# Patient Record
Sex: Female | Born: 1955 | Race: Black or African American | Hispanic: No | Marital: Married | Smoking: Never smoker
Health system: Southern US, Community
[De-identification: ages and names within clinical notes are randomized; demographics above are authoritative.]

## PROBLEM LIST (undated history)

## (undated) DIAGNOSIS — R011 Cardiac murmur, unspecified: Secondary | ICD-10-CM

## (undated) DIAGNOSIS — N189 Chronic kidney disease, unspecified: Secondary | ICD-10-CM

## (undated) DIAGNOSIS — R7303 Prediabetes: Secondary | ICD-10-CM

## (undated) DIAGNOSIS — M810 Age-related osteoporosis without current pathological fracture: Secondary | ICD-10-CM

## (undated) DIAGNOSIS — D649 Anemia, unspecified: Secondary | ICD-10-CM

## (undated) DIAGNOSIS — T7840XA Allergy, unspecified, initial encounter: Secondary | ICD-10-CM

## (undated) DIAGNOSIS — M069 Rheumatoid arthritis, unspecified: Secondary | ICD-10-CM

## (undated) HISTORY — PX: TUBAL LIGATION: SHX77

## (undated) HISTORY — DX: Age-related osteoporosis without current pathological fracture: M81.0

## (undated) HISTORY — DX: Anemia, unspecified: D64.9

## (undated) HISTORY — DX: Cardiac murmur, unspecified: R01.1

## (undated) HISTORY — DX: Allergy, unspecified, initial encounter: T78.40XA

## (undated) HISTORY — DX: Chronic kidney disease, unspecified: N18.9

---

## 1999-11-14 ENCOUNTER — Other Ambulatory Visit: Admission: RE | Admit: 1999-11-14 | Discharge: 1999-11-14 | Payer: Self-pay | Admitting: Obstetrics and Gynecology

## 2000-07-09 ENCOUNTER — Ambulatory Visit (HOSPITAL_COMMUNITY): Admission: RE | Admit: 2000-07-09 | Discharge: 2000-07-09 | Payer: Self-pay | Admitting: *Deleted

## 2000-07-09 ENCOUNTER — Encounter: Payer: Self-pay | Admitting: Rheumatology

## 2001-07-29 ENCOUNTER — Other Ambulatory Visit: Admission: RE | Admit: 2001-07-29 | Discharge: 2001-07-29 | Payer: Self-pay | Admitting: Obstetrics and Gynecology

## 2002-08-08 ENCOUNTER — Other Ambulatory Visit: Admission: RE | Admit: 2002-08-08 | Discharge: 2002-08-08 | Payer: Self-pay | Admitting: Obstetrics and Gynecology

## 2003-12-31 ENCOUNTER — Other Ambulatory Visit: Admission: RE | Admit: 2003-12-31 | Discharge: 2003-12-31 | Payer: Self-pay | Admitting: Obstetrics and Gynecology

## 2004-11-18 ENCOUNTER — Encounter: Admission: RE | Admit: 2004-11-18 | Discharge: 2004-11-18 | Payer: Self-pay | Admitting: Allergy and Immunology

## 2005-05-22 ENCOUNTER — Emergency Department (HOSPITAL_COMMUNITY): Admission: EM | Admit: 2005-05-22 | Discharge: 2005-05-22 | Payer: Self-pay | Admitting: Emergency Medicine

## 2005-09-07 ENCOUNTER — Other Ambulatory Visit: Admission: RE | Admit: 2005-09-07 | Discharge: 2005-09-07 | Payer: Self-pay | Admitting: Obstetrics and Gynecology

## 2006-11-19 ENCOUNTER — Other Ambulatory Visit: Admission: RE | Admit: 2006-11-19 | Discharge: 2006-11-19 | Payer: Self-pay | Admitting: Obstetrics and Gynecology

## 2007-11-30 ENCOUNTER — Other Ambulatory Visit: Admission: RE | Admit: 2007-11-30 | Discharge: 2007-11-30 | Payer: Self-pay | Admitting: Obstetrics and Gynecology

## 2008-02-18 ENCOUNTER — Emergency Department (HOSPITAL_COMMUNITY): Admission: EM | Admit: 2008-02-18 | Discharge: 2008-02-18 | Payer: Self-pay | Admitting: Family Medicine

## 2008-02-24 ENCOUNTER — Emergency Department (HOSPITAL_COMMUNITY): Admission: EM | Admit: 2008-02-24 | Discharge: 2008-02-25 | Payer: Self-pay | Admitting: Emergency Medicine

## 2009-06-18 ENCOUNTER — Other Ambulatory Visit: Admission: RE | Admit: 2009-06-18 | Discharge: 2009-06-18 | Payer: Self-pay | Admitting: Obstetrics and Gynecology

## 2010-04-30 ENCOUNTER — Encounter: Admission: RE | Admit: 2010-04-30 | Discharge: 2010-04-30 | Payer: Self-pay | Admitting: Allergy and Immunology

## 2011-02-06 ENCOUNTER — Other Ambulatory Visit: Payer: Self-pay | Admitting: Family Medicine

## 2011-02-06 ENCOUNTER — Ambulatory Visit
Admission: RE | Admit: 2011-02-06 | Discharge: 2011-02-06 | Disposition: A | Discharge status: Home / Self Care | Payer: BC Managed Care – PPO | Source: Ambulatory Visit | Attending: Family Medicine | Admitting: Family Medicine

## 2011-02-06 DIAGNOSIS — R109 Unspecified abdominal pain: Secondary | ICD-10-CM

## 2011-08-24 LAB — CBC
MCHC: 33
Platelets: 287
RDW: 15.2

## 2011-08-24 LAB — DIFFERENTIAL
Basophils Absolute: 0
Basophils Relative: 0
Eosinophils Relative: 5
Lymphocytes Relative: 15
Monocytes Absolute: 0.5
Neutro Abs: 6.1

## 2011-08-24 LAB — BASIC METABOLIC PANEL
BUN: 9
Chloride: 106
Creatinine, Ser: 0.72
GFR calc Af Amer: >60
GFR calc non Af Amer: >60
Potassium: 3.9

## 2011-08-24 LAB — POCT RAPID STREP A: Streptococcus, Group A Screen (Direct): POSITIVE — AB

## 2011-09-11 ENCOUNTER — Other Ambulatory Visit: Payer: Self-pay | Admitting: Obstetrics and Gynecology

## 2011-09-11 ENCOUNTER — Other Ambulatory Visit (HOSPITAL_COMMUNITY)
Admission: RE | Admit: 2011-09-11 | Discharge: 2011-09-11 | Disposition: A | Discharge status: Home / Self Care | Payer: BC Managed Care – PPO | Source: Ambulatory Visit | Attending: Obstetrics and Gynecology | Admitting: Obstetrics and Gynecology

## 2011-09-11 DIAGNOSIS — Z01419 Encounter for gynecological examination (general) (routine) without abnormal findings: Secondary | ICD-10-CM | POA: Insufficient documentation

## 2012-09-12 ENCOUNTER — Other Ambulatory Visit (HOSPITAL_COMMUNITY)
Admission: RE | Admit: 2012-09-12 | Discharge: 2012-09-12 | Disposition: A | Discharge status: Home / Self Care | Payer: BC Managed Care – PPO | Source: Ambulatory Visit | Attending: Obstetrics and Gynecology | Admitting: Obstetrics and Gynecology

## 2012-09-12 ENCOUNTER — Other Ambulatory Visit: Payer: Self-pay | Admitting: Obstetrics and Gynecology

## 2012-09-12 DIAGNOSIS — Z01419 Encounter for gynecological examination (general) (routine) without abnormal findings: Secondary | ICD-10-CM | POA: Insufficient documentation

## 2013-09-20 ENCOUNTER — Other Ambulatory Visit (HOSPITAL_COMMUNITY)
Admission: RE | Admit: 2013-09-20 | Discharge: 2013-09-20 | Disposition: A | Discharge status: Home / Self Care | Payer: BC Managed Care – PPO | Source: Ambulatory Visit | Attending: Obstetrics and Gynecology | Admitting: Obstetrics and Gynecology

## 2013-09-20 ENCOUNTER — Other Ambulatory Visit: Payer: Self-pay | Admitting: Obstetrics and Gynecology

## 2013-09-20 DIAGNOSIS — Z1151 Encounter for screening for human papillomavirus (HPV): Secondary | ICD-10-CM | POA: Insufficient documentation

## 2013-09-20 DIAGNOSIS — Z01419 Encounter for gynecological examination (general) (routine) without abnormal findings: Secondary | ICD-10-CM | POA: Insufficient documentation

## 2014-04-01 ENCOUNTER — Encounter (HOSPITAL_COMMUNITY): Payer: Self-pay | Admitting: Emergency Medicine

## 2014-04-01 ENCOUNTER — Emergency Department (HOSPITAL_COMMUNITY)
Admission: EM | Admit: 2014-04-01 | Discharge: 2014-04-01 | Disposition: A | Discharge status: Home / Self Care | Payer: BC Managed Care – PPO | Source: Home / Self Care | Attending: Family Medicine | Admitting: Family Medicine

## 2014-04-01 DIAGNOSIS — J40 Bronchitis, not specified as acute or chronic: Secondary | ICD-10-CM

## 2014-04-01 DIAGNOSIS — I889 Nonspecific lymphadenitis, unspecified: Secondary | ICD-10-CM

## 2014-04-01 HISTORY — DX: Rheumatoid arthritis, unspecified: M06.9

## 2014-04-01 MED ORDER — SODIUM CHLORIDE 0.9 % IN NEBU
INHALATION_SOLUTION | RESPIRATORY_TRACT | Status: AC
  Filled 2014-04-01: qty 3

## 2014-04-01 MED ORDER — PREDNISONE 10 MG PO TABS: 30.0000 mg | ORAL_TABLET | Freq: Every day | ORAL | Status: DC

## 2014-04-01 MED ORDER — IPRATROPIUM-ALBUTEROL 0.5-2.5 (3) MG/3ML IN SOLN
RESPIRATORY_TRACT | Status: AC
  Filled 2014-04-01: qty 3

## 2014-04-01 MED ORDER — CLINDAMYCIN HCL 300 MG PO CAPS: 300.0000 mg | ORAL_CAPSULE | Freq: Three times a day (TID) | ORAL | Status: DC

## 2014-04-01 MED ORDER — ALBUTEROL SULFATE (2.5 MG/3ML) 0.083% IN NEBU: 2.5000 mg | INHALATION_SOLUTION | Freq: Four times a day (QID) | RESPIRATORY_TRACT | Status: AC | PRN

## 2014-04-01 MED ORDER — IPRATROPIUM-ALBUTEROL 0.5-2.5 (3) MG/3ML IN SOLN
3.0000 mL | Freq: Once | RESPIRATORY_TRACT | Status: AC
  Administered 2014-04-01: 3 mL via RESPIRATORY_TRACT

## 2014-04-01 NOTE — ED Provider Notes (Signed)
Taylor Kirby is a 58 y.o. female who presents to Urgent Care today for sore throat left ear pain wheezing and shortness of breath. This is associated with a mild productive cough. Symptoms present for about one week. Patient has tried albuterol nebulizer which helps some. Additionally she's tried ibuprofen which helps a little. She denies any fevers or chills nausea vomiting or diarrhea.   Past Medical History  Diagnosis Date  . Rheumatoid arthritis    History  Substance Use Topics  . Smoking status: Never Smoker   . Smokeless tobacco: Not on file  . Alcohol Use: No   ROS as above Medications: No current facility-administered medications for this encounter.   Current Outpatient Prescriptions  Medication Sig Dispense Refill  . etanercept (ENBREL) 50 MG/ML injection Inject 50 mg into the skin once a week.      Marland Kitchen FOLIC ACID PO Take by mouth.      . Methotrexate Sodium (METHOTREXATE PO) Take by mouth.      Marland Kitchen albuterol (PROVENTIL) (2.5 MG/3ML) 0.083% nebulizer solution Take 3 mLs (2.5 mg total) by nebulization every 6 (six) hours as needed for wheezing or shortness of breath.  75 mL  0  . clindamycin (CLEOCIN) 300 MG capsule Take 1 capsule (300 mg total) by mouth 3 (three) times daily.  30 capsule  0  . predniSONE (DELTASONE) 10 MG tablet Take 3 tablets (30 mg total) by mouth daily.  15 tablet  0    Exam:  BP 130/90  Pulse 88  Temp(Src) 98.6 F (37 C) (Oral)  Resp 17  SpO2 100% Gen: Well NAD HEENT: EOMI,  MMM left tender anterior cervical lymph nodes. Posterior pharynx with cobblestoning. Normal tympanic membranes bilaterally Lungs: Normal work of breathing. Wheezing bilaterally Heart: RRR no MRG Abd: NABS, Soft. NT, ND Exts: Brisk capillary refill, warm and well perfused.   Patient was given a DuoNeb nebulizer treatment and felt much better.  No results found for this or any previous visit (from the past 24 hour(s)). No results found.  Assessment and Plan: 58 y.o.  female with  1) asthmatic bronchitis. Plan to treat with albuterol and prednisone 2) lymphadenitis. Plan to treat with clindamycin  Discussed warning signs or symptoms. Please see discharge instructions. Patient expresses understanding.    Gregor Hams, MD 04/01/14 [phone]

## 2014-04-01 NOTE — ED Notes (Signed)
Breathing treatment complete.  States feeling much better.

## 2014-04-01 NOTE — ED Notes (Signed)
C/O voice hoarseness and left earache.  Assessment per Dr. Georgina Snell.

## 2014-10-08 ENCOUNTER — Other Ambulatory Visit: Payer: Self-pay | Admitting: Obstetrics and Gynecology

## 2014-10-08 ENCOUNTER — Other Ambulatory Visit (HOSPITAL_COMMUNITY)
Admission: RE | Admit: 2014-10-08 | Discharge: 2014-10-08 | Disposition: A | Discharge status: Home / Self Care | Payer: BC Managed Care – PPO | Source: Ambulatory Visit | Attending: Obstetrics and Gynecology | Admitting: Obstetrics and Gynecology

## 2014-10-08 DIAGNOSIS — Z01419 Encounter for gynecological examination (general) (routine) without abnormal findings: Secondary | ICD-10-CM | POA: Insufficient documentation

## 2014-10-10 LAB — CYTOLOGY - PAP

## 2015-01-27 ENCOUNTER — Emergency Department (HOSPITAL_COMMUNITY)
Admission: EM | Admit: 2015-01-27 | Discharge: 2015-01-27 | Disposition: A | Discharge status: Home / Self Care | Payer: BC Managed Care – PPO | Source: Home / Self Care | Attending: Family Medicine | Admitting: Family Medicine

## 2015-01-27 ENCOUNTER — Encounter (HOSPITAL_COMMUNITY): Payer: Self-pay | Admitting: Emergency Medicine

## 2015-01-27 DIAGNOSIS — J00 Acute nasopharyngitis [common cold]: Secondary | ICD-10-CM

## 2015-01-27 MED ORDER — BENZONATATE 100 MG PO CAPS: 100.0000 mg | ORAL_CAPSULE | Freq: Three times a day (TID) | ORAL | Status: DC | PRN

## 2015-01-27 MED ORDER — IPRATROPIUM BROMIDE 0.06 % NA SOLN: 2.0000 | Freq: Four times a day (QID) | NASAL | Status: DC

## 2015-01-27 NOTE — Discharge Instructions (Signed)
Upper Respiratory Infection, Adult An upper respiratory infection (URI) is also sometimes known as the common cold. The upper respiratory tract includes the nose, sinuses, throat, trachea, and bronchi. Bronchi are the airways leading to the lungs. Most people improve within 1 week, but symptoms can last up to 2 weeks. A residual cough may last even longer.  CAUSES Many different viruses can infect the tissues lining the upper respiratory tract. The tissues become irritated and inflamed and often become very moist. Mucus production is also common. A cold is contagious. You can easily spread the virus to others by oral contact. This includes kissing, sharing a glass, coughing, or sneezing. Touching your mouth or nose and then touching a surface, which is then touched by another person, can also spread the virus. SYMPTOMS  Symptoms typically develop 1 to 3 days after you come in contact with a cold virus. Symptoms vary from person to person. They may include:  Runny nose.  Sneezing.  Nasal congestion.  Sinus irritation.  Sore throat.  Loss of voice (laryngitis).  Cough.  Fatigue.  Muscle aches.  Loss of appetite.  Headache.  Low-grade fever. DIAGNOSIS  You might diagnose your own cold based on familiar symptoms, since most people get a cold 2 to 3 times a year. Your caregiver can confirm this based on your exam. Most importantly, your caregiver can check that your symptoms are not due to another disease such as strep throat, sinusitis, pneumonia, asthma, or epiglottitis. Blood tests, throat tests, and X-rays are not necessary to diagnose a common cold, but they may sometimes be helpful in excluding other more serious diseases. Your caregiver will decide if any further tests are required. RISKS AND COMPLICATIONS  You may be at risk for a more severe case of the common cold if you smoke cigarettes, have chronic heart disease (such as heart failure) or lung disease (such as asthma), or if  you have a weakened immune system. The very young and very old are also at risk for more serious infections. Bacterial sinusitis, middle ear infections, and bacterial pneumonia can complicate the common cold. The common cold can worsen asthma and chronic obstructive pulmonary disease (COPD). Sometimes, these complications can require emergency medical care and may be life-threatening. PREVENTION  The best way to protect against getting a cold is to practice good hygiene. Avoid oral or hand contact with people with cold symptoms. Wash your hands often if contact occurs. There is no clear evidence that vitamin C, vitamin E, echinacea, or exercise reduces the chance of developing a cold. However, it is always recommended to get plenty of rest and practice good nutrition. TREATMENT  Treatment is directed at relieving symptoms. There is no cure. Antibiotics are not effective, because the infection is caused by a virus, not by bacteria. Treatment may include:  Increased fluid intake. Sports drinks offer valuable electrolytes, sugars, and fluids.  Breathing heated mist or steam (vaporizer or shower).  Eating chicken soup or other clear broths, and maintaining good nutrition.  Getting plenty of rest.  Using gargles or lozenges for comfort.  Controlling fevers with ibuprofen or acetaminophen as directed by your caregiver.  Increasing usage of your inhaler if you have asthma. Zinc gel and zinc lozenges, taken in the first 24 hours of the common cold, can shorten the duration and lessen the severity of symptoms. Pain medicines may help with fever, muscle aches, and throat pain. A variety of non-prescription medicines are available to treat congestion and runny nose. Your caregiver   can make recommendations and may suggest nasal or lung inhalers for other symptoms.  HOME CARE INSTRUCTIONS   Only take over-the-counter or prescription medicines for pain, discomfort, or fever as directed by your  caregiver.  Use a warm mist humidifier or inhale steam from a shower to increase air moisture. This may keep secretions moist and make it easier to breathe.  Drink enough water and fluids to keep your urine clear or pale yellow.  Rest as needed.  Return to work when your temperature has returned to normal or as your caregiver advises. You may need to stay home longer to avoid infecting others. You can also use a face mask and careful hand washing to prevent spread of the virus. SEEK MEDICAL CARE IF:   After the first few days, you feel you are getting worse rather than better.  You need your caregiver's advice about medicines to control symptoms.  You develop chills, worsening shortness of breath, or brown or red sputum. These may be signs of pneumonia.  You develop yellow or brown nasal discharge or pain in the face, especially when you bend forward. These may be signs of sinusitis.  You develop a fever, swollen neck glands, pain with swallowing, or white areas in the back of your throat. These may be signs of strep throat. SEEK IMMEDIATE MEDICAL CARE IF:   You have a fever.  You develop severe or persistent headache, ear pain, sinus pain, or chest pain.  You develop wheezing, a prolonged cough, cough up blood, or have a change in your usual mucus (if you have chronic lung disease).  You develop sore muscles or a stiff neck. Document Released: 05/12/2001 Document Revised: 02/08/2012 Document Reviewed: 02/21/2014 ExitCare Patient Information 2015 [employer]. This information is not intended to replace advice given to you by your health care provider. Make sure you discuss any questions you have with your health care provider.  

## 2015-01-27 NOTE — ED Notes (Signed)
Chronic sinus issues, but is concerned cold is moving into chest.  C/o coughing and wheezing.  History of bronchitis.  Onset Friday 2/26.

## 2015-01-27 NOTE — ED Provider Notes (Signed)
CSN: [id number]     Arrival date & time 01/27/15  0904 History   First MD Initiated Contact with Patient 01/27/15 [phone]     Chief Complaint  Patient presents with  . 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 female presenting with URI. The history is provided by the patient.  URI Presenting symptoms: congestion, cough and rhinorrhea   Presenting symptoms: no ear pain, no facial pain, no fatigue, no fever and no sore throat   Presenting symptoms comment:  +post nasal drainage Severity:  Mild Onset quality:  Gradual Duration:  2 days Timing:  Constant Progression:  Unchanged Chronicity:  New   Past Medical History  Diagnosis Date  . Rheumatoid arthritis    Past Surgical History  Procedure Laterality Date  . Tubal ligation     No family history on file. History  Substance Use Topics  . Smoking status: Never Smoker   . Smokeless tobacco: Not on file  . Alcohol Use: No   OB History    No data available     Review of Systems  Constitutional: Negative for fever and fatigue.  HENT: Positive for congestion and rhinorrhea. Negative for ear pain and sore throat.   Eyes: Negative.   Respiratory: Positive for cough.   Cardiovascular: Negative.   Skin: Negative.     Allergies  Penicillins and Sulfa antibiotics  Home Medications   Prior to Admission medications   Medication Sig Start Date End Date Taking? Authorizing Provider  albuterol (PROVENTIL) (2.5 MG/3ML) 0.083% nebulizer solution Take 3 mLs (2.5 mg total) by nebulization every 6 (six) hours as needed for wheezing or shortness of breath. 04/01/14   Gregor Hams, MD  benzonatate (TESSALON) 100 MG capsule Take 1 capsule (100 mg total) by mouth 3 (three) times daily as needed for cough. 01/27/15   Audelia Hives Ranada Vigorito, PA  clindamycin (CLEOCIN) 300 MG capsule Take  1 capsule (300 mg total) by mouth 3 (three) times daily. 04/01/14   Gregor Hams, MD  etanercept (ENBREL) 50 MG/ML injection Inject 50 mg into the skin once a week.    Historical Provider, MD  FOLIC ACID PO Take by mouth.    Historical Provider, MD  ipratropium (ATROVENT) 0.06 % nasal spray Place 2 sprays into both nostrils 4 (four) times daily. 01/27/15   Audelia Hives Elizibeth Breau, PA  Methotrexate Sodium (METHOTREXATE PO) Take by mouth.    Historical Provider, MD  predniSONE (DELTASONE) 10 MG tablet Take 3 tablets (30 mg total) by mouth daily. Patient not taking: Reported on 01/27/2015 04/01/14   Gregor Hams, MD   BP 123/81 mmHg  Pulse 81  Temp(Src) 98 F (36.7 C) (Oral)  Resp 16  SpO2 99% Physical Exam  Constitutional: She is oriented to person, place, and time. She appears well-developed and well-nourished. No distress.  HENT:  Head: Normocephalic and atraumatic.  Right Ear: Hearing, tympanic membrane, external ear and ear canal normal.  Left Ear: Hearing, tympanic membrane, external ear and ear canal normal.  Nose: Nose normal.  Mouth/Throat: Uvula is midline, oropharynx is clear and moist and mucous membranes are normal.  Eyes: Conjunctivae are normal. No scleral icterus.  Neck: Normal range of motion. Neck supple.  Cardiovascular: Normal rate, regular rhythm and normal heart sounds.   Pulmonary/Chest: Effort normal and breath sounds normal. No respiratory distress. She has no wheezes.  Musculoskeletal: Normal range  of motion.  Lymphadenopathy:    She has no cervical adenopathy.  Neurological: She is alert and oriented to person, place, and time.  Skin: Skin is warm and dry. No rash noted. No erythema.  Psychiatric: She has a normal mood and affect. Her behavior is normal.  Nursing note and vitals reviewed.   ED Course  Procedures (including critical care time) Labs Review Labs Reviewed - No data to display  Imaging Review No results found.   MDM   1. Common cold    symptomatic care at home Tessalon atrovent nasal spray  Follow up prn with PCP    Lutricia Feil, PA 01/27/15 1005

## 2015-10-14 ENCOUNTER — Other Ambulatory Visit: Payer: Self-pay | Admitting: Obstetrics and Gynecology

## 2015-10-14 ENCOUNTER — Other Ambulatory Visit (HOSPITAL_COMMUNITY)
Admission: RE | Admit: 2015-10-14 | Discharge: 2015-10-14 | Disposition: A | Discharge status: Home / Self Care | Payer: BC Managed Care – PPO | Source: Ambulatory Visit | Attending: Obstetrics and Gynecology | Admitting: Obstetrics and Gynecology

## 2015-10-14 DIAGNOSIS — Z01419 Encounter for gynecological examination (general) (routine) without abnormal findings: Secondary | ICD-10-CM | POA: Diagnosis present

## 2015-10-15 LAB — CYTOLOGY - PAP

## 2015-10-25 ENCOUNTER — Ambulatory Visit (INDEPENDENT_AMBULATORY_CARE_PROVIDER_SITE_OTHER): Payer: BC Managed Care – PPO | Admitting: Family Medicine

## 2015-10-25 VITALS — BP 104/62 | HR 78 | Temp 98.6°F | Resp 18 | Ht 66.0 in | Wt 129.0 lb

## 2015-10-25 DIAGNOSIS — L4050 Arthropathic psoriasis, unspecified: Secondary | ICD-10-CM | POA: Insufficient documentation

## 2015-10-25 DIAGNOSIS — K1120 Sialoadenitis, unspecified: Secondary | ICD-10-CM

## 2015-10-25 DIAGNOSIS — S29019A Strain of muscle and tendon of unspecified wall of thorax, initial encounter: Secondary | ICD-10-CM | POA: Diagnosis not present

## 2015-10-25 NOTE — Patient Instructions (Signed)
Can also suck on sour candies Come back in to see [place] if you develop a fever or swelling or more pain.   Parotitis Parotitis is soreness and inflammation of one or both parotid glands. The parotid glands produce saliva. They are located on each side of the face, below and in front of the earlobes. The saliva produced comes out of tiny openings (ducts) inside the cheeks. In most cases, parotitis goes away over time or with treatment. If your parotitis is caused by certain long-term (chronic) diseases, it may come back again.  CAUSES  Parotitis can be caused by:  Viral infections. Mumps is one viral infection that can cause parotitis.  Bacterial infections.  Blockage of the salivary ducts due to a salivary stone.  Narrowing of the salivary ducts.  Swelling of the salivary ducts.  Dehydration.  Autoimmune conditions, such as sarcoidosis or Sjogren syndrome.  Air from activities such as scuba diving, glass blowing, or playing an instrument (rare).  Human immunodeficiency virus (HIV) or acquired immunodeficiency syndrome (AIDS).  Tuberculosis. SIGNS AND SYMPTOMS   The ears may appear to be pushed up and out from their normal position.  Redness (erythema) of the skin over the parotid glands.  Pain and tenderness over the parotid glands.  Swelling in the parotid gland area.  Yellowish-white fluid (pus) coming from the ducts inside the cheeks.  Dry mouth.  Bad taste in the mouth. DIAGNOSIS  Your health care provider may determine that you have parotitis based on your symptoms and a physical exam. A sample of fluid may also be taken from the parotid gland and tested to find the cause of your infection. X-rays or computed tomography (CT) scans may be taken if your health care provider thinks you might have a salivary stone blocking your salivary duct. TREATMENT  Treatment varies depending upon the cause of your parotitis. If your parotitis is caused by mumps, no treatment is needed.  The condition will go away on its own after 7 to 10 days. In other cases, treatment may include:  Antibiotic medicine if your infection was caused by bacteria.  Pain medicines.  Gland massage.  Eating sour candy to increase your saliva production.  Removal of salivary stones. Your health care provider may flush stones out with fluids or remove them with tweezers.  Surgery to remove the parotid glands. HOME CARE INSTRUCTIONS   If you were prescribed an antibiotic medicine, finish it all even if you start to feel better.  Put warm compresses on the sore area.  Take medicines only as directed by your health care provider.  Drink enough fluids to keep your urine clear or pale yellow. SEEK IMMEDIATE MEDICAL CARE IF:   You have increasing pain or swelling that is not controlled with medicine.  You have a fever. MAKE SURE YOU:  Understand these instructions.  Will watch your condition.  Will get help right away if you are not doing well or get worse.   This information is not intended to replace advice given to you by your health care provider. Make sure you discuss any questions you have with your health care provider.   Document Released: 05/08/2002 Document Revised: 12/07/2014 Document Reviewed: 04/11/2015 Elsevier Interactive Patient Education [employer].

## 2015-10-25 NOTE — Progress Notes (Signed)
Subjective:    Patient ID: Taylor Kirby, female    DOB: 1956/07/02, 59 y.o.   MRN: [id number]  HPI  This is a pleasant 59 yo female who presents today with right sided flank pain for 1 week. It is not always in the same place. The pain is intermittent and throbbing, improvement with tylenol, heat and stretching. Yesterday was particularly painful and she had some tylenol with good relief. 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  Not as painful today and has not needed pain medication. No known trauma, no falls. Has had kidney stones in the past, this does not feel like that. No hematuria. No fevers.   Has had a scratchy throat and loss of voice for a couple of days. She had some pain in front of her left ear. She has noticed a bad taste and fluid in her mouth. She felt some swelling under her neck which has resolved.   Sees Dr. Humberto Seals every 6 weeks for her psoriatic arthritis. Had recent labs which were normal.   Past Medical History  Diagnosis Date  . Rheumatoid arthritis ([place])   . Allergy   . Anemia   . Heart murmur    Past Surgical History  Procedure Laterality Date  . Tubal ligation     Family History  Problem Relation Age of Onset  . Hyperlipidemia Mother   . Hypertension Mother   . Stroke Mother   . Hyperlipidemia Father   . Cancer Father   . Hypertension Sister   . Heart disease Maternal Grandmother   . Cancer Maternal Grandfather   . Hypertension Paternal Grandmother    Social History  Substance Use Topics  . Smoking status: Never Smoker   . Smokeless tobacco: None  . Alcohol Use: No   Review of Systems No fever or chills, no hematuria, no dysuria, no frequency. No ear pain, no cough.     Objective:   Physical Exam  Constitutional: She is oriented to person, place, and time. She appears well-developed and well-nourished.  thin  HENT:  Head: Normocephalic and atraumatic.  Right Ear: External ear  normal.  Left Ear: External ear normal.  Nose: Nose normal.  Mouth/Throat: Uvula is midline and mucous membranes are normal. No oropharyngeal exudate, posterior oropharyngeal edema or posterior oropharyngeal erythema.  Left parotid gland opening with small amount white discharge. No swelling.   Eyes: Conjunctivae are normal.  Neck: Normal range of motion. Neck supple.  Cardiovascular: Normal rate and regular rhythm.   Pulmonary/Chest: Effort normal.  Musculoskeletal: Normal range of motion.  Right lateral rib edge tender to palpation, no pain with twisting.   Lymphadenopathy:    She has no cervical adenopathy.  Neurological: She is alert and oriented to person, place, and time.  Skin: Skin is warm and dry.  Psychiatric: She has a normal mood and affect. Her behavior is normal. Judgment and thought content normal.  Vitals reviewed.  BP 104/62 mmHg  Pulse 78  Temp(Src) 98.6 F (37 C) (Oral)  Resp 18  Ht 5\' 6"  (1.676 m)  Wt 129 lb (58.514 kg)  BMI 20.83 kg/m2  SpO2 98%     Assessment & Plan:  1. Parotitis - this is draining and she is afebrile and swelling has gone down.  - Provided written and verbal information regarding diagnosis and treatment. - RTC precautions- fever, increased swelling or pain  2. Strain of muscle of torso, initial encounter -  Improved today, continue symptomatic treatment- Tylenol, heat, gentle ROM - RTC if she develops worsening pain or urinary symptoms   Clarene Reamer, FNP-BC  [facility]  10/25/2015 5:56 PM

## 2015-10-26 ENCOUNTER — Ambulatory Visit: Payer: BC Managed Care – PPO

## 2016-02-29 ENCOUNTER — Encounter (HOSPITAL_COMMUNITY): Payer: Self-pay | Admitting: Emergency Medicine

## 2016-02-29 ENCOUNTER — Emergency Department (HOSPITAL_COMMUNITY)
Admission: EM | Admit: 2016-02-29 | Discharge: 2016-02-29 | Disposition: A | Discharge status: Home / Self Care | Payer: BC Managed Care – PPO | Attending: Emergency Medicine | Admitting: Emergency Medicine

## 2016-02-29 ENCOUNTER — Emergency Department (HOSPITAL_COMMUNITY): Payer: BC Managed Care – PPO

## 2016-02-29 DIAGNOSIS — D649 Anemia, unspecified: Secondary | ICD-10-CM | POA: Diagnosis not present

## 2016-02-29 DIAGNOSIS — R059 Cough, unspecified: Secondary | ICD-10-CM

## 2016-02-29 DIAGNOSIS — R011 Cardiac murmur, unspecified: Secondary | ICD-10-CM | POA: Insufficient documentation

## 2016-02-29 DIAGNOSIS — J069 Acute upper respiratory infection, unspecified: Secondary | ICD-10-CM | POA: Diagnosis not present

## 2016-02-29 DIAGNOSIS — Z880 Allergy status to penicillin: Secondary | ICD-10-CM | POA: Diagnosis not present

## 2016-02-29 DIAGNOSIS — R05 Cough: Secondary | ICD-10-CM | POA: Diagnosis present

## 2016-02-29 DIAGNOSIS — Z79899 Other long term (current) drug therapy: Secondary | ICD-10-CM | POA: Insufficient documentation

## 2016-02-29 DIAGNOSIS — Z8739 Personal history of other diseases of the musculoskeletal system and connective tissue: Secondary | ICD-10-CM | POA: Diagnosis not present

## 2016-02-29 MED ORDER — BENZONATATE 100 MG PO CAPS: 100.0000 mg | ORAL_CAPSULE | Freq: Three times a day (TID) | ORAL | Status: DC

## 2016-02-29 NOTE — ED Notes (Signed)
Pt complaint of nonproductive cough unrelieved by Musinex onset Wednesday.

## 2016-02-29 NOTE — ED Provider Notes (Signed)
CSN: [id number]     Arrival date & time 02/29/16  1511 History  By signing my name below, I, Taylor Kirby, attest that this documentation has been prepared under the direction and in the presence of non-physician practitioner, Delrae Rend, PA-C. Electronically Signed: Dora Kirby, Scribe. 02/29/2016. 3:33 PM.   Chief Complaint  Patient presents with  . Cough    The history is provided by the patient. No language interpreter was used.     HPI Comments: Taylor Kirby is a 60 y.o. female with h/o RA who presents to the Emergency Department complaining of sudden onset, constant, URI symptoms for the past five days. Pt endorses dry cough, chest tightness, chest congestion, nasal congestion, throat drainage, myalgias, fever, and sore throat. She notes that her fever and nasal congestion have ceased; her fever ended a few days ago and she used OTC nasal spray today with relief of her nasal congestion. Pt notes that she went to a walk-in clinic five days ago and had a fever of 100.7 measured; she was given amoxicillin and had a chest x-ray taken. Pt has finished her amoxicillin prescription. Pt was also given Tamiflu at the same clinic two weeks ago. Pt has been taking ibuprofen and Tylenol for her symptoms; her last use of either was two days ago. She denies chest pain, SOB, abd pain, vomiting, or any other associated symptoms.  Past Medical History  Diagnosis Date  . Rheumatoid arthritis ([place])   . Allergy   . Anemia   . Heart murmur    Past Surgical History  Procedure Laterality Date  . Tubal ligation     Family History  Problem Relation Age of Onset  . Hyperlipidemia Mother   . Hypertension Mother   . Stroke Mother   . Hyperlipidemia Father   . Cancer Father   . Hypertension Sister   . Heart disease Maternal Grandmother   . Cancer Maternal Grandfather   . Hypertension Paternal Grandmother    Social History  Substance Use Topics  . Smoking status: Never Smoker   . Smokeless  tobacco: None  . Alcohol Use: No   OB History    No data available     Review of Systems  Constitutional: Positive for fever.  HENT: Positive for congestion, postnasal drip and sore throat.   Respiratory: Positive for cough and chest tightness. Negative for shortness of breath.   Gastrointestinal: Negative for vomiting.  Musculoskeletal: Positive for myalgias.  All other systems reviewed and are negative.     Allergies  Penicillins and Sulfa antibiotics  Home Medications   Prior to Admission medications   Medication Sig Start Date End Date Taking? Authorizing Provider  albuterol (PROVENTIL) (2.5 MG/3ML) 0.083% nebulizer solution Take 3 mLs (2.5 mg total) by nebulization every 6 (six) hours as needed for wheezing or shortness of breath. Patient not taking: Reported on 10/25/2015 04/01/14   Gregor Hams, MD  etanercept (ENBREL) 50 MG/ML injection Inject 50 mg into the skin once a week.    Historical Provider, MD  folic acid (FOLVITE) 1 MG tablet Take 1 mg by mouth daily.    Historical Provider, MD  methotrexate (RHEUMATREX) 2.5 MG tablet Take 2.5 mg by mouth once a week. Caution:Chemotherapy. Protect from light.    Historical Provider, MD   BP 118/81 mmHg  Pulse 97  Temp(Src) 98.1 F (36.7 C) (Oral)  Resp 18  SpO2 100% Physical Exam  Constitutional: She is oriented to person, place, and time. She appears well-developed  and well-nourished. No distress.  HENT:  Head: Normocephalic and atraumatic.  Right Ear: External ear normal.  Left Ear: External ear normal.  Nose: Nose normal.  Mouth/Throat: Oropharynx is clear and moist. No oropharyngeal exudate.  Eyes: Conjunctivae and EOM are normal. Pupils are equal, round, and reactive to light.  Neck: Normal range of motion. Neck supple. No tracheal deviation present.  Cardiovascular: Normal rate, regular rhythm and normal heart sounds.   Pulmonary/Chest: Effort normal and breath sounds normal. No respiratory distress. She has no  wheezes. She has no rales.  Abdominal: Soft. Bowel sounds are normal. She exhibits no distension. There is no tenderness.  Musculoskeletal: Normal range of motion.  Lymphadenopathy:    She has no cervical adenopathy.  Neurological: She is alert and oriented to person, place, and time.  Skin: Skin is warm and dry.  Psychiatric: She has a normal mood and affect. Her behavior is normal.  Nursing note and vitals reviewed.   ED Course  Procedures (including critical care time)  DIAGNOSTIC STUDIES: Oxygen Saturation is 100% on RA, normal by my interpretation.    COORDINATION OF CARE: 3:38 PM Will order DG Chest 2 View. Discussed treatment plan with pt at bedside and pt agreed to plan.  Labs Review Labs Reviewed - No data to display  Imaging Review Dg Chest 2 View  02/29/2016  CLINICAL DATA:  Cough. EXAM: CHEST  2 VIEW COMPARISON:  February 24, 2016. FINDINGS: The heart size and mediastinal contours are within normal limits. Both lungs are clear. The visualized skeletal structures are unremarkable. IMPRESSION: No active cardiopulmonary disease. Electronically Signed   By: Marijo Conception, M.D.   On: 02/29/2016 16:22   I have personally reviewed and evaluated these images as part of my medical decision-making.   EKG Interpretation None      MDM   Final diagnoses:  URI (upper respiratory infection)  Cough    CXR negative. Suspect viral etiology. Given recent abx, no pneumonia on x-ray, no fever or adventitious lung sounds, will hold off on another abx at this time. Rx given for tessalon. Instructed to continue Zyrtec and Mucinex at home. Instructed to f/u with PCP and rheumatologist next week. ER return precautions given.   I personally performed the services described in this documentation, which was scribed in my presence. The recorded information has been reviewed and is accurate.   Anne Ng, PA-C 02/29/16 [place], MD 03/01/16 [phone]

## 2016-02-29 NOTE — Discharge Instructions (Signed)
Your chest x-ray was normal today. You likely have a virus. I will hold off on prescribing anymore antibiotics at this time. I will give you a prescription for a non-drowsy cough medicine to try. As we discussed, continue taking Zyrtec and Mucinex that you have at home to help with your congestion and drainage. Please follow up with your primary care and rheumatologist within one week. Return to the ER for new or worsening symptoms.

## 2016-02-29 NOTE — ED Notes (Signed)
Pt reports understanding of discharge information. No questions at time of discharge 

## 2016-09-30 ENCOUNTER — Other Ambulatory Visit: Payer: Self-pay | Admitting: Rheumatology

## 2016-09-30 NOTE — Telephone Encounter (Signed)
Last visit 05/05/16 Next visit 10/06/16 Labs 07/03/16 WNL TB neg 03/2016  Ok to refill per Dr Estanislado Pandy

## 2016-10-03 DIAGNOSIS — Z79899 Other long term (current) drug therapy: Secondary | ICD-10-CM | POA: Insufficient documentation

## 2016-10-05 DIAGNOSIS — N29 Other disorders of kidney and ureter in diseases classified elsewhere: Secondary | ICD-10-CM

## 2016-10-05 DIAGNOSIS — D649 Anemia, unspecified: Secondary | ICD-10-CM | POA: Insufficient documentation

## 2016-10-05 DIAGNOSIS — M24521 Contracture, right elbow: Secondary | ICD-10-CM | POA: Insufficient documentation

## 2016-10-05 DIAGNOSIS — M8580 Other specified disorders of bone density and structure, unspecified site: Secondary | ICD-10-CM | POA: Insufficient documentation

## 2016-10-05 NOTE — Progress Notes (Signed)
*IMAGE* Office Visit Note  Patient: Taylor Kirby             Date of Birth: 1956/05/27           MRN: [id number]             PCP: Harlan Stains M.D. Referring: No ref. provider found Visit Date: 10/06/2016 Occupation: Retired Pharmacist, hospital    Subjective:  Follow-up on psoriatic arthritis  History of Present Illness: Taylor Kirby is a 60 y.o. female with psoriatic arthritis. She states she has no joint pain and no joint swelling. She's been tolerating her medications well she is on Enbrel once a week.  Activities of Daily Living:  Patient reports morning stiffness for 0 minutes.   Patient Denies nocturnal pain.  Difficulty dressing/grooming: Denies Difficulty climbing stairs: Denies Difficulty getting out of chair: Denies Difficulty using hands for taps, buttons, cutlery, and/or writing: Denies   Review of Systems  Constitutional: Negative for fatigue, night sweats, weight gain, weight loss and weakness.  HENT: Negative for mouth sores, trouble swallowing, trouble swallowing, mouth dryness and nose dryness.   Eyes: Negative for pain, redness, visual disturbance and dryness.  Respiratory: Negative for cough, shortness of breath and difficulty breathing.   Cardiovascular: Negative for chest pain, palpitations, hypertension, irregular heartbeat and swelling in legs/feet.  Gastrointestinal: Negative for blood in stool, constipation and diarrhea.  Endocrine: Negative for increased urination.  Genitourinary: Negative for vaginal dryness.  Musculoskeletal: Negative for arthralgias, joint pain, joint swelling, myalgias, muscle weakness, morning stiffness, muscle tenderness and myalgias.  Skin: Negative for color change, rash, hair loss, skin tightness, ulcers and sensitivity to sunlight.  Allergic/Immunologic: Negative for susceptible to infections.  Neurological: Negative for dizziness, memory loss and night sweats.  Hematological: Negative for swollen glands.  Psychiatric/Behavioral:  Negative for depressed mood and sleep disturbance. The patient is not nervous/anxious.     PMFS History:  Patient Active Problem List   Diagnosis Date Noted  . 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 Anemia 10/05/2016  . Osteopenia 10/05/2016  . 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 High risk medication use 10/03/2016  . Psoriatic arthritis ([place]) 10/25/2015    Past Medical History:  Diagnosis Date  . Allergy   . Anemia   . Heart murmur   . Rheumatoid arthritis ([place])     Family History  Problem Relation Age of Onset  . Hyperlipidemia Mother   . Hypertension Mother   . Stroke Mother   . Hyperlipidemia Father   . Cancer Father   . Hypertension Sister   . Heart disease Maternal Grandmother   . Cancer Maternal Grandfather   . Hypertension Paternal Grandmother    Past Surgical History:  Procedure Laterality Date  . TUBAL LIGATION     Social History   Social History Narrative  . No narrative on file     Objective: Vital Signs: BP 128/81 (BP Location: Left Arm, Patient Position: Sitting, Cuff Size: Large)   Pulse 75   Resp 13   Ht 5\' 6"  (1.676 m)   Wt 128 lb (58.1 kg)   BMI 20.66 kg/m    Physical Exam  Constitutional: She is oriented to person, place, and time. She appears well-developed and well-nourished.  HENT:  Head: Normocephalic and atraumatic.  Eyes: Conjunctivae and EOM are normal.  Neck: Normal range of motion.  Cardiovascular: Normal rate, regular rhythm, normal heart sounds and intact distal pulses.   Pulmonary/Chest: Effort normal and breath sounds normal.  Abdominal: Soft. Bowel  sounds are normal.  Lymphadenopathy:    She has no cervical adenopathy.  Neurological: She is alert and oriented to person, place, and time.  Skin: Skin is warm and dry. Capillary refill takes less than 2 seconds.  Psychiatric: She has a normal mood and affect. Her behavior is normal.  Nursing note and vitals reviewed.    Musculoskeletal Exam: C-spine and thoracic  lumbar spine good range of motion. She is somewhat limitation of forward flexion of her lumbar spine due to tight hamstrings. 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 Her hip joints knee joints ankles MTPs PIPs with good range of motion with no synovitis.  CDAI Exam: No CDAI exam completed.    Investigation: Findings:  May 2017 TB gold negative, 07/02/2016 CMP normal CBC normal    Imaging: No results found.  Speciality Comments: No specialty comments available.    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 She will continue current treatment for right now.  High risk medication use - Methotrexate 4 per week, Enbrel q week. She's been tolerating the medications very well. I'll check her labs today and then every 3 months to monitor for drug toxicity.  Contracture of elbow joint, right: Unchanged  Renal calcinosis no recent episodes.  Anemia, unspecified type: Resolved panel  Osteopenia, unspecified location : I will schedule her bone density. We will draw her vitamin D levels today as well. Use of calcium and vitamin D and resistive exercises was discussed. Orders: Orders Placed This Encounter  Procedures  . DG DXA FRACTURE ASSESSMENT  . CBC with Differential/Platelet  . COMPLETE METABOLIC PANEL WITH GFR  . CBC with Differential/Platelet  . COMPLETE METABOLIC PANEL WITH GFR  . VITAMIN D 25 Hydroxy (Vit-D Deficiency, Fractures)     Face-to-face time spent with patient was 30 minutes. 50% of time was spent in counseling and coordination of care.  Follow-Up Instructions: Return in about 5 years (around 10/06/2021) for Psoriatic  arthritis.      Bo Merino, MD, Julious Payer

## 2016-10-06 ENCOUNTER — Encounter: Payer: Self-pay | Admitting: Rheumatology

## 2016-10-06 ENCOUNTER — Ambulatory Visit (INDEPENDENT_AMBULATORY_CARE_PROVIDER_SITE_OTHER): Payer: BC Managed Care – PPO | Admitting: Rheumatology

## 2016-10-06 VITALS — BP 128/81 | HR 75 | Resp 13 | Ht 66.0 in | Wt 128.0 lb

## 2016-10-06 DIAGNOSIS — N29 Other disorders of kidney and ureter in diseases classified elsewhere: Secondary | ICD-10-CM

## 2016-10-06 DIAGNOSIS — M8580 Other specified disorders of bone density and structure, unspecified site: Secondary | ICD-10-CM | POA: Diagnosis not present

## 2016-10-06 DIAGNOSIS — Z79899 Other long term (current) drug therapy: Secondary | ICD-10-CM

## 2016-10-06 DIAGNOSIS — D649 Anemia, unspecified: Secondary | ICD-10-CM

## 2016-10-06 DIAGNOSIS — L4050 Arthropathic psoriasis, unspecified: Secondary | ICD-10-CM | POA: Diagnosis not present

## 2016-10-06 DIAGNOSIS — M24521 Contracture, right elbow: Secondary | ICD-10-CM

## 2016-10-06 LAB — COMPLETE METABOLIC PANEL WITH GFR
ALBUMIN: 3.9 g/dL (ref 3.6–5.1)
ALK PHOS: 91 U/L (ref 33–130)
ALT: 14 U/L (ref 6–29)
AST: 21 U/L (ref 10–35)
BILIRUBIN TOTAL: 0.3 mg/dL (ref 0.2–1.2)
BUN: 16 mg/dL (ref 7–25)
CO2: 29 mmol/L (ref 20–31)
CREATININE: 0.86 mg/dL (ref 0.50–1.05)
Calcium: 9.5 mg/dL (ref 8.6–10.4)
Chloride: 103 mmol/L (ref 98–110)
GFR, EST NON AFRICAN AMERICAN: 75 mL/min (ref >=60)
GFR, Est African American: 86 mL/min (ref >=60)
GLUCOSE: 74 mg/dL (ref 65–99)
Potassium: 4.4 mmol/L (ref 3.5–5.3)
SODIUM: 139 mmol/L (ref 135–146)
TOTAL PROTEIN: 7.3 g/dL (ref 6.1–8.1)

## 2016-10-06 LAB — CBC WITH DIFFERENTIAL/PLATELET
BASOS ABS: 0 {cells}/uL (ref 0–200)
BASOS PCT: 0 %
EOS PCT: 2 %
Eosinophils Absolute: 88 cells/uL (ref 15–500)
HCT: 40.3 % (ref 35.0–45.0)
HEMOGLOBIN: 12.7 g/dL (ref 11.7–15.5)
LYMPHS ABS: 1540 {cells}/uL (ref 850–3900)
Lymphocytes Relative: 35 %
MCH: 25.7 pg — AB (ref 27.0–33.0)
MCHC: 31.5 g/dL — ABNORMAL LOW (ref 32.0–36.0)
MCV: 81.6 fL (ref 80.0–100.0)
MPV: 10.5 fL (ref 7.5–12.5)
Monocytes Absolute: 308 cells/uL (ref 200–950)
Monocytes Relative: 7 %
NEUTROS ABS: 2464 {cells}/uL (ref 1500–7800)
Neutrophils Relative %: 56 %
Platelets: 360 10*3/uL (ref 140–400)
RBC: 4.94 MIL/uL (ref 3.80–5.10)
RDW: 16.4 % — ABNORMAL HIGH (ref 11.0–15.0)
WBC: 4.4 10*3/uL (ref 3.8–10.8)

## 2016-10-06 MED ORDER — METHOTREXATE 2.5 MG PO TABS: 10.0000 mg | ORAL_TABLET | ORAL | 0 refills | Status: DC

## 2016-10-06 NOTE — Patient Instructions (Signed)
Standing Labs We placed an order today for your standing lab work.    Please come back and get your standing labs in February and every 3 months  We have open lab Monday through Friday from 8:30-11:30 AM and 1-4 PM at the office of Dr. Dorathy Stallone/Naitik Panwala, PA.   The office is located at [address], [address], [place], [place] [address] No appointment is necessary.   Labs are drawn by [facility].  You may receive a bill from [facility] for your lab work.    

## 2016-10-07 LAB — VITAMIN D 25 HYDROXY (VIT D DEFICIENCY, FRACTURES): Vit D, 25-Hydroxy: 29 ng/mL — ABNORMAL LOW (ref 30–100)

## 2016-10-07 NOTE — Progress Notes (Signed)
CBC normal, CMP normal, vitamin D low at 29. Please call in vitamin D 50,000 units once a week for 3 months. Check vitamin D in 3 months

## 2016-10-08 ENCOUNTER — Telehealth: Payer: Self-pay | Admitting: Radiology

## 2016-10-08 DIAGNOSIS — E559 Vitamin D deficiency, unspecified: Secondary | ICD-10-CM

## 2016-10-08 MED ORDER — VITAMIN D3 1.25 MG (50000 UT) PO CAPS: 50000.0000 [IU] | ORAL_CAPSULE | ORAL | 0 refills | Status: AC

## 2016-10-08 NOTE — Telephone Encounter (Signed)
-----   Message from Bo Merino, MD sent at 10/07/2016  8:39 AM EST ----- CBC normal, CMP normal, vitamin D low at 29. Please call in vitamin D 50,000 units once a week for 3 months. Check vitamin D in 3 months

## 2016-10-08 NOTE — Telephone Encounter (Signed)
I have called patient to advise. Left message for her to call me back, put in order for Vit D meds and labs

## 2016-10-09 ENCOUNTER — Telehealth: Payer: Self-pay | Admitting: Rheumatology

## 2016-10-09 NOTE — Telephone Encounter (Signed)
Patient states she is returning Amy's call from yesterday.

## 2016-10-09 NOTE — Telephone Encounter (Signed)
Called patient advised.

## 2016-10-09 NOTE — Telephone Encounter (Signed)
Thank you I have called patient

## 2016-10-15 ENCOUNTER — Other Ambulatory Visit: Payer: Self-pay | Admitting: Obstetrics and Gynecology

## 2016-10-15 ENCOUNTER — Other Ambulatory Visit (HOSPITAL_COMMUNITY)
Admission: RE | Admit: 2016-10-15 | Discharge: 2016-10-15 | Disposition: A | Discharge status: Home / Self Care | Payer: BC Managed Care – PPO | Source: Ambulatory Visit | Attending: Obstetrics and Gynecology | Admitting: Obstetrics and Gynecology

## 2016-10-15 DIAGNOSIS — Z01411 Encounter for gynecological examination (general) (routine) with abnormal findings: Secondary | ICD-10-CM | POA: Diagnosis present

## 2016-10-15 DIAGNOSIS — Z1151 Encounter for screening for human papillomavirus (HPV): Secondary | ICD-10-CM | POA: Insufficient documentation

## 2016-10-20 LAB — CYTOLOGY - PAP: HPV (WINDOPATH): NOT DETECTED

## 2016-10-21 ENCOUNTER — Other Ambulatory Visit: Payer: Self-pay | Admitting: Rheumatology

## 2016-10-21 DIAGNOSIS — Z79899 Other long term (current) drug therapy: Secondary | ICD-10-CM

## 2016-10-21 NOTE — Telephone Encounter (Signed)
10/06/16 last visit labs WNL  Next visit 03/04/17 Ok to refill per Dr Estanislado Pandy

## 2016-12-16 ENCOUNTER — Other Ambulatory Visit: Payer: Self-pay | Admitting: Rheumatology

## 2016-12-18 NOTE — Telephone Encounter (Signed)
Last visit and labs 10/06/16 03/04/17 next visit TB gold negative 04/16/16 Ok to refill per Dr Estanislado Pandy

## 2016-12-23 ENCOUNTER — Other Ambulatory Visit: Payer: Self-pay | Admitting: *Deleted

## 2016-12-23 MED ORDER — FOLGARD OS 500-1.1 MG PO TABS: 2.0000 | ORAL_TABLET | Freq: Every day | ORAL | 1 refills | Status: DC

## 2016-12-23 NOTE — Telephone Encounter (Signed)
Refill request received via fax for [facility].  Last Visit: 10/06/16 Next Visit: 03/04/17 Labs: 10/06/16 Low Vit D  Okay to refill [place]?

## 2016-12-23 NOTE — Telephone Encounter (Signed)
ok 

## 2017-01-05 ENCOUNTER — Other Ambulatory Visit: Payer: Self-pay | Admitting: Rheumatology

## 2017-01-05 LAB — COMPLETE METABOLIC PANEL WITH GFR
ALT: 12 U/L (ref 6–29)
AST: 18 U/L (ref 10–35)
Albumin: 3.8 g/dL (ref 3.6–5.1)
Alkaline Phosphatase: 92 U/L (ref 33–130)
BILIRUBIN TOTAL: 0.5 mg/dL (ref 0.2–1.2)
BUN: 15 mg/dL (ref 7–25)
CO2: 34 mmol/L — AB (ref 20–31)
Calcium: 9.7 mg/dL (ref 8.6–10.4)
Chloride: 102 mmol/L (ref 98–110)
Creat: 0.91 mg/dL (ref 0.50–1.05)
GFR, EST AFRICAN AMERICAN: 80 mL/min (ref >=60)
GFR, EST NON AFRICAN AMERICAN: 69 mL/min (ref >=60)
GLUCOSE: 83 mg/dL (ref 65–99)
Potassium: 4.1 mmol/L (ref 3.5–5.3)
SODIUM: 140 mmol/L (ref 135–146)
TOTAL PROTEIN: 7.3 g/dL (ref 6.1–8.1)

## 2017-01-05 LAB — CBC WITH DIFFERENTIAL/PLATELET
BASOS ABS: 43 {cells}/uL (ref 0–200)
BASOS PCT: 1 %
EOS ABS: 172 {cells}/uL (ref 15–500)
Eosinophils Relative: 4 %
HEMATOCRIT: 38.2 % (ref 35.0–45.0)
HEMOGLOBIN: 11.8 g/dL (ref 11.7–15.5)
LYMPHS ABS: 1548 {cells}/uL (ref 850–3900)
Lymphocytes Relative: 36 %
MCH: 25.2 pg — AB (ref 27.0–33.0)
MCHC: 30.9 g/dL — ABNORMAL LOW (ref 32.0–36.0)
MCV: 81.4 fL (ref 80.0–100.0)
MONO ABS: 301 {cells}/uL (ref 200–950)
MONOS PCT: 7 %
MPV: 10.4 fL (ref 7.5–12.5)
NEUTROS ABS: 2236 {cells}/uL (ref 1500–7800)
Neutrophils Relative %: 52 %
PLATELETS: 326 10*3/uL (ref 140–400)
RBC: 4.69 MIL/uL (ref 3.80–5.10)
RDW: 15.5 % — ABNORMAL HIGH (ref 11.0–15.0)
WBC: 4.3 10*3/uL (ref 3.8–10.8)

## 2017-01-27 ENCOUNTER — Other Ambulatory Visit: Payer: Self-pay | Admitting: Rheumatology

## 2017-01-27 DIAGNOSIS — Z79899 Other long term (current) drug therapy: Secondary | ICD-10-CM

## 2017-01-27 NOTE — Telephone Encounter (Signed)
Last Visit: 10/06/16 Next Visit: 03/04/17 Labs: 01/05/17 WNL  Okay to refill MTX?

## 2017-01-27 NOTE — Telephone Encounter (Signed)
ok 

## 2017-02-24 NOTE — Progress Notes (Deleted)
   Office Visit Note  Patient: Taylor Kirby             Date of Birth: 1956/09/17           MRN: [id number]             PCP: Vidal Schwalbe, MD Referring: Harlan Stains, MD Visit Date: 03/04/2017 Occupation: @GUAROCC @    Subjective:  No chief complaint on file.   History of Present Illness: Taylor Kirby is a 61 y.o. female ***   Activities of Daily Living:  Patient reports morning stiffness for *** {minute/hour:19697}.   Patient {ACTIONS;DENIES/REPORTS:21021675::"Denies"} nocturnal pain.  Difficulty dressing/grooming: {ACTIONS;DENIES/REPORTS:21021675::"Denies"} Difficulty climbing stairs: {ACTIONS;DENIES/REPORTS:21021675::"Denies"} Difficulty getting out of chair: {ACTIONS;DENIES/REPORTS:21021675::"Denies"} Difficulty using hands for taps, buttons, cutlery, and/or writing: {ACTIONS;DENIES/REPORTS:21021675::"Denies"}   No Rheumatology ROS completed.   PMFS History:  Patient Active Problem List   Diagnosis Date Noted  . 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 Anemia 10/05/2016  . Osteopenia 10/05/2016  . 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 High risk medication use 10/03/2016  . Psoriatic arthritis ([place]) 10/25/2015    Past Medical History:  Diagnosis Date  . Allergy   . Anemia   . Heart murmur   . Rheumatoid arthritis ([place])     Family History  Problem Relation Age of Onset  . Hyperlipidemia Mother   . Hypertension Mother   . Stroke Mother   . Hyperlipidemia Father   . Cancer Father   . Hypertension Sister   . Heart disease Maternal Grandmother   . Cancer Maternal Grandfather   . Hypertension Paternal Grandmother    Past Surgical History:  Procedure Laterality Date  . TUBAL LIGATION     Social History   Social History Narrative  . No narrative on file     Objective: Vital Signs: There were no vitals taken for this visit.   Physical Exam   Musculoskeletal Exam: ***  CDAI Exam: No CDAI exam completed.    Investigation: No additional  findings.   Imaging: No results found.  Speciality Comments: No specialty comments available.    Procedures:  No procedures performed Allergies: Penicillins and Sulfa antibiotics   Assessment / Plan:     Visit Diagnoses: Psoriatic arthritis ([place])  Osteopenia of multiple sites  High risk medication use    She also has history of anemia and history of renal calculi.   No orders of the defined types were placed in this encounter.  No orders of the defined types were placed in this encounter.   Face-to-face time spent with patient was *** minutes. 50% of time was spent in counseling and coordination of care.  Follow-Up Instructions: No Follow-up on file.   Grabiela Wohlford, RT  Note - This record has been created using [employer].  Chart creation errors have been sought, but may not always  have been located. Such creation errors do not reflect on  the standard of medical care.

## 2017-03-04 ENCOUNTER — Ambulatory Visit: Payer: BC Managed Care – PPO | Admitting: Rheumatology

## 2017-03-10 DIAGNOSIS — Z862 Personal history of diseases of the blood and blood-forming organs and certain disorders involving the immune mechanism: Secondary | ICD-10-CM | POA: Insufficient documentation

## 2017-03-10 NOTE — Progress Notes (Signed)
Office Visit Note  Patient: Taylor Kirby             Date of Birth: [birth year]           MRN: [id number]             PCP: Vidal Schwalbe, MD Referring: Harlan Stains, MD Visit Date: 03/24/2017 Occupation: @GUAROCC @    Subjective:  Pain right foot   History of Present Illness: Taylor Kirby is a 61 y.o. female with history of psoriatic arthritis. She states about a month ago she had an episode with pain and discomfort in her right first MTP joint. She states the symptoms lasted for about 3-4 days. She noted redness and swelling in that joint. She does not describe the pain to be severe. None of the other joints are painful.   Activities of Daily Living:  Patient reports morning stiffness for 0 minute.   Patient Denies nocturnal pain.  Difficulty dressing/grooming: Denies Difficulty climbing stairs: Denies Difficulty getting out of chair: Denies Difficulty using hands for taps, buttons, cutlery, and/or writing: Denies   Review of Systems  Constitutional: Positive for fatigue. Negative for night sweats, weight gain, weight loss and weakness.  HENT: Negative for mouth sores, trouble swallowing, trouble swallowing, mouth dryness and nose dryness.   Eyes: Negative for pain, redness, visual disturbance and dryness.  Respiratory: Negative for cough, shortness of breath and difficulty breathing.   Cardiovascular: Negative for chest pain, palpitations, hypertension, irregular heartbeat and swelling in legs/feet.  Gastrointestinal: Negative for blood in stool, constipation and diarrhea.  Endocrine: Negative for increased urination.  Genitourinary: Negative for vaginal dryness.  Musculoskeletal: Positive for arthralgias and joint pain. Negative for joint swelling, myalgias, muscle weakness, morning stiffness, muscle tenderness and myalgias.  Skin: Negative for color change, rash, hair loss, skin tightness, ulcers and sensitivity to sunlight.  Allergic/Immunologic: Negative for  susceptible to infections.  Neurological: Negative for dizziness, memory loss and night sweats.  Hematological: Negative for swollen glands.  Psychiatric/Behavioral: Negative for depressed mood and sleep disturbance. The patient is nervous/anxious.     PMFS History:  Patient Active Problem List   Diagnosis Date Noted  . History of anemia 03/10/2017  . 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 Anemia 10/05/2016  . Osteopenia 10/05/2016  . 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 High risk medication use 10/03/2016  . Psoriatic arthritis ([place]) 10/25/2015    Past Medical History:  Diagnosis Date  . Allergy   . Anemia   . Heart murmur   . Rheumatoid arthritis ([place])     Family History  Problem Relation Age of Onset  . Hyperlipidemia Mother   . Hypertension Mother   . Stroke Mother   . Hyperlipidemia Father   . Cancer Father   . Hypertension Sister   . Heart disease Maternal Grandmother   . Cancer Maternal Grandfather   . Hypertension Paternal Grandmother    Past Surgical History:  Procedure Laterality Date  . TUBAL LIGATION     Social History   Social History Narrative  . No narrative on file     Objective: Vital Signs: BP 120/70   Pulse 78 Comment: irregular  Resp 14   Ht 5' 4"  (1.626 m)   Wt 125 lb (56.7 kg)   BMI 21.46 kg/m    Physical Exam  Constitutional: She is oriented to person, place, and time. She appears well-developed and well-nourished.  HENT:  Head: Normocephalic and atraumatic.  Eyes: Conjunctivae and EOM are normal.  Neck: Normal range of motion.  Cardiovascular: Normal rate, regular rhythm, normal heart sounds and intact distal pulses.   Pulmonary/Chest: Effort normal and breath sounds normal.  Abdominal: Soft. Bowel sounds are normal.  Lymphadenopathy:    She has no cervical adenopathy.  Neurological: She is alert and oriented to person, place, and time.  Skin: Skin is warm and dry. Capillary refill takes less than 2 seconds.  Psychiatric:  She has a normal mood and affect. Her behavior is normal.  Nursing note and vitals reviewed.    Musculoskeletal Exam: C-spine and thoracic lumbar spine good range of motion. Shoulder joints are good range of motion she has right elbow joint contracture which is unchanged without any synovitis. 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 Hip joints knee joints ankle joints are good range of motion. MTPs were also good range of motion with no synovitis.  CDAI Exam: CDAI Homunculus Exam:   Joint Counts:  CDAI Tender Joint count: 0 CDAI Swollen Joint count: 0  Global Assessments:  Patient Global Assessment: 2 Provider Global Assessment: 2  CDAI Calculated Score: 4     Investigation: No additional findings. Orders Only on 01/05/2017  Component Date Value Ref Range Status  . Sodium 01/05/2017 140  135 - 146 mmol/L Final  . Potassium 01/05/2017 4.1  3.5 - 5.3 mmol/L Final  . Chloride 01/05/2017 102  98 - 110 mmol/L Final  . CO2 01/05/2017 34* 20 - 31 mmol/L Final  . Glucose, Bld 01/05/2017 83  65 - 99 mg/dL Final  . BUN 01/05/2017 15  7 - 25 mg/dL Final  . Creat 01/05/2017 0.91  0.50 - 1.05 mg/dL Final   Comment:   For patients > or = 59 years of age: The upper reference limit for Creatinine is approximately 13% higher for people identified as African-American.     . Total Bilirubin 01/05/2017 0.5  0.2 - 1.2 mg/dL Final  . Alkaline Phosphatase 01/05/2017 92  33 - 130 U/L Final  . AST 01/05/2017 18  10 - 35 U/L Final  . ALT 01/05/2017 12  6 - 29 U/L Final  . Total Protein 01/05/2017 7.3  6.1 - 8.1 g/dL Final  . Albumin 01/05/2017 3.8  3.6 - 5.1 g/dL Final  . Calcium 01/05/2017 9.7  8.6 - 10.4 mg/dL Final  . GFR, Est African American 01/05/2017 80  >=60 mL/min Final  . GFR, Est Non African American 01/05/2017 69   >=60 mL/min Final  . WBC 01/05/2017 4.3  3.8 - 10.8 K/uL Final  . RBC 01/05/2017 4.69  3.80 - 5.10 MIL/uL Final  . Hemoglobin 01/05/2017 11.8  11.7 - 15.5 g/dL Final  . HCT 01/05/2017 38.2  35.0 - 45.0 % Final  . MCV 01/05/2017 81.4  80.0 - 100.0 fL Final  . MCH 01/05/2017 25.2* 27.0 - 33.0 pg Final  . MCHC 01/05/2017 30.9* 32.0 - 36.0 g/dL Final  . RDW 01/05/2017 15.5* 11.0 - 15.0 % Final  . Platelets 01/05/2017 326  140 - 400 K/uL Final  . MPV 01/05/2017 10.4  7.5 - 12.5 fL Final  . Neutro Abs 01/05/2017 2236  1,500 - 7,800 cells/uL Final  . Lymphs Abs 01/05/2017 1548  850 - 3,900 cells/uL Final  . Monocytes Absolute 01/05/2017 301  200 - 950 cells/uL Final  . Eosinophils Absolute 01/05/2017 172  15 - 500 cells/uL Final  . Basophils Absolute 01/05/2017 43  0 - 200 cells/uL Final  . Neutrophils Relative % 01/05/2017 52  % Final  . Lymphocytes Relative 01/05/2017 36  % Final  . Monocytes Relative 01/05/2017 7  % Final  . Eosinophils Relative 01/05/2017 4  % Final  . Basophils Relative 01/05/2017 1  % Final  . Smear Review 01/05/2017 Criteria for review not met   Final     Imaging: No results found.  Speciality Comments: No specialty comments available.    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 She has some contractures and deformities in her joints but no active synovitis on examination today she is doing fairly well on Enbrel.  High risk medication use - Enbrel 50 mg subcutaneous every week, methotrexate, folic acid - Plan: Quantiferon tb gold assay (blood). Her labs are stable we will check her labs every 3 months to monitor for drug toxicity.  Contracture of elbow joint, right: Unchanged without any synovitis  Pain in right foot - she had an episode of right first MTP pain and swelling about a month ago which resolved after 3-4 days later.  I doubt that it was gout. But we will check uric acid level today. Plan: Uric acid  Osteopenia of multiple sites: She is on supplements. Her DEXA scan is due. We'll advise her to get repeat bone density.  History of anemia: It's resolved  Renal calcinosis    Orders: Orders Placed This Encounter  Procedures  . Uric acid  . Quantiferon tb gold assay (blood)   No orders of the defined types were placed in this encounter.   Face-to-face time spent with patient was 30 minutes. 50% of time was spent in counseling and coordination of care.  Follow-Up Instructions: Return in about 5 months (around 08/24/2017) for Psoriatic arthritis.   Bo Merino, MD  Note - This record has been created using Editor, commissioning.  Chart creation errors have been sought, but may not always  have been located. Such creation errors do not reflect on  the standard of medical care.

## 2017-03-19 ENCOUNTER — Other Ambulatory Visit: Payer: Self-pay | Admitting: Rheumatology

## 2017-03-19 NOTE — Telephone Encounter (Signed)
Last visit 10/06/17 Next visit 03/24/17 Labs WNL 12/2016  TB gold neg in May  2017 Ok to refill per Dr Estanislado Pandy

## 2017-03-24 ENCOUNTER — Telehealth: Payer: Self-pay | Admitting: Radiology

## 2017-03-24 ENCOUNTER — Ambulatory Visit (INDEPENDENT_AMBULATORY_CARE_PROVIDER_SITE_OTHER): Payer: BC Managed Care – PPO | Admitting: Rheumatology

## 2017-03-24 ENCOUNTER — Encounter: Payer: Self-pay | Admitting: Rheumatology

## 2017-03-24 DIAGNOSIS — M24521 Contracture, right elbow: Secondary | ICD-10-CM | POA: Diagnosis not present

## 2017-03-24 DIAGNOSIS — Z862 Personal history of diseases of the blood and blood-forming organs and certain disorders involving the immune mechanism: Secondary | ICD-10-CM

## 2017-03-24 DIAGNOSIS — M8589 Other specified disorders of bone density and structure, multiple sites: Secondary | ICD-10-CM | POA: Diagnosis not present

## 2017-03-24 DIAGNOSIS — Z79899 Other long term (current) drug therapy: Secondary | ICD-10-CM | POA: Diagnosis not present

## 2017-03-24 DIAGNOSIS — L4050 Arthropathic psoriasis, unspecified: Secondary | ICD-10-CM

## 2017-03-24 DIAGNOSIS — M79671 Pain in right foot: Secondary | ICD-10-CM

## 2017-03-24 DIAGNOSIS — N29 Other disorders of kidney and ureter in diseases classified elsewhere: Secondary | ICD-10-CM

## 2017-03-24 NOTE — Telephone Encounter (Signed)
I called pt about DXA study left mssg for her to call me back

## 2017-03-24 NOTE — Patient Instructions (Addendum)
Standing Labs We placed an order today for your standing lab work.    Please come back and get your standing labs in May and every 3 months  Uric acid and TB gold level with the next lab.  We have open lab Monday through Friday from 8:30-11:30 AM and 1:30-4 PM at the office of Dr. Tresa Moore, PA.   The office is located at [address], [place], [place], [place] [address] No appointment is necessary.   Labs are drawn by [facility].  You may receive a bill from [facility] for your lab work.

## 2017-03-30 NOTE — Telephone Encounter (Signed)
Patient has not returned my calls regarding the DEXA study. To you FYI

## 2017-04-20 ENCOUNTER — Other Ambulatory Visit: Payer: Self-pay | Admitting: Rheumatology

## 2017-04-20 DIAGNOSIS — Z79899 Other long term (current) drug therapy: Secondary | ICD-10-CM

## 2017-04-20 NOTE — Telephone Encounter (Signed)
ok 

## 2017-04-20 NOTE — Telephone Encounter (Signed)
Last Visit: 03/24/17 Next Visit: 08/24/17 Labs: 01/05/17 WNL  Okay to refill MTX?

## 2017-06-24 ENCOUNTER — Other Ambulatory Visit: Payer: Self-pay | Admitting: Rheumatology

## 2017-06-24 DIAGNOSIS — Z9225 Personal history of immunosupression therapy: Secondary | ICD-10-CM

## 2017-06-24 DIAGNOSIS — Z79899 Other long term (current) drug therapy: Secondary | ICD-10-CM

## 2017-06-24 NOTE — Telephone Encounter (Signed)
Refill after lab draw

## 2017-06-24 NOTE — Telephone Encounter (Signed)
Last Visit: 03/24/17 Next Visit: 08/24/17 Labs: 01/05/17 WNL TB gold neg in May  2017  Patient coming to update labs tomorrow  Okay to refill Enbrel ?

## 2017-06-25 ENCOUNTER — Other Ambulatory Visit: Payer: Self-pay

## 2017-06-25 DIAGNOSIS — Z9225 Personal history of immunosupression therapy: Secondary | ICD-10-CM

## 2017-06-25 DIAGNOSIS — Z79899 Other long term (current) drug therapy: Secondary | ICD-10-CM

## 2017-06-25 LAB — CBC WITH DIFFERENTIAL/PLATELET
BASOS ABS: 42 {cells}/uL (ref 0–200)
BASOS PCT: 1 %
EOS ABS: 42 {cells}/uL (ref 15–500)
Eosinophils Relative: 1 %
HEMATOCRIT: 41.2 % (ref 35.0–45.0)
HEMOGLOBIN: 12.9 g/dL (ref 11.7–15.5)
Lymphocytes Relative: 30 %
Lymphs Abs: 1260 cells/uL (ref 850–3900)
MCH: 25.6 pg — ABNORMAL LOW (ref 27.0–33.0)
MCHC: 31.3 g/dL — ABNORMAL LOW (ref 32.0–36.0)
MCV: 81.9 fL (ref 80.0–100.0)
MONO ABS: 252 {cells}/uL (ref 200–950)
MPV: 10.5 fL (ref 7.5–12.5)
Monocytes Relative: 6 %
NEUTROS PCT: 62 %
Neutro Abs: 2604 cells/uL (ref 1500–7800)
Platelets: 388 10*3/uL (ref 140–400)
RBC: 5.03 MIL/uL (ref 3.80–5.10)
RDW: 16.8 % — ABNORMAL HIGH (ref 11.0–15.0)
WBC: 4.2 10*3/uL (ref 3.8–10.8)

## 2017-06-26 LAB — COMPLETE METABOLIC PANEL WITH GFR
ALT: 20 U/L (ref 6–29)
AST: 28 U/L (ref 10–35)
Albumin: 4.2 g/dL (ref 3.6–5.1)
Alkaline Phosphatase: 110 U/L (ref 33–130)
BILIRUBIN TOTAL: 0.5 mg/dL (ref 0.2–1.2)
BUN: 14 mg/dL (ref 7–25)
CALCIUM: 9.8 mg/dL (ref 8.6–10.4)
CO2: 24 mmol/L (ref 20–31)
CREATININE: 0.98 mg/dL (ref 0.50–1.05)
Chloride: 100 mmol/L (ref 98–110)
GFR, EST AFRICAN AMERICAN: 73 mL/min (ref >=60)
GFR, Est Non African American: 63 mL/min (ref >=60)
Glucose, Bld: 80 mg/dL (ref 65–99)
Potassium: 4.4 mmol/L (ref 3.5–5.3)
Sodium: 138 mmol/L (ref 135–146)
TOTAL PROTEIN: 7.9 g/dL (ref 6.1–8.1)

## 2017-06-26 LAB — QUANTIFERON TB GOLD ASSAY (BLOOD)
INTERFERON GAMMA RELEASE ASSAY: NEGATIVE
Mitogen-Nil: 6.66 IU/mL
QUANTIFERON NIL VALUE: 0.08 [IU]/mL
Quantiferon Tb Ag Minus Nil Value: 0 IU/mL

## 2017-06-26 LAB — URIC ACID: Uric Acid, Serum: 4.9 mg/dL (ref 2.5–7.0)

## 2017-06-27 NOTE — Progress Notes (Signed)
WNL

## 2017-07-18 ENCOUNTER — Other Ambulatory Visit: Payer: Self-pay | Admitting: Rheumatology

## 2017-07-18 DIAGNOSIS — Z79899 Other long term (current) drug therapy: Secondary | ICD-10-CM

## 2017-07-19 NOTE — Telephone Encounter (Signed)
03/24/17 last visit  08/24/17 next visit    CBC Latest Ref Rng & Units 06/25/2017 01/05/2017 10/06/2016  WBC 3.8 - 10.8 K/uL 4.2 4.3 4.4  Hemoglobin 11.7 - 15.5 g/dL 12.9 11.8 12.7  Hematocrit 35.0 - 45.0 % 41.2 38.2 40.3  Platelets 140 - 400 K/uL 388 326 360   CMP Latest Ref Rng & Units 06/25/2017 01/05/2017 10/06/2016  Glucose 65 - 99 mg/dL 80 83 74  BUN 7 - 25 mg/dL 14 15 16   Creatinine 0.50 - 1.05 mg/dL 0.98 0.91 0.86  Sodium 135 - 146 mmol/L 138 140 139  Potassium 3.5 - 5.3 mmol/L 4.4 4.1 4.4  Chloride 98 - 110 mmol/L 100 102 103  CO2 20 - 31 mmol/L 24 34(H) 29  Calcium 8.6 - 10.4 mg/dL 9.8 9.7 9.5  Total Protein 6.1 - 8.1 g/dL 7.9 7.3 7.3  Total Bilirubin 0.2 - 1.2 mg/dL 0.5 0.5 0.3  Alkaline Phos 33 - 130 U/L 110 92 91  AST 10 - 35 U/L 28 18 21   ALT 6 - 29 U/L 20 12 14    Ok to refill per Dr Estanislado Pandy

## 2017-07-30 ENCOUNTER — Telehealth: Payer: Self-pay | Admitting: *Deleted

## 2017-07-30 MED ORDER — FOLIC ACID 1 MG PO TABS: 1.0000 mg | ORAL_TABLET | Freq: Every day | ORAL | 3 refills | Status: DC

## 2017-07-30 NOTE — Telephone Encounter (Signed)
Folic acid 1 mg by mouth daily 90 day supply with 3 refills

## 2017-07-30 NOTE — Telephone Encounter (Signed)
Received a call from Amber at [facility] stating that [facility] is not available and requesting for [place] to switch the medication. Can we send something different to the pharmacy for her>?

## 2017-07-30 NOTE — Telephone Encounter (Signed)
Prescription sent to the pharmacy.

## 2017-08-13 NOTE — Progress Notes (Deleted)
Office Visit Note  Patient: Taylor Kirby             Date of Birth: March 05, 1956           MRN: [id number]             PCP: Harlan Stains, MD Referring: Harlan Stains, MD Visit Date: 08/24/2017 Occupation: @GUAROCC @    Subjective:  No chief complaint on file.   History of Present Illness: Taylor Kirby is a 61 y.o. female ***   Activities of Daily Living:  Patient reports morning stiffness for *** {minute/hour:19697}.   Patient {ACTIONS;DENIES/REPORTS:21021675::"Denies"} nocturnal pain.  Difficulty dressing/grooming: {ACTIONS;DENIES/REPORTS:21021675::"Denies"} Difficulty climbing stairs: {ACTIONS;DENIES/REPORTS:21021675::"Denies"} Difficulty getting out of chair: {ACTIONS;DENIES/REPORTS:21021675::"Denies"} Difficulty using hands for taps, buttons, cutlery, and/or writing: {ACTIONS;DENIES/REPORTS:21021675::"Denies"}   No Rheumatology ROS completed.   PMFS History:  Patient Active Problem List   Diagnosis Date Noted  . History of anemia 03/10/2017  . 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 Anemia 10/05/2016  . Osteopenia 10/05/2016  . 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 High risk medication use 10/03/2016  . Psoriatic arthritis ([place]) 10/25/2015    Past Medical History:  Diagnosis Date  . Allergy   . Anemia   . Heart murmur   . Rheumatoid arthritis ([place])     Family History  Problem Relation Age of Onset  . Hyperlipidemia Mother   . Hypertension Mother   . Stroke Mother   . Hyperlipidemia Father   . Cancer Father   . Hypertension Sister   . Heart disease Maternal Grandmother   . Cancer Maternal Grandfather   . Hypertension Paternal Grandmother    Past Surgical History:  Procedure Laterality Date  . TUBAL LIGATION     Social History   Social History Narrative  . No narrative on file     Objective: Vital Signs: There were no vitals taken for this visit.   Physical Exam   Musculoskeletal Exam: ***  CDAI Exam: No CDAI exam completed.     Investigation: No additional findings.TB Gold: Negative in 05/2017 CBC Latest Ref Rng & Units 06/25/2017 01/05/2017 10/06/2016  WBC 3.8 - 10.8 K/uL 4.2 4.3 4.4  Hemoglobin 11.7 - 15.5 g/dL 12.9 11.8 12.7  Hematocrit 35.0 - 45.0 % 41.2 38.2 40.3  Platelets 140 - 400 K/uL 388 326 360   CMP Latest Ref Rng & Units 06/25/2017 01/05/2017 10/06/2016  Glucose 65 - 99 mg/dL 80 83 74  BUN 7 - 25 mg/dL 14 15 16   Creatinine 0.50 - 1.05 mg/dL 0.98 0.91 0.86  Sodium 135 - 146 mmol/L 138 140 139  Potassium 3.5 - 5.3 mmol/L 4.4 4.1 4.4  Chloride 98 - 110 mmol/L 100 102 103  CO2 20 - 31 mmol/L 24 34(H) 29  Calcium 8.6 - 10.4 mg/dL 9.8 9.7 9.5  Total Protein 6.1 - 8.1 g/dL 7.9 7.3 7.3  Total Bilirubin 0.2 - 1.2 mg/dL 0.5 0.5 0.3  Alkaline Phos 33 - 130 U/L 110 92 91  AST 10 - 35 U/L 28 18 21   ALT 6 - 29 U/L 20 12 14     Imaging: No results found.  Speciality Comments: No specialty comments available.    Procedures:  No procedures performed Allergies: Penicillins and Sulfa antibiotics   Assessment / Plan:     Visit Diagnoses: No diagnosis found.    Orders: No orders of the defined types were placed in this encounter.  No orders of the defined types were placed in this encounter.   Face-to-face time  spent with patient was *** minutes. 50% of time was spent in counseling and coordination of care.  Follow-Up Instructions: No Follow-up on file.   Earnestine Mealing, NT  Note - This record has been created using Editor, commissioning.  Chart creation errors have been sought, but may not always  have been located. Such creation errors do not reflect on  the standard of medical care.

## 2017-08-24 ENCOUNTER — Ambulatory Visit: Payer: BC Managed Care – PPO | Admitting: Rheumatology

## 2017-08-30 ENCOUNTER — Other Ambulatory Visit: Payer: Self-pay | Admitting: *Deleted

## 2017-08-30 DIAGNOSIS — Z79899 Other long term (current) drug therapy: Secondary | ICD-10-CM

## 2017-08-30 DIAGNOSIS — E559 Vitamin D deficiency, unspecified: Secondary | ICD-10-CM

## 2017-08-31 ENCOUNTER — Ambulatory Visit (INDEPENDENT_AMBULATORY_CARE_PROVIDER_SITE_OTHER): Payer: BC Managed Care – PPO | Admitting: Rheumatology

## 2017-08-31 ENCOUNTER — Encounter: Payer: Self-pay | Admitting: Rheumatology

## 2017-08-31 VITALS — BP 124/74 | HR 78 | Resp 16 | Ht 64.0 in | Wt 122.0 lb

## 2017-08-31 DIAGNOSIS — Z79899 Other long term (current) drug therapy: Secondary | ICD-10-CM | POA: Diagnosis not present

## 2017-08-31 DIAGNOSIS — L4050 Arthropathic psoriasis, unspecified: Secondary | ICD-10-CM

## 2017-08-31 DIAGNOSIS — M62838 Other muscle spasm: Secondary | ICD-10-CM

## 2017-08-31 DIAGNOSIS — L408 Other psoriasis: Secondary | ICD-10-CM | POA: Diagnosis not present

## 2017-08-31 LAB — VITAMIN D 25 HYDROXY (VIT D DEFICIENCY, FRACTURES): VIT D 25 HYDROXY: 39 ng/mL (ref 30–100)

## 2017-08-31 NOTE — Progress Notes (Signed)
Office Visit Note  Patient: Taylor Kirby             Date of Birth: 12/04/1955           MRN: [id number]             PCP: Harlan Stains, MD Referring: Harlan Stains, MD Visit Date: 08/31/2017 Occupation: _0 @    Subjective:  Medication Management   History of Present Illness: Taylor Kirby is a 61 y.o. 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  Today, patient states that she is doing well. No joint pain swelling or stiffness. She takes her medications as prescribed.  Bilateral trapezius muscle spasms for the last 2 weeks. Tolerable. Patient declines injection with trigger point injection..  Activities of Daily Living:  Patient reports morning stiffness for 15 minutes.   Patient Denies nocturnal pain.  Difficulty dressing/grooming: Denies Difficulty climbing stairs: Denies Difficulty getting out of chair: Denies Difficulty using hands for taps, buttons, cutlery, and/or writing: Denies   Review of Systems  Constitutional: Negative for fatigue.  HENT: Negative for mouth sores and mouth dryness.   Eyes: Negative for dryness.  Respiratory: Negative for shortness of breath.   Gastrointestinal: Negative for constipation and diarrhea.  Musculoskeletal: Negative for myalgias and myalgias.  Skin: Negative for sensitivity to sunlight.  Psychiatric/Behavioral: Negative for decreased concentration and sleep disturbance.    PMFS History:  Patient Active Problem List   Diagnosis Date Noted  . History of anemia 03/10/2017  . 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 Anemia 10/05/2016  . Osteopenia 10/05/2016  . 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 High risk medication use 10/03/2016  . Psoriatic arthritis ([place]) 10/25/2015    Past Medical History:  Diagnosis Date  . Allergy   .  Anemia   . Heart murmur   . Rheumatoid arthritis ([place])     Family History  Problem Relation Age of Onset  . Hyperlipidemia Mother   . Hypertension Mother   . Stroke Mother   . Hyperlipidemia Father   . Cancer Father   . Hypertension Sister   . Heart disease Maternal Grandmother   . Cancer Maternal Grandfather   . Hypertension Paternal Grandmother    Past Surgical History:  Procedure Laterality Date  . TUBAL LIGATION     Social History   Social History Narrative  . No narrative on file     Objective: Vital Signs: BP 124/74   Pulse 78   Resp 16   Ht _1  (1.626 m)   Wt 122 lb (55.3 kg)   BMI 20.94 kg/m    Physical Exam  Constitutional: She is oriented to person, place, and time. She appears well-developed and well-nourished.  HENT:  Head: Normocephalic and atraumatic.  Eyes: Pupils are equal, round, and reactive to light. EOM are normal.  Cardiovascular: Normal rate, regular rhythm and normal heart sounds.  Exam reveals no gallop and no friction rub.   No murmur heard. Pulmonary/Chest: Effort normal and breath sounds normal. She has no wheezes. She has no rales.  Abdominal: Soft. Bowel sounds are normal. She exhibits no distension. There is no tenderness. There is no guarding. No hernia.  Musculoskeletal: Normal range of motion. She exhibits no edema, tenderness or deformity.  Lymphadenopathy:    She has no cervical adenopathy.  Neurological: She is  alert and oriented to person, place, and time. Coordination normal.  Skin: Skin is warm and dry. Capillary refill takes less than 2 seconds. No rash noted.  Psychiatric: She has a normal mood and affect. Her behavior is normal.  Nursing note and vitals reviewed.    Right elbow with contracture at 50degrees Right second finger with angulation at the DIP joint  Musculoskeletal Exam:  Full range of motion of all joints Grip strength is equal and strong bilaterally Fibromyalgia tender points are absent  CDAI  Exam: No CDAI exam completed.  No synovitis on examination  Investigation: Patient came yesterday to our office and had labs drawn. Results are pending. Patient's TB gold is negative July 2018.  No additional findings. Orders Only on 08/30/2017  Component Date Value Ref Range Status  . Vit D, 25-Hydroxy 08/30/2017 39  30 - 100 ng/mL Final   Comment: Vitamin D Status         25-OH Vitamin D: . Deficiency:                    <20 ng/mL Insufficiency:             20 - 29 ng/mL Optimal:                 > or = 30 ng/mL . For 25-OH Vitamin D testing on patients on  D2-supplementation and patients for whom quantitation  of D2 and D3 fractions is required, the QuestAssureD(TM) 25-OH VIT D, (D2,D3), LC/MS/MS is recommended: order  code [phone] (patients >32yr). . For more information on this test, go to: http://education.questdiagnostics.com/faq/FAQ163 (This link is being provided for  informational/educational purposes only.)   Orders Only on 06/25/2017  Component Date Value Ref Range Status  . WBC 06/25/2017 4.2  3.8 - 10.8 K/uL Final  . RBC 06/25/2017 5.03  3.80 - 5.10 MIL/uL Final  . Hemoglobin 06/25/2017 12.9  11.7 - 15.5 g/dL Final  . HCT 06/25/2017 41.2  35.0 - 45.0 % Final  . MCV 06/25/2017 81.9  80.0 - 100.0 fL Final  . MCH 06/25/2017 25.6* 27.0 - 33.0 pg Final  . MCHC 06/25/2017 31.3* 32.0 - 36.0 g/dL Final  . RDW 06/25/2017 16.8* 11.0 - 15.0 % Final  . Platelets 06/25/2017 388  140 - 400 K/uL Final  . MPV 06/25/2017 10.5  7.5 - 12.5 fL Final  . Neutro Abs 06/25/2017 2604  1,500 - 7,800 cells/uL Final  . Lymphs Abs 06/25/2017 1260  850 - 3,900 cells/uL Final  . Monocytes Absolute 06/25/2017 252  200 - 950 cells/uL Final  . Eosinophils Absolute 06/25/2017 42  15 - 500 cells/uL Final  . Basophils Absolute 06/25/2017 42  0 - 200 cells/uL Final  . Neutrophils Relative % 06/25/2017 62  % Final  . Lymphocytes Relative 06/25/2017 30  % Final  . Monocytes Relative 06/25/2017 6   % Final  . Eosinophils Relative 06/25/2017 1  % Final  . Basophils Relative 06/25/2017 1  % Final  . Smear Review 06/25/2017 Criteria for review not met   Final  . Sodium 06/25/2017 138  135 - 146 mmol/L Final  . Potassium 06/25/2017 4.4  3.5 - 5.3 mmol/L Final  . Chloride 06/25/2017 100  98 - 110 mmol/L Final  . CO2 06/25/2017 24  20 - 31 mmol/L Final  . Glucose, Bld 06/25/2017 80  65 - 99 mg/dL Final  . BUN 06/25/2017 14  7 - 25 mg/dL Final  . Creat 06/25/2017 0.98  0.50 - 1.05 mg/dL Final   Comment:   For patients > or = 59 years of age: The upper reference limit for Creatinine is approximately 13% higher for people identified as African-American.     . Total Bilirubin 06/25/2017 0.5  0.2 - 1.2 mg/dL Final  . Alkaline Phosphatase 06/25/2017 110  33 - 130 U/L Final  . AST 06/25/2017 28  10 - 35 U/L Final  . ALT 06/25/2017 20  6 - 29 U/L Final  . Total Protein 06/25/2017 7.9  6.1 - 8.1 g/dL Final  . Albumin 06/25/2017 4.2  3.6 - 5.1 g/dL Final  . Calcium 06/25/2017 9.8  8.6 - 10.4 mg/dL Final  . GFR, Est African American 06/25/2017 73  >=60 mL/min Final  . GFR, Est Non African American 06/25/2017 63  >=60 mL/min Final  . Interferon Gamma Release Assay 06/25/2017 NEGATIVE  NEGATIVE Final   Negative test result. M. tuberculosis complex infection unlikely.  . Quantiferon Nil Value 06/25/2017 0.08  IU/mL Final  . Mitogen-Nil 06/25/2017 6.66  IU/mL Final  . Quantiferon Tb Ag Minus Nil Value 06/25/2017 0.00  IU/mL Final   Comment:   The Nil tube value is used to determine if the patient has a preexisting immune response which could cause a false-positive reading on the test. In order for a test to be valid, the Nil tube must have a value of less than or equal to 8.0 IU/mL.   The mitogen control tube is used to assure the patient has a healthy immune status and also serves as a control for correct blood handling and incubation. It is used to detect false-negative readings.  The mitogen tube must have a gamma interferon value of greater than or equal to 0.5 IU/mL higher than the value of the Nil tube.   The TB antigen tube is coated with the M. tuberculosis specific antigens. For a test to be considered positive, the TB antigen tube value minus the Nil tube value must be greater than or equal to 0.35 IU/mL.   For additional information, please refer to http://education.questdiagnostics.com/faq/QFT (This link is being provided for informational/educational purposes only.)   . Uric Acid, Serum 06/25/2017 4.9  2.5 - 7.0 mg/dL Final     Imaging: No results found.  Speciality Comments: No specialty comments available.    Procedures:  No procedures performed Allergies: Penicillins and Sulfa antibiotics   Assessment / Plan:     Visit Diagnoses: Psoriatic arthritis ([place])  Other psoriasis  High risk medication use - adequate response with the current therapy ==>ENBREL Q WEEKMTX 4 pills every tuesday; folic acid 72m everyday.  Trapezius muscle spasm   Lan: #1: Psoriatic arthritis and psoriasis. Doing well. Adequate control with current therapy. History of right elbow joint with 50 contracture History of arthritis mutilans affecting the bilateral thumbs History of right second finger withsignificant angulation at the PIP joint (no new episodes of contractures, synovitis, joint pain swelling or stiffness).  #2: High risk prescription On Enbrel every week; methotrexate 4 pills per week; folic acid 1 mg daily. No joint pain swelling and stiffness  #3: Bilateral trapezius muscle spasms. Patient declines trigger point injection.  #4: Patient's labs are up-to-date. She came in yesterday to get her labs drawn. Her TB gold is negative in July 2018.  #5: Patient is due for repeat flu vaccine now. She has received a pneumonia vaccine last year. She will coordinate this with her PCP.  Orders: No orders of the defined types were placed  in this  encounter.  No orders of the defined types were placed in this encounter.   Face-to-face time spent with patient was 30 minutes. 50% of time was spent in counseling and coordination of care.  Follow-Up Instructions: Return in about 5 months (around 01/29/2018) for PsA,Ps,enbrel q wk, .   Eliezer Lofts, PA-C  Note - This record has been created using [employer].  Chart creation errors have been sought, but may not always  have been located. Such creation errors do not reflect on  the standard of medical care.

## 2017-09-22 ENCOUNTER — Other Ambulatory Visit: Payer: Self-pay | Admitting: Rheumatology

## 2017-09-22 NOTE — Telephone Encounter (Signed)
Last Visit: 08/31/17 Next Visit: 02/01/17 Labs: 06/25/17 WNL TB Gold: 06/25/17 Neg   Left message to remind patient she is due for labs.   Okay to refill 30 day supply per Dr. Estanislado Pandy

## 2017-09-23 ENCOUNTER — Other Ambulatory Visit: Payer: Self-pay

## 2017-09-23 ENCOUNTER — Telehealth: Payer: Self-pay

## 2017-09-23 ENCOUNTER — Other Ambulatory Visit: Payer: Self-pay | Admitting: *Deleted

## 2017-09-23 DIAGNOSIS — Z79899 Other long term (current) drug therapy: Secondary | ICD-10-CM

## 2017-09-23 LAB — COMPLETE METABOLIC PANEL WITH GFR
AG Ratio: 1.1 (calc) (ref 1.0–2.5)
ALKALINE PHOSPHATASE (APISO): 89 U/L (ref 33–130)
ALT: 13 U/L (ref 6–29)
AST: 20 U/L (ref 10–35)
Albumin: 4 g/dL (ref 3.6–5.1)
BUN: 15 mg/dL (ref 7–25)
CO2: 32 mmol/L (ref 20–32)
CREATININE: 0.72 mg/dL (ref 0.50–1.05)
Calcium: 9.3 mg/dL (ref 8.6–10.4)
Chloride: 101 mmol/L (ref 98–110)
GFR, EST AFRICAN AMERICAN: 106 mL/min/{1.73_m2} (ref >=60)
GFR, Est Non African American: 92 mL/min/{1.73_m2} (ref >=60)
GLUCOSE: 83 mg/dL (ref 65–99)
Globulin: 3.5 g/dL (calc) (ref 1.9–3.7)
Potassium: 4.8 mmol/L (ref 3.5–5.3)
Sodium: 139 mmol/L (ref 135–146)
TOTAL PROTEIN: 7.5 g/dL (ref 6.1–8.1)
Total Bilirubin: 0.5 mg/dL (ref 0.2–1.2)

## 2017-09-23 LAB — CBC WITH DIFFERENTIAL/PLATELET
BASOS ABS: 32 {cells}/uL (ref 0–200)
BASOS PCT: 0.8 %
EOS ABS: 72 {cells}/uL (ref 15–500)
EOS PCT: 1.8 %
HCT: 37.3 % (ref 35.0–45.0)
HEMOGLOBIN: 11.8 g/dL (ref 11.7–15.5)
LYMPHS ABS: 1560 {cells}/uL (ref 850–3900)
MCH: 25.3 pg — ABNORMAL LOW (ref 27.0–33.0)
MCHC: 31.6 g/dL — ABNORMAL LOW (ref 32.0–36.0)
MCV: 80 fL (ref 80.0–100.0)
MONOS PCT: 5.8 %
MPV: 12 fL (ref 7.5–12.5)
NEUTROS PCT: 52.6 %
Neutro Abs: 2104 cells/uL (ref 1500–7800)
Platelets: 297 10*3/uL (ref 140–400)
RBC: 4.66 10*6/uL (ref 3.80–5.10)
RDW: 14.8 % (ref 11.0–15.0)
Total Lymphocyte: 39 %
WBC: 4 10*3/uL (ref 3.8–10.8)
WBCMIX: 232 {cells}/uL (ref 200–950)

## 2017-09-23 NOTE — Telephone Encounter (Signed)
FYI- Patient called stating that she had labs done on 08/30/17.  Advised her that CMP and CBC needed to be collected. Patient stated that she will be in today for blood work.

## 2017-09-24 NOTE — Progress Notes (Signed)
Labs are stable.

## 2017-09-29 ENCOUNTER — Telehealth: Payer: Self-pay

## 2017-09-29 NOTE — Telephone Encounter (Signed)
Spoke with patient and advised her labs are stable. Patient also advised that a 30 day supply was sent to [facility] on 09/22/17. Patient advised 90 day supply will be sent with next refill. Patient verbalized understanding.

## 2017-09-29 NOTE — Telephone Encounter (Signed)
Patient would like a call back concerning her lab results and also a Rx refill on Enbrel sent to [facility].  Cb# [phone].  Thank you.

## 2017-09-29 NOTE — Telephone Encounter (Signed)
Attempted to contact the patient and left message for patient to call the office.  

## 2017-10-10 ENCOUNTER — Other Ambulatory Visit: Payer: Self-pay | Admitting: Rheumatology

## 2017-10-10 DIAGNOSIS — Z79899 Other long term (current) drug therapy: Secondary | ICD-10-CM

## 2017-10-11 NOTE — Telephone Encounter (Signed)
Last Visit: 08/31/17 Next Visit: 02/01/18 Labs: 09/23/17 Stable  Okay to refill per Dr. Estanislado Pandy

## 2017-10-18 ENCOUNTER — Other Ambulatory Visit: Payer: Self-pay | Admitting: Rheumatology

## 2017-10-18 NOTE — Telephone Encounter (Signed)
Last Visit: 08/31/17 Next Visit: 02/01/17 Labs: 09/23/17 stable  TB Gold: 06/25/17 Neg

## 2017-10-19 ENCOUNTER — Other Ambulatory Visit: Payer: Self-pay | Admitting: Obstetrics and Gynecology

## 2017-10-19 ENCOUNTER — Other Ambulatory Visit (HOSPITAL_COMMUNITY)
Admission: RE | Admit: 2017-10-19 | Discharge: 2017-10-19 | Disposition: A | Discharge status: Home / Self Care | Payer: BC Managed Care – PPO | Source: Ambulatory Visit | Attending: Obstetrics and Gynecology | Admitting: Obstetrics and Gynecology

## 2017-10-19 DIAGNOSIS — Z124 Encounter for screening for malignant neoplasm of cervix: Secondary | ICD-10-CM | POA: Diagnosis not present

## 2017-10-26 LAB — CYTOLOGY - PAP: HPV (WINDOPATH): NOT DETECTED

## 2017-11-03 ENCOUNTER — Other Ambulatory Visit: Payer: Self-pay | Admitting: Rheumatology

## 2017-11-17 ENCOUNTER — Other Ambulatory Visit: Payer: Self-pay | Admitting: Obstetrics and Gynecology

## 2017-12-14 ENCOUNTER — Telehealth: Payer: Self-pay | Admitting: *Deleted

## 2017-12-14 NOTE — Telephone Encounter (Signed)
Bone density  T-Score -3.6 Osteoporosis  Not on any treatment  Needs an appointment to discuss treatment options.

## 2017-12-27 NOTE — Telephone Encounter (Signed)
Patient advised of results and patient has an appointment on 02/01/18. Will discuss treatment options at that appointment.

## 2018-01-03 ENCOUNTER — Other Ambulatory Visit: Payer: Self-pay | Admitting: Rheumatology

## 2018-01-03 DIAGNOSIS — Z79899 Other long term (current) drug therapy: Secondary | ICD-10-CM

## 2018-01-03 NOTE — Telephone Encounter (Signed)
Last visit: 08/31/2017 Next visit: 02/01/2018 Labs: 09/23/2017 stable   Advised patient she is due for labs. Patient will come in tomorrow to have labs drawn but is requesting a 30 day supply be sent in.   Okay to refill 30 day supply per Lovena Le.

## 2018-01-05 ENCOUNTER — Other Ambulatory Visit: Payer: Self-pay | Admitting: Rheumatology

## 2018-01-05 NOTE — Telephone Encounter (Signed)
Last visit: 08/31/2017 Next visit: 02/01/2018 Labs: 09/23/2017 stable  TB Gold: 06/25/2017 Negative   Patient is aware she is due for labs. She will come in one day this week to have labs drawn.  Okay to refill 30 day supply per Dr. Estanislado Pandy.

## 2018-01-10 ENCOUNTER — Other Ambulatory Visit: Payer: Self-pay

## 2018-01-10 DIAGNOSIS — Z79899 Other long term (current) drug therapy: Secondary | ICD-10-CM

## 2018-01-10 LAB — COMPLETE METABOLIC PANEL WITH GFR
AG RATIO: 1 (calc) (ref 1.0–2.5)
ALT: 8 U/L (ref 6–29)
AST: 16 U/L (ref 10–35)
Albumin: 4 g/dL (ref 3.6–5.1)
Alkaline phosphatase (APISO): 107 U/L (ref 33–130)
BILIRUBIN TOTAL: 0.4 mg/dL (ref 0.2–1.2)
BUN: 13 mg/dL (ref 7–25)
CALCIUM: 10 mg/dL (ref 8.6–10.4)
CHLORIDE: 102 mmol/L (ref 98–110)
CO2: 32 mmol/L (ref 20–32)
Creat: 0.74 mg/dL (ref 0.50–0.99)
GFR, EST NON AFRICAN AMERICAN: 88 mL/min/{1.73_m2} (ref >=60)
GFR, Est African American: 102 mL/min/{1.73_m2} (ref >=60)
GLOBULIN: 4 g/dL — AB (ref 1.9–3.7)
Glucose, Bld: 85 mg/dL (ref 65–99)
POTASSIUM: 5.2 mmol/L (ref 3.5–5.3)
SODIUM: 143 mmol/L (ref 135–146)
Total Protein: 8 g/dL (ref 6.1–8.1)

## 2018-01-10 LAB — CBC WITH DIFFERENTIAL/PLATELET
BASOS ABS: 39 {cells}/uL (ref 0–200)
Basophils Relative: 1 %
EOS ABS: 211 {cells}/uL (ref 15–500)
EOS PCT: 5.4 %
HCT: 37.9 % (ref 35.0–45.0)
Hemoglobin: 12 g/dL (ref 11.7–15.5)
Lymphs Abs: 1252 cells/uL (ref 850–3900)
MCH: 25.1 pg — ABNORMAL LOW (ref 27.0–33.0)
MCHC: 31.7 g/dL — AB (ref 32.0–36.0)
MCV: 79.3 fL — ABNORMAL LOW (ref 80.0–100.0)
MONOS PCT: 9 %
MPV: 11.3 fL (ref 7.5–12.5)
Neutro Abs: 2048 cells/uL (ref 1500–7800)
Neutrophils Relative %: 52.5 %
PLATELETS: 352 10*3/uL (ref 140–400)
RBC: 4.78 10*6/uL (ref 3.80–5.10)
RDW: 14.3 % (ref 11.0–15.0)
TOTAL LYMPHOCYTE: 32.1 %
WBC mixed population: 351 cells/uL (ref 200–950)
WBC: 3.9 10*3/uL (ref 3.8–10.8)

## 2018-01-11 ENCOUNTER — Ambulatory Visit: Payer: BC Managed Care – PPO | Admitting: Rheumatology

## 2018-01-19 NOTE — Progress Notes (Signed)
Office Visit Note  Patient: Taylor Kirby             Date of Birth: 1956/04/28           MRN: [id number]             PCP: Harlan Stains, MD Referring: Harlan Stains, MD Visit Date: 02/01/2018 Occupation: @GUAROCC @    Subjective:  Medication Management   History of Present Illness: Taylor Kirby is a 62 y.o. female with history of psoriatic arthritis.  She states she has been doing quite well on Enbrel.  She denies any joint swelling.  She has been having some stiffness in her C-spine.  She been taking Zyrtec for allergies.  Activities of Daily Living:  Patient reports morning stiffness for 2 minute.   Patient Denies nocturnal pain.  Difficulty dressing/grooming: Denies Difficulty climbing stairs: Denies Difficulty getting out of chair: Denies Difficulty using hands for taps, buttons, cutlery, and/or writing: Denies   Review of Systems  Constitutional: Negative for fatigue, night sweats, weight gain, weight loss and weakness.  HENT: Negative for mouth sores, trouble swallowing, trouble swallowing, mouth dryness and nose dryness.   Eyes: Negative for pain, redness, visual disturbance and dryness.  Respiratory: Negative for cough, shortness of breath and difficulty breathing.   Cardiovascular: Negative for chest pain, palpitations, hypertension, irregular heartbeat and swelling in legs/feet.  Gastrointestinal: Negative for blood in stool, constipation and diarrhea.  Endocrine: Negative for increased urination.  Genitourinary: Negative for vaginal dryness.  Musculoskeletal: Positive for arthralgias, joint pain and morning stiffness. Negative for joint swelling, myalgias, muscle weakness, muscle tenderness and myalgias.  Skin: Negative for color change, rash, hair loss, skin tightness, ulcers and sensitivity to sunlight.  Allergic/Immunologic: Negative for susceptible to infections.  Neurological: Negative for dizziness, memory loss and night sweats.  Hematological:  Negative for swollen glands.  Psychiatric/Behavioral: Negative for depressed mood and sleep disturbance. The patient is not nervous/anxious.     PMFS History:  Patient Active Problem List   Diagnosis Date Noted  . History of anemia 03/10/2017  . 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 Anemia 10/05/2016  . Osteopenia 10/05/2016  . 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 High risk medication use 10/03/2016  . Psoriatic arthritis ([place]) 10/25/2015    Past Medical History:  Diagnosis Date  . Allergy   . Anemia   . Heart murmur   . Rheumatoid arthritis ([place])     Family History  Problem Relation Age of Onset  . Hyperlipidemia Mother   . Hypertension Mother   . Stroke Mother   . Hyperlipidemia Father   . Cancer Father   . Hypertension Sister   . Heart disease Maternal Grandmother   . Cancer Maternal Grandfather   . Hypertension Paternal Grandmother    Past Surgical History:  Procedure Laterality Date  . TUBAL LIGATION     Social History   Social History Narrative  . Not on file     Objective: Vital Signs: BP 116/81 (BP Location: Left Arm, Patient Position: Sitting, Cuff Size: Normal)   Pulse 88   Resp 15   Ht 5\' 4"  (1.626 m)   Wt 121 lb (54.9 kg)   BMI 20.77 kg/m    Physical Exam  Constitutional: She is oriented to person, place, and time. She appears well-developed and well-nourished.  HENT:  Head: Normocephalic and atraumatic.  Eyes: Conjunctivae and EOM are normal.  Neck: Normal range of motion.  Cardiovascular: Normal rate, regular rhythm, normal heart sounds  and intact distal pulses.  Pulmonary/Chest: Effort normal and breath sounds normal.  Abdominal: Soft. Bowel sounds are normal.  Lymphadenopathy:    She has no cervical adenopathy.  Neurological: She is alert and oriented to person, place, and time.  Skin: Skin is warm and dry. Capillary refill takes less than 2 seconds.  Psychiatric: She has a normal mood and affect. Her behavior is normal.  Nursing  note and vitals reviewed.    Musculoskeletal Exam: C-spine thoracic lumbar spine good range of motion.  Shoulder joints are good range of motion.  She has right elbow joint contracture.  She has arthritis mutilans changes in her hands with multiple deformities but no active synovitis.  Hip joints knee joints ankles with good range of motion.  CDAI Exam: CDAI Homunculus Exam:   Joint Counts:  CDAI Tender Joint count: 0 CDAI Swollen Joint count: 0  Global Assessments:  Patient Global Assessment: 1 Provider Global Assessment: 1  CDAI Calculated Score: 2    Investigation: No additional findings.TB Gold: 06/25/2017 Negative  CBC Latest Ref Rng & Units 01/10/2018 09/23/2017 06/25/2017  WBC 3.8 - 10.8 Thousand/uL 3.9 4.0 4.2  Hemoglobin 11.7 - 15.5 g/dL 12.0 11.8 12.9  Hematocrit 35.0 - 45.0 % 37.9 37.3 41.2  Platelets 140 - 400 Thousand/uL 352 297 388   CMP Latest Ref Rng & Units 01/10/2018 09/23/2017 06/25/2017  Glucose 65 - 99 mg/dL 85 83 80  BUN 7 - 25 mg/dL 13 15 14   Creatinine 0.50 - 0.99 mg/dL 0.74 0.72 0.98  Sodium 135 - 146 mmol/L 143 139 138  Potassium 3.5 - 5.3 mmol/L 5.2 4.8 4.4  Chloride 98 - 110 mmol/L 102 101 100  CO2 20 - 32 mmol/L 32 32 24  Calcium 8.6 - 10.4 mg/dL 10.0 9.3 9.8  Total Protein 6.1 - 8.1 g/dL 8.0 7.5 7.9  Total Bilirubin 0.2 - 1.2 mg/dL 0.4 0.5 0.5  Alkaline Phos 33 - 130 U/L - - 110  AST 10 - 35 U/L 16 20 28   ALT 6 - 29 U/L 8 13 20     Imaging: No results found.  Speciality Comments: No specialty comments available.    Procedures:  No procedures performed Allergies: Penicillins and Sulfa antibiotics   Assessment / Plan:     Visit Diagnoses: Psoriatic arthritis ([place]) - Arthritis mutilans. She has some contractures and deformities in her joints.  She has no active synovitis.  Her arthritis seems to be quite well controlled on current regimen.  High risk medication use - Enbrel 50 mg subcutaneous every week, methotrexate 4 tablets p.o. weekly  , folic acid 1 mg p.o. daily.  Her labs have been stable.  We will continue to monitor her labs every 3 months.  Contracture of elbow joint, right: Unchanged.  Age-related osteoporosis without current pathological fracture - T score -3.6 in January 2019.  We had detailed discussion regarding osteoporosis.  Use of calcium vitamin D and resistive exercises was discussed.  Different treatment options and their side effects were discussed.  She is willing to proceed with Fosamax 70 mg p.o. weekly prescription will be called in.  Instructions were given regarding medication usage.  If she has intolerance or an adequate response then we can try Reclast IV.  Renal calcinosis: Patient is uncertain about what kind of renal calcinosis she has.  I have advised her to discuss this further with her urologist for prevention of renal calcinosis while she will be taking calcium vitamin D and Fosamax.  History of  anemia    Orders: No orders of the defined types were placed in this encounter.  Meds ordered this encounter  Medications  . alendronate (FOSAMAX) 70 MG tablet    Sig: Take 1 tablet (70 mg total) by mouth once a week. Take with a full glass of water on an empty stomach.    Dispense:  12 tablet    Refill:  0  . methotrexate (RHEUMATREX) 2.5 MG tablet    Sig: TAKE 4 TABLETS BY MOUTH EVERY WEEK    Dispense:  48 tablet    Refill:  0    Face-to-face time spent with patient was 30 minutes.  Greater than 50% of time was spent in counseling and coordination of care.  Follow-Up Instructions: Return in about 4 months (around 06/03/2018) for Psoriatic arthritis.   Bo Merino, MD  Note - This record has been created using [employer].  Chart creation errors have been sought, but may not always  have been located. Such creation errors do not reflect on  the standard of medical care.

## 2018-01-25 ENCOUNTER — Ambulatory Visit: Payer: BC Managed Care – PPO | Admitting: Rheumatology

## 2018-01-27 ENCOUNTER — Other Ambulatory Visit: Payer: Self-pay | Admitting: Rheumatology

## 2018-01-27 NOTE — Telephone Encounter (Signed)
Last visit: 08/31/2017 Next visit: 02/01/2018 Labs: 01/10/2018 stable  Tb Gold: 06/25/2017   Okay to refill per Dr. Estanislado Pandy.

## 2018-01-29 ENCOUNTER — Other Ambulatory Visit: Payer: Self-pay | Admitting: Physician Assistant

## 2018-01-29 DIAGNOSIS — Z79899 Other long term (current) drug therapy: Secondary | ICD-10-CM

## 2018-02-01 ENCOUNTER — Encounter: Payer: Self-pay | Admitting: Rheumatology

## 2018-02-01 ENCOUNTER — Ambulatory Visit: Payer: BC Managed Care – PPO | Admitting: Rheumatology

## 2018-02-01 ENCOUNTER — Encounter (INDEPENDENT_AMBULATORY_CARE_PROVIDER_SITE_OTHER): Payer: Self-pay

## 2018-02-01 VITALS — BP 116/81 | HR 88 | Resp 15 | Ht 64.0 in | Wt 121.0 lb

## 2018-02-01 DIAGNOSIS — Z862 Personal history of diseases of the blood and blood-forming organs and certain disorders involving the immune mechanism: Secondary | ICD-10-CM

## 2018-02-01 DIAGNOSIS — M810 Age-related osteoporosis without current pathological fracture: Secondary | ICD-10-CM

## 2018-02-01 DIAGNOSIS — L4050 Arthropathic psoriasis, unspecified: Secondary | ICD-10-CM

## 2018-02-01 DIAGNOSIS — Z79899 Other long term (current) drug therapy: Secondary | ICD-10-CM

## 2018-02-01 DIAGNOSIS — M24521 Contracture, right elbow: Secondary | ICD-10-CM | POA: Diagnosis not present

## 2018-02-01 DIAGNOSIS — N29 Other disorders of kidney and ureter in diseases classified elsewhere: Secondary | ICD-10-CM

## 2018-02-01 MED ORDER — METHOTREXATE 2.5 MG PO TABS: ORAL_TABLET | ORAL | 0 refills | Status: DC

## 2018-02-01 MED ORDER — ALENDRONATE SODIUM 70 MG PO TABS
70.0000 mg | ORAL_TABLET | ORAL | 0 refills | Status: DC
Start: 2018-02-01 — End: 2018-04-18

## 2018-02-01 NOTE — Patient Instructions (Addendum)
Alendronate tablets What is this medicine? ALENDRONATE (a LEN droe nate) slows calcium loss from bones. It helps to make normal healthy bone and to slow bone loss in people with Paget's disease and osteoporosis. It may be used in others at risk for bone loss. This medicine may be used for other purposes; ask your health care provider or pharmacist if you have questions. COMMON BRAND NAME(S): Fosamax What should I tell my health care provider before I take this medicine? They need to know if you have any of these conditions: -dental disease -esophagus, stomach, or intestine problems, like acid reflux or GERD -kidney disease -low blood calcium -low vitamin D -problems sitting or standing 30 minutes -trouble swallowing -an unusual or allergic reaction to alendronate, other medicines, foods, dyes, or preservatives -pregnant or trying to get pregnant -breast-feeding How should I use this medicine? You must take this medicine exactly as directed or you will lower the amount of the medicine you absorb into your body or you may cause yourself harm. Take this medicine by mouth first thing in the morning, after you are up for the day. Do not eat or drink anything before you take your medicine. Swallow the tablet with a full glass (6 to 8 fluid ounces) of plain water. Do not take this medicine with any other drink. Do not chew or crush the tablet. After taking this medicine, do not eat breakfast, drink, or take any medicines or vitamins for at least 30 minutes. Sit or stand up for at least 30 minutes after you take this medicine; do not lie down. Do not take your medicine more often than directed. Talk to your pediatrician regarding the use of this medicine in children. Special care may be needed. Overdosage: If you think you have taken too much of this medicine contact a poison control center or emergency room at once. NOTE: This medicine is only for you. Do not share this medicine with others. What if I  miss a dose? If you miss a dose, do not take it later in the day. Continue your normal schedule starting the next morning. Do not take double or extra doses. What may interact with this medicine? -aluminum hydroxide -antacids -aspirin -calcium supplements -drugs for inflammation like ibuprofen, naproxen, and others -iron supplements -magnesium supplements -vitamins with minerals This list may not describe all possible interactions. Give your health care provider a list of all the medicines, herbs, non-prescription drugs, or dietary supplements you use. Also tell them if you smoke, drink alcohol, or use illegal drugs. Some items may interact with your medicine. What should I watch for while using this medicine? Visit your doctor or health care professional for regular checks ups. It may be some time before you see benefit from this medicine. Do not stop taking your medicine except on your doctor's advice. Your doctor or health care professional may order blood tests and other tests to see how you are doing. You should make sure you get enough calcium and vitamin D while you are taking this medicine, unless your doctor tells you not to. Discuss the foods you eat and the vitamins you take with your health care professional. Some people who take this medicine have severe bone, joint, and/or muscle pain. This medicine may also increase your risk for a broken thigh bone. Tell your doctor right away if you have pain in your upper leg or groin. Tell your doctor if you have any pain that does not go away or that gets worse.  This medicine can make you more sensitive to the sun. If you get a rash while taking this medicine, sunlight may cause the rash to get worse. Keep out of the sun. If you cannot avoid being in the sun, wear protective clothing and use sunscreen. Do not use sun lamps or tanning beds/booths. What side effects may I notice from receiving this medicine? Side effects that you should report to  your doctor or health care professional as soon as possible: -allergic reactions like skin rash, itching or hives, swelling of the face, lips, or tongue -black or tarry stools -bone, muscle or joint pain -changes in vision -chest pain -heartburn or stomach pain -jaw pain, especially after dental work -pain or trouble when swallowing -redness, blistering, peeling or loosening of the skin, including inside the mouth Side effects that usually do not require medical attention (report to your doctor or health care professional if they continue or are bothersome): -changes in taste -diarrhea or constipation -eye pain or itching -headache -nausea or vomiting -stomach gas or fullness This list may not describe all possible side effects. Call your doctor for medical advice about side effects. You may report side effects to FDA at 1-800-FDA-1088. Where should I keep my medicine? Keep out of the reach of children. Store at room temperature of 15 and 30 degrees C (59 and 86 degrees F). Throw away any unused medicine after the expiration date. NOTE: This sheet is a summary. It may not cover all possible information. If you have questions about this medicine, talk to your doctor, pharmacist, or health care provider.  2018 Elsevier/Gold Standard (2011-05-15 08:56:09) Standing Labs We placed an order today for your standing lab work.    Please come back and get your standing labs in May and every 3 months  We have open lab Monday through Friday from 8:30-11:30 AM and 1:30-4 PM at the office of Dr. Bo Merino.   The office is located at [address] [place], [place], [place] [address] No appointment is necessary.   Labs are drawn by [facility].  You may receive a bill from [facility] for your lab work. If you have any questions regarding directions or hours of operation,  please call [phone].

## 2018-02-02 ENCOUNTER — Other Ambulatory Visit: Payer: Self-pay | Admitting: Rheumatology

## 2018-02-02 DIAGNOSIS — Z79899 Other long term (current) drug therapy: Secondary | ICD-10-CM

## 2018-02-03 NOTE — Telephone Encounter (Signed)
Last Visit: 02/01/18 Next Visit: 06/22/18 Labs: 01/10/18 Stable  Okay to refill per Dr. Estanislado Pandy

## 2018-02-04 ENCOUNTER — Other Ambulatory Visit: Payer: Self-pay

## 2018-02-04 ENCOUNTER — Emergency Department
Admission: EM | Admit: 2018-02-04 | Discharge: 2018-02-04 | Disposition: A | Discharge status: Home / Self Care | Payer: BC Managed Care – PPO | Attending: Student in an Organized Health Care Education/Training Program | Admitting: Student in an Organized Health Care Education/Training Program

## 2018-02-04 ENCOUNTER — Emergency Department: Payer: BC Managed Care – PPO

## 2018-02-04 ENCOUNTER — Other Ambulatory Visit: Payer: Self-pay | Admitting: *Deleted

## 2018-02-04 DIAGNOSIS — Z79899 Other long term (current) drug therapy: Secondary | ICD-10-CM | POA: Insufficient documentation

## 2018-02-04 DIAGNOSIS — R55 Syncope and collapse: Secondary | ICD-10-CM

## 2018-02-04 DIAGNOSIS — E860 Dehydration: Secondary | ICD-10-CM | POA: Diagnosis not present

## 2018-02-04 LAB — URINALYSIS, COMPLETE (UACMP) WITH MICROSCOPIC
BILIRUBIN URINE: NEGATIVE
Bacteria, UA: NONE SEEN
Glucose, UA: NEGATIVE mg/dL
Ketones, ur: NEGATIVE mg/dL
Nitrite: NEGATIVE
PROTEIN: 100 mg/dL — AB
SPECIFIC GRAVITY, URINE: 1.016 (ref 1.005–1.030)
pH: 5 (ref 5.0–8.0)

## 2018-02-04 LAB — COMPREHENSIVE METABOLIC PANEL
ALT: 13 U/L — AB (ref 14–54)
AST: 22 U/L (ref 15–41)
Albumin: 3.7 g/dL (ref 3.5–5.0)
Alkaline Phosphatase: 95 U/L (ref 38–126)
Anion gap: 9 (ref 5–15)
BUN: 19 mg/dL (ref 6–20)
CALCIUM: 9.1 mg/dL (ref 8.9–10.3)
CHLORIDE: 101 mmol/L (ref 101–111)
CO2: 29 mmol/L (ref 22–32)
CREATININE: 0.85 mg/dL (ref 0.44–1.00)
Glucose, Bld: 140 mg/dL — ABNORMAL HIGH (ref 65–99)
Potassium: 3.7 mmol/L (ref 3.5–5.1)
Sodium: 139 mmol/L (ref 135–145)
Total Bilirubin: 0.5 mg/dL (ref 0.3–1.2)
Total Protein: 8.2 g/dL — ABNORMAL HIGH (ref 6.5–8.1)

## 2018-02-04 LAB — CBC WITH DIFFERENTIAL/PLATELET
BASOS PCT: 1 %
Basophils Absolute: 0 10*3/uL (ref 0–0.1)
EOS ABS: 0.1 10*3/uL (ref 0–0.7)
EOS PCT: 3 %
HCT: 36.6 % (ref 35.0–47.0)
HEMOGLOBIN: 11.7 g/dL — AB (ref 12.0–16.0)
Lymphocytes Relative: 26 %
Lymphs Abs: 1.4 10*3/uL (ref 1.0–3.6)
MCH: 25.5 pg — ABNORMAL LOW (ref 26.0–34.0)
MCHC: 31.9 g/dL — ABNORMAL LOW (ref 32.0–36.0)
MCV: 79.9 fL — ABNORMAL LOW (ref 80.0–100.0)
Monocytes Absolute: 0.3 10*3/uL (ref 0.2–0.9)
Monocytes Relative: 5 %
NEUTROS PCT: 65 %
Neutro Abs: 3.5 10*3/uL (ref 1.4–6.5)
PLATELETS: 343 10*3/uL (ref 150–440)
RBC: 4.59 MIL/uL (ref 3.80–5.20)
RDW: 15.7 % — ABNORMAL HIGH (ref 11.5–14.5)
WBC: 5.3 10*3/uL (ref 3.6–11.0)

## 2018-02-04 LAB — TROPONIN I

## 2018-02-04 MED ORDER — FOLIC ACID 1 MG PO TABS: 1.0000 mg | ORAL_TABLET | Freq: Every day | ORAL | 3 refills | Status: DC

## 2018-02-04 NOTE — ED Provider Notes (Signed)
[facility] Emergency Department Provider Note    None    (approximate)  I have reviewed the triage vital signs and the nursing notes.   HISTORY  Chief Complaint Loss of Consciousness    HPI Taylor Kirby is a 62 y.o. female presents for evaluation after 2 syncopal episodes.  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  And states that she had "overdressed" and started feeling hot when they are walking around several wings.  Went into a much warmer room.  Started feeling flushed and has to sit down.  When she sat down and put her head down to try to "regain her composure "patient passed out.  This is only for a few seconds.  Denies any chest pain or palpitations.  Did feel diaphoretic.  No shortness of breath.  No numbness or tingling.  EMS was called while she is moving into the ambulance had a additional fainting spell again for less than 1 minute.  There is no shaking spells or seizure-like activity.  No dysrhythmia on monitor.  Denies any chest pain.  After receiving 250 bolus of IV fluid patient states that she is feeling better.  Had not had anything to eat since breakfast at 9 AM and this occurred at 3.   Past Medical History:  Diagnosis Date  . Allergy   . Anemia   . Heart murmur   . Rheumatoid arthritis ([place])    Family History  Problem Relation Age of Onset  . Hyperlipidemia Mother   . Hypertension Mother   . Stroke Mother   . Hyperlipidemia Father   . Cancer Father   . Hypertension Sister   . Heart disease Maternal Grandmother   . Cancer Maternal Grandfather   . Hypertension Paternal Grandmother    Past Surgical History:  Procedure Laterality Date  . TUBAL LIGATION     Patient Active Problem List   Diagnosis Date Noted  . History of anemia 03/10/2017  . 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 Anemia 10/05/2016  . Osteopenia 10/05/2016  . 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  High risk medication use 10/03/2016  . Psoriatic arthritis ([place]) 10/25/2015      Prior to Admission medications   Medication Sig Start Date End Date Taking? Authorizing Provider  albuterol (PROVENTIL) (2.5 MG/3ML) 0.083% nebulizer solution Take 3 mLs (2.5 mg total) by nebulization every 6 (six) hours as needed for wheezing or shortness of breath. 04/01/14   Gregor Hams, MD  alendronate (FOSAMAX) 70 MG tablet Take 1 tablet (70 mg total) by mouth once a week. Take with a full glass of water on an empty stomach. 02/01/18   Bo Merino, MD  cetirizine (ZYRTEC) 10 MG tablet Take 10 mg by mouth daily.    [provider]  ENBREL 50 MG/ML injection INJECT ONE SYRINGE SUBCUTANEOUSLY ONCE A WEEK. REFRIGERATE. DO NOT FREEZE. 01/27/18   Bo Merino, MD  folic acid (FOLVITE) 1 MG tablet Take 1 tablet (1 mg total) by mouth daily. 02/04/18   Bo Merino, MD  magnesium 30 MG tablet Take 30 mg by mouth 2 (two) times daily.    [provider]  methotrexate (RHEUMATREX) 2.5 MG tablet TAKE 4 TABLETS BY MOUTH EVERY WEEK 02/03/18   Bo Merino, MD  Multiple Vitamins-Minerals (MULTIVITAMIN ADULT PO) Take by mouth daily.    [provider]  Omega-3 Fatty Acids (OMEGA 3 PO) Take by mouth daily.  [provider]  vitamin B-12 (CYANOCOBALAMIN) 100 MCG tablet Take 100 mcg by mouth daily.    [provider]    Allergies Penicillins and Sulfa antibiotics    Social History Social History   Tobacco Use  . Smoking status: Never Smoker  . Smokeless tobacco: Never Used  Substance Use Topics  . Alcohol use: Yes    Comment: Socially   . Drug use: No    Review of Systems Patient denies headaches, rhinorrhea, blurry vision, numbness, shortness of breath, chest pain, edema, cough, abdominal pain, nausea, vomiting, diarrhea, dysuria, fevers, rashes or hallucinations unless otherwise stated above in  HPI. ____________________________________________   PHYSICAL EXAM:  VITAL SIGNS: Vitals:   02/04/18 1609  BP: 119/80  Pulse: 78  Resp: 16  Temp: 97.9 F (36.6 C)  SpO2: 100%    Constitutional: Alert and oriented. Well appearing and in no acute distress. Eyes: Conjunctivae are normal.  Head: Atraumatic. Nose: No congestion/rhinnorhea. Mouth/Throat: Mucous membranes are moist.   Neck: No stridor. Painless ROM.  Cardiovascular: Normal rate, regular rhythm. Grossly normal heart sounds.  Good peripheral circulation. Respiratory: Normal respiratory effort.  No retractions. Lungs CTAB. Gastrointestinal: Soft and nontender. No distention. No abdominal bruits. No CVA tenderness. Genitourinary:  Musculoskeletal: No lower extremity tenderness nor edema.  No joint effusions. Neurologic:  Normal speech and language. No gross focal neurologic deficits are appreciated. No facial droop Skin:  Skin is warm, dry and intact. No rash noted. Psychiatric: Mood and affect are normal. Speech and behavior are normal.  ____________________________________________   LABS (all labs ordered are listed, but only abnormal results are displayed)  Results for orders placed or performed during the hospital encounter of 02/04/18 (from the past 24 hour(s))  CBC with Differential/Platelet     Status: Abnormal   Collection Time: 02/04/18  4:18 PM  Result Value Ref Range   WBC 5.3 3.6 - 11.0 K/uL   RBC 4.59 3.80 - 5.20 MIL/uL   Hemoglobin 11.7 (L) 12.0 - 16.0 g/dL   HCT 36.6 35.0 - 47.0 %   MCV 79.9 (L) 80.0 - 100.0 fL   MCH 25.5 (L) 26.0 - 34.0 pg   MCHC 31.9 (L) 32.0 - 36.0 g/dL   RDW 15.7 (H) 11.5 - 14.5 %   Platelets 343 150 - 440 K/uL   Neutrophils Relative % 65 %   Neutro Abs 3.5 1.4 - 6.5 K/uL   Lymphocytes Relative 26 %   Lymphs Abs 1.4 1.0 - 3.6 K/uL   Monocytes Relative 5 %   Monocytes Absolute 0.3 0.2 - 0.9 K/uL   Eosinophils Relative 3 %   Eosinophils Absolute 0.1 0 - 0.7 K/uL    Basophils Relative 1 %   Basophils Absolute 0.0 0 - 0.1 K/uL  Troponin I     Status: None   Collection Time: 02/04/18  4:18 PM  Result Value Ref Range   Troponin I <0.03 <0.03 ng/mL  Comprehensive metabolic panel     Status: Abnormal   Collection Time: 02/04/18  4:18 PM  Result Value Ref Range   Sodium 139 135 - 145 mmol/L   Potassium 3.7 3.5 - 5.1 mmol/L   Chloride 101 101 - 111 mmol/L   CO2 29 22 - 32 mmol/L   Glucose, Bld 140 (H) 65 - 99 mg/dL   BUN 19 6 - 20 mg/dL   Creatinine, Ser 0.85 0.44 - 1.00 mg/dL   Calcium 9.1 8.9 - 10.3 mg/dL   Total Protein 8.2 (H) 6.5 -  8.1 g/dL   Albumin 3.7 3.5 - 5.0 g/dL   AST 22 15 - 41 U/L   ALT 13 (L) 14 - 54 U/L   Alkaline Phosphatase 95 38 - 126 U/L   Total Bilirubin 0.5 0.3 - 1.2 mg/dL   GFR calc non Af Amer >60 >60 mL/min   GFR calc Af Amer >60 >60 mL/min   Anion gap 9 5 - 15  Urinalysis, Complete w Microscopic     Status: Abnormal   Collection Time: 02/04/18  5:06 PM  Result Value Ref Range   Color, Urine YELLOW (A) YELLOW   APPearance HAZY (A) CLEAR   Specific Gravity, Urine 1.016 1.005 - 1.030   pH 5.0 5.0 - 8.0   Glucose, UA NEGATIVE NEGATIVE mg/dL   Hgb urine dipstick MODERATE (A) NEGATIVE   Bilirubin Urine NEGATIVE NEGATIVE   Ketones, ur NEGATIVE NEGATIVE mg/dL   Protein, ur 100 (A) NEGATIVE mg/dL   Nitrite NEGATIVE NEGATIVE   Leukocytes, UA TRACE (A) NEGATIVE   RBC / HPF TOO NUMEROUS TO COUNT 0 - 5 RBC/hpf   WBC, UA 6-30 0 - 5 WBC/hpf   Bacteria, UA NONE SEEN NONE SEEN   Squamous Epithelial / LPF 0-5 (A) NONE SEEN   WBC Clumps PRESENT    Mucus PRESENT    Hyaline Casts, UA PRESENT    ____________________________________________  EKG My review and personal interpretation at Time: 16:11   Indication: syncope  Rate: 70  Rhythm: sinus Axis: normal Other: normal intervals, no stemi ____________________________________________  RADIOLOGY  I personally reviewed all radiographic images ordered to evaluate for the above  acute complaints and reviewed radiology reports and findings.  These findings were personally discussed with the patient.  Please see medical record for radiology report.  ____________________________________________   PROCEDURES  Procedure(s) performed:  Procedures    Critical Care performed: no ____________________________________________   INITIAL IMPRESSION / ASSESSMENT AND PLAN / ED COURSE  Pertinent labs & imaging results that were available during my care of the patient were reviewed by me and considered in my medical decision making (see chart for details).  DDX: Dysrhythmia, dehydration, vasovagal, anemia, infection, seizure  Taylor Kirby is a 62 y.o. who presents to the ED with symptoms as described above.  Patient well-appearing.  EKG shows no evidence of dysrhythmia or preexcitation syndrome.  Blood work sent for the above differential shows no evidence of abnormal electrolytes.  And patient was borderline orthostatic vital signs.  Patient tolerating oral hydration.  Patient able to ambulate without any worsening lightheadedness after IV fluids.  Not clinically consistent with CVA or seizure.  No evidence of acute blood loss anemia or signs or symptoms of GI bleed.  This point I do believe patient was dehydrated and is stable for outpatient follow-up.      ____________________________________________   FINAL CLINICAL IMPRESSION(S) / ED DIAGNOSES  Final diagnoses:  Syncope and collapse  Dehydration      NEW MEDICATIONS STARTED DURING THIS VISIT:  New Prescriptions   No medications on file     Note:  This document was prepared using Dragon voice recognition software and may include unintentional dictation errors.    Merlyn Lot, MD 02/04/18 [id number]

## 2018-02-04 NOTE — Discharge Instructions (Signed)
Please follow-up with Dr. Dema Severin.  Be sure to drink plenty of fluids.  Return to the ER for any worsening pain, shortness of breath, lightheadedness or for any additional questions or concerns.

## 2018-02-04 NOTE — ED Triage Notes (Signed)
Pt was out doing a tour of a facility and began to feel hot and passed out per husband, once EMS arrived she had a second episode of syncope, alert and oriented on arrival here

## 2018-02-04 NOTE — ED Notes (Signed)
Orthostatic vital signs Lying- 117/75  HR- 74 Sitting- 124/72  HR- 70 Standing- 109/80  HR- 85

## 2018-02-04 NOTE — Telephone Encounter (Signed)
Refill request received via fax  Last Visit: 02/01/18 Next Visit: 06/22/18  Okay to refill per Dr. Estanislado Pandy

## 2018-04-18 ENCOUNTER — Other Ambulatory Visit: Payer: Self-pay | Admitting: Rheumatology

## 2018-04-18 NOTE — Telephone Encounter (Signed)
Last Visit: 02/01/18 Next Visit: 06/22/18 Labs: 01/10/18 Stable  Okay to refill per Dr. Estanislado Pandy

## 2018-05-04 ENCOUNTER — Other Ambulatory Visit: Payer: Self-pay | Admitting: Rheumatology

## 2018-05-04 NOTE — Telephone Encounter (Addendum)
Last Visit: 02/01/18 Next Visit: 06/22/18 Labs: 02/04/18 stable TB Gold: 06/25/17 Neg   Okay to refill 30 day supply per Dr. Estanislado Pandy

## 2018-05-06 ENCOUNTER — Other Ambulatory Visit: Payer: Self-pay

## 2018-05-06 DIAGNOSIS — Z79899 Other long term (current) drug therapy: Secondary | ICD-10-CM

## 2018-05-07 LAB — COMPLETE METABOLIC PANEL WITH GFR
AG RATIO: 1.1 (calc) (ref 1.0–2.5)
ALBUMIN MSPROF: 4 g/dL (ref 3.6–5.1)
ALKALINE PHOSPHATASE (APISO): 97 U/L (ref 33–130)
ALT: 12 U/L (ref 6–29)
AST: 20 U/L (ref 10–35)
BILIRUBIN TOTAL: 0.3 mg/dL (ref 0.2–1.2)
BUN: 18 mg/dL (ref 7–25)
CHLORIDE: 103 mmol/L (ref 98–110)
CO2: 33 mmol/L — ABNORMAL HIGH (ref 20–32)
Calcium: 10 mg/dL (ref 8.6–10.4)
Creat: 0.92 mg/dL (ref 0.50–0.99)
GFR, EST AFRICAN AMERICAN: 78 mL/min/{1.73_m2} (ref >=60)
GFR, Est Non African American: 68 mL/min/{1.73_m2} (ref >=60)
GLOBULIN: 3.6 g/dL (ref 1.9–3.7)
GLUCOSE: 83 mg/dL (ref 65–99)
POTASSIUM: 4.9 mmol/L (ref 3.5–5.3)
SODIUM: 141 mmol/L (ref 135–146)
TOTAL PROTEIN: 7.6 g/dL (ref 6.1–8.1)

## 2018-05-07 LAB — CBC WITH DIFFERENTIAL/PLATELET
BASOS PCT: 1 %
Basophils Absolute: 48 cells/uL (ref 0–200)
EOS ABS: 130 {cells}/uL (ref 15–500)
EOS PCT: 2.7 %
HCT: 37.8 % (ref 35.0–45.0)
HEMOGLOBIN: 11.9 g/dL (ref 11.7–15.5)
Lymphs Abs: 1392 cells/uL (ref 850–3900)
MCH: 24.7 pg — AB (ref 27.0–33.0)
MCHC: 31.5 g/dL — ABNORMAL LOW (ref 32.0–36.0)
MCV: 78.4 fL — ABNORMAL LOW (ref 80.0–100.0)
MONOS PCT: 7.9 %
MPV: 11.3 fL (ref 7.5–12.5)
NEUTROS ABS: 2851 {cells}/uL (ref 1500–7800)
Neutrophils Relative %: 59.4 %
PLATELETS: 339 10*3/uL (ref 140–400)
RBC: 4.82 10*6/uL (ref 3.80–5.10)
RDW: 14.8 % (ref 11.0–15.0)
TOTAL LYMPHOCYTE: 29 %
WBC mixed population: 379 cells/uL (ref 200–950)
WBC: 4.8 10*3/uL (ref 3.8–10.8)

## 2018-05-26 ENCOUNTER — Other Ambulatory Visit: Payer: Self-pay | Admitting: Rheumatology

## 2018-05-26 NOTE — Telephone Encounter (Addendum)
Last Visit: 02/01/18 Next Visit: 06/22/18 Labs: 05/06/18 Stable Tb Gold: 06/25/17 Neg   Okay to refill per Dr. Estanislado Pandy

## 2018-05-27 NOTE — Progress Notes (Signed)
Office Visit Note  Patient: Taylor Kirby             Date of Birth: 12/03/1955           MRN: [id number]             PCP: Harlan Stains, MD Referring: Harlan Stains, MD Visit Date: 06/08/2018 Occupation: @GUAROCC @    Subjective:  Medication management  History of Present Illness: Taylor Kirby is a 62 y.o. female with history of psoriatic arthritis.  Patient injects Enbrel subcutaneous once weekly, takes methotrexate 4 tablets by once weekly, and folic acid 1 mg daily.  Patient reports that she has been doing well on current combination of medication.  She denies any joint swelling or joint pain.  He has infrequent rash on her lower extremities sometimes.  Activities of Daily Living:  Patient reports morning stiffness for  none.   Patient Denies nocturnal pain.  Difficulty dressing/grooming: Denies Difficulty climbing stairs: Denies Difficulty getting out of chair: Denies Difficulty using hands for taps, buttons, cutlery, and/or writing: Denies   Review of Systems  Constitutional: Negative for activity change, fatigue, night sweats, weight gain and weight loss.  HENT: Negative for mouth sores, trouble swallowing, trouble swallowing, mouth dryness and nose dryness.   Eyes: Negative for pain, redness, visual disturbance and dryness.  Respiratory: Negative for cough, shortness of breath and difficulty breathing.   Cardiovascular: Negative for chest pain, palpitations, hypertension, irregular heartbeat and swelling in legs/feet.  Gastrointestinal: Negative for blood in stool, constipation and diarrhea.  Endocrine: Negative for cold intolerance and increased urination.  Genitourinary: Negative for difficulty urinating and vaginal dryness.  Musculoskeletal: Negative for arthralgias, joint pain, joint swelling, myalgias, muscle weakness, morning stiffness, muscle tenderness and myalgias.  Skin: Positive for rash. Negative for color change, hair loss, skin tightness, ulcers and  sensitivity to sunlight.  Allergic/Immunologic: Negative for susceptible to infections.  Neurological: Negative for dizziness, memory loss, night sweats and weakness.  Hematological: Negative for bruising/bleeding tendency and swollen glands.  Psychiatric/Behavioral: Negative for depressed mood and sleep disturbance. The patient is not nervous/anxious.     PMFS History:  Patient Active Problem List   Diagnosis Date Noted  . History of anemia 03/10/2017  . 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 Anemia 10/05/2016  . Osteopenia 10/05/2016  . 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 High risk medication use 10/03/2016  . Psoriatic arthritis ([place]) 10/25/2015    Past Medical History:  Diagnosis Date  . Allergy   . Anemia   . Heart murmur   . Osteoporosis   . Rheumatoid arthritis ([place])     Family History  Problem Relation Age of Onset  . Hyperlipidemia Mother   . Hypertension Mother   . Stroke Mother   . Hyperlipidemia Father   . Cancer Father   . Hypertension Sister   . Heart disease Maternal Grandmother   . Cancer Maternal Grandfather   . Hypertension Paternal Grandmother    Past Surgical History:  Procedure Laterality Date  . TUBAL LIGATION     Social History   Social History Narrative  . Not on file     Objective: Vital Signs: BP 125/80 (BP Location: Left Arm, Patient Position: Sitting, Cuff Size: Normal)   Pulse 75   Resp 14   Ht 5\' 4"  (1.626 m)   Wt 125 lb (56.7 kg)   BMI 21.46 kg/m    Physical Exam  Constitutional: She is oriented to person, place, and time. She appears  well-developed and well-nourished.  HENT:  Head: Normocephalic and atraumatic.  Eyes: Conjunctivae and EOM are normal.  Neck: Normal range of motion.  Cardiovascular: Normal rate, regular rhythm, normal heart sounds and intact distal pulses.  Pulmonary/Chest: Effort normal and breath sounds normal.  Abdominal: Soft. Bowel sounds are normal.  Lymphadenopathy:    She has no cervical  adenopathy.  Neurological: She is alert and oriented to person, place, and time.  Skin: Skin is warm and dry. Capillary refill takes less than 2 seconds.  Psychiatric: She has a normal mood and affect. Her behavior is normal.  Nursing note and vitals reviewed.    Musculoskeletal Exam: Spine thoracic lumbar spine good range of motion.  Shoulder joints with good range of motion.  She has 90 degrees contracture in the right elbow.  She has telescopic bilateral thumb.  She also has DIP PIP subluxation in her right hand without any synovitis.  Hip joints, knee joints, ankles MTPs PIPs were in good range of motion with no synovitis.   CDAI Exam: CDAI Homunculus Exam:   Joint Counts:  CDAI Tender Joint count: 0 CDAI Swollen Joint count: 0  Global Assessments:  Patient Global Assessment: 2 Provider Global Assessment: 2  CDAI Calculated Score: 4    Investigation: No additional findings. CBC Latest Ref Rng & Units 05/06/2018 02/04/2018 01/10/2018  WBC 3.8 - 10.8 Thousand/uL 4.8 5.3 3.9  Hemoglobin 11.7 - 15.5 g/dL 11.9 11.7(L) 12.0  Hematocrit 35.0 - 45.0 % 37.8 36.6 37.9  Platelets 140 - 400 Thousand/uL 339 343 352   CMP Latest Ref Rng & Units 05/06/2018 02/04/2018 01/10/2018  Glucose 65 - 99 mg/dL 83 140(H) 85  BUN 7 - 25 mg/dL 18 19 13   Creatinine 0.50 - 0.99 mg/dL 0.92 0.85 0.74  Sodium 135 - 146 mmol/L 141 139 143  Potassium 3.5 - 5.3 mmol/L 4.9 3.7 5.2  Chloride 98 - 110 mmol/L 103 101 102  CO2 20 - 32 mmol/L 33(H) 29 32  Calcium 8.6 - 10.4 mg/dL 10.0 9.1 10.0  Total Protein 6.1 - 8.1 g/dL 7.6 8.2(H) 8.0  Total Bilirubin 0.2 - 1.2 mg/dL 0.3 0.5 0.4  Alkaline Phos 38 - 126 U/L - 95 -  AST 10 - 35 U/L 20 22 16   ALT 6 - 29 U/L 12 13(L) 8     Imaging: No results found.  Speciality Comments: No specialty comments available.    Procedures:  No procedures performed Allergies: Penicillins and Sulfa antibiotics   Assessment / Plan:     Visit Diagnoses: Psoriatic arthritis  (HCC)-she is doing better on combination of Enbrel and methotrexate.  She had no synovitis on examination today.  High risk medication use - Enbrel, MTX 4 tablets, folic acid 1 mg daily.  Her labs have been stable.  We will continue to monitor labs every 3 months.  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  Age-related osteoporosis without current pathological fracture - T score -3.6 in January 2019.  Fosamax weekly.  We will repeat DEXA in 2021.  Renal calcinosis  History of anemia  Other psoriasis-she has few scattered hyperpigmented lesions.  Vitamin D deficiency -she is on supplement.   Orders: No orders of the defined types were placed in this encounter.  Meds ordered this encounter  Medications  . etanercept (ENBREL) 50 MG/ML injection    Sig: INJECT 1 SYRINGE UNDER THE SKIN EVERY 7 DAYS.    Dispense:  12 Syringe    Refill:  0      Follow-Up Instructions: Return in about 5 months (around 11/08/2018) for Psoriatic arthritis, Osteoporosis.   Bo Merino, MD  Note - This record has been created using Editor, commissioning.  Chart creation errors have been sought, but may not always  have been located. Such creation errors do not reflect on  the standard of medical care.

## 2018-06-08 ENCOUNTER — Ambulatory Visit: Payer: BC Managed Care – PPO | Admitting: Rheumatology

## 2018-06-08 ENCOUNTER — Encounter: Payer: Self-pay | Admitting: Physician Assistant

## 2018-06-08 VITALS — BP 125/80 | HR 75 | Resp 14 | Ht 64.0 in | Wt 125.0 lb

## 2018-06-08 DIAGNOSIS — E559 Vitamin D deficiency, unspecified: Secondary | ICD-10-CM | POA: Diagnosis not present

## 2018-06-08 DIAGNOSIS — M24521 Contracture, right elbow: Secondary | ICD-10-CM

## 2018-06-08 DIAGNOSIS — Z862 Personal history of diseases of the blood and blood-forming organs and certain disorders involving the immune mechanism: Secondary | ICD-10-CM | POA: Diagnosis not present

## 2018-06-08 DIAGNOSIS — L408 Other psoriasis: Secondary | ICD-10-CM | POA: Diagnosis not present

## 2018-06-08 DIAGNOSIS — M810 Age-related osteoporosis without current pathological fracture: Secondary | ICD-10-CM | POA: Diagnosis not present

## 2018-06-08 DIAGNOSIS — Z79899 Other long term (current) drug therapy: Secondary | ICD-10-CM | POA: Diagnosis not present

## 2018-06-08 DIAGNOSIS — L4050 Arthropathic psoriasis, unspecified: Secondary | ICD-10-CM | POA: Diagnosis not present

## 2018-06-08 DIAGNOSIS — N29 Other disorders of kidney and ureter in diseases classified elsewhere: Secondary | ICD-10-CM

## 2018-06-08 MED ORDER — ETANERCEPT 50 MG/ML ~~LOC~~ SOSY: PREFILLED_SYRINGE | SUBCUTANEOUS | 0 refills | Status: DC

## 2018-06-08 NOTE — Patient Instructions (Signed)
Standing Labs We placed an order today for your standing lab work.    Please come back and get your standing labs in September and every 3 months   We have open lab Monday through Friday from 8:30-11:30 AM and 1:30-4:00 PM  at the office of Dr. Ryosuke Ericksen.   You may experience shorter wait times on Monday and Friday afternoons. The office is located at [address], [address], [place], [place] [address] No appointment is necessary.   Labs are drawn by [facility].  You may receive a bill from [facility] for your lab work. If you have any questions regarding directions or hours of operation,  please call [phone].    

## 2018-06-22 ENCOUNTER — Ambulatory Visit: Payer: BC Managed Care – PPO | Admitting: Physician Assistant

## 2018-07-03 ENCOUNTER — Other Ambulatory Visit: Payer: Self-pay | Admitting: Rheumatology

## 2018-07-03 DIAGNOSIS — Z79899 Other long term (current) drug therapy: Secondary | ICD-10-CM

## 2018-07-04 NOTE — Telephone Encounter (Signed)
Last visit: 06/08/2018 Next visit: 11/08/2018 Labs: 05/06/2018 stable.   Okay to refill per Dr. Estanislado Pandy.

## 2018-07-08 ENCOUNTER — Other Ambulatory Visit: Payer: Self-pay | Admitting: Rheumatology

## 2018-07-08 NOTE — Telephone Encounter (Signed)
Last visit: 06/08/2018 Next visit: 11/08/2018 Labs: 05/06/2018 stable   Okay to refill per Dr. Estanislado Pandy.

## 2018-07-20 ENCOUNTER — Other Ambulatory Visit: Payer: Self-pay

## 2018-07-20 ENCOUNTER — Encounter (HOSPITAL_COMMUNITY): Payer: Self-pay

## 2018-07-20 ENCOUNTER — Ambulatory Visit (HOSPITAL_COMMUNITY)
Admission: EM | Admit: 2018-07-20 | Discharge: 2018-07-20 | Disposition: A | Discharge status: Home / Self Care | Payer: BC Managed Care – PPO | Attending: Family Medicine | Admitting: Family Medicine

## 2018-07-20 DIAGNOSIS — L089 Local infection of the skin and subcutaneous tissue, unspecified: Secondary | ICD-10-CM | POA: Diagnosis not present

## 2018-07-20 MED ORDER — MUPIROCIN 2 % EX OINT: 1.0000 "application " | TOPICAL_OINTMENT | Freq: Two times a day (BID) | CUTANEOUS | 0 refills | Status: DC

## 2018-07-20 NOTE — Discharge Instructions (Addendum)
Apply the antibiotic ointment over the opened pustule on your left leg. If not seeing steady improvement over the next few days you may return here for re-evaluation.

## 2018-07-20 NOTE — ED Provider Notes (Signed)
[place]   [id number] 07/20/18 Arrival Time: 1501  ASSESSMENT & PLAN:  1. Skin pustule     Meds ordered this encounter  Medications  . mupirocin ointment (BACTROBAN) 2 %    Sig: Place 1 application into the nose 2 (two) times daily.    Dispense:  22 g    Refill:  0   Observe. Question ant/insect bite. Will follow up with PCP or here if worsening or failing to improve as anticipated. Reviewed expectations re: course of current medical issues. Questions answered. Outlined signs and symptoms indicating need for more acute intervention. Patient verbalized understanding. After Visit Summary given.   SUBJECTIVE:  Taylor Kirby is a 62 y.o. female who presents with a skin complaint. Noticed an hour ago. No pain or itching. "A small bump." Back of her L lower leg. No drainage. Has been working outside. Ambulatory without problem. No change in area since noticing. No OTC/home treatment. No specific aggravating or alleviating factors reported.   ROS: As per HPI.  OBJECTIVE: Vitals:   07/20/18 1537 07/20/18 1538  BP: (!) 144/91   Pulse: 76   Resp: 18   Temp: 98.3 F (36.8 C)   SpO2: 100%   Weight:  58.1 kg    General appearance: alert; no distress Lungs: clear to auscultation bilaterally Heart: regular rate and rhythm Extremities: no edema Skin: warm and dry; skin pustule on back of L lower leg; non-tender; no drainage; normal skin temperature; no foreign body seen Psychological: alert and cooperative; normal mood and affect  Allergies  Allergen Reactions  . Penicillins   . Sulfa Antibiotics     Past Medical History:  Diagnosis Date  . Allergy   . Anemia   . Heart murmur   . Osteoporosis   . Rheumatoid arthritis ([place])    Social History   Socioeconomic History  . Marital status: Married    Spouse name: Not on file  . Number of children: Not on file  . Years of education: Not on file  . Highest education level: Not on file  Occupational  History  . Not on file  Social Needs  . Financial resource strain: Not on file  . Food insecurity:    Worry: Not on file    Inability: Not on file  . Transportation needs:    Medical: Not on file    Non-medical: Not on file  Tobacco Use  . Smoking status: Never Smoker  . Smokeless tobacco: Never Used  Substance and Sexual Activity  . Alcohol use: Yes    Comment: Socially   . Drug use: No  . Sexual activity: Not on file  Lifestyle  . Physical activity:    Days per week: Not on file    Minutes per session: Not on file  . Stress: Not on file  Relationships  . Social connections:    Talks on phone: Not on file    Gets together: Not on file    Attends religious service: Not on file    Active member of club or organization: Not on file    Attends meetings of clubs or organizations: Not on file    Relationship status: Not on file  . Intimate partner violence:    Fear of current or ex partner: Not on file    Emotionally abused: Not on file    Physically abused: Not on file    Forced sexual activity: Not on file  Other Topics Concern  . Not on file  Social History Narrative  . Not on file   Family History  Problem Relation Age of Onset  . Hyperlipidemia Mother   . Hypertension Mother   . Stroke Mother   . Hyperlipidemia Father   . Cancer Father   . Hypertension Sister   . Heart disease Maternal Grandmother   . Cancer Maternal Grandfather   . Hypertension Paternal Grandmother    Past Surgical History:  Procedure Laterality Date  . TUBAL LIGATION       Vanessa Kick, MD 07/26/18 [phone]

## 2018-07-20 NOTE — ED Triage Notes (Signed)
Leg pain and blister on the back of her left leg. This happened today just about an hour ago.

## 2018-09-02 ENCOUNTER — Other Ambulatory Visit: Payer: Self-pay | Admitting: *Deleted

## 2018-09-02 DIAGNOSIS — Z79899 Other long term (current) drug therapy: Secondary | ICD-10-CM

## 2018-09-02 LAB — COMPLETE METABOLIC PANEL WITH GFR
AG Ratio: 1.1 (calc) (ref 1.0–2.5)
ALT: 10 U/L (ref 6–29)
AST: 19 U/L (ref 10–35)
Albumin: 4 g/dL (ref 3.6–5.1)
Alkaline phosphatase (APISO): 79 U/L (ref 33–130)
BUN: 19 mg/dL (ref 7–25)
CALCIUM: 9.8 mg/dL (ref 8.6–10.4)
CO2: 30 mmol/L (ref 20–32)
CREATININE: 0.87 mg/dL (ref 0.50–0.99)
Chloride: 102 mmol/L (ref 98–110)
GFR, EST NON AFRICAN AMERICAN: 72 mL/min/{1.73_m2} (ref >=60)
GFR, Est African American: 84 mL/min/{1.73_m2} (ref >=60)
GLOBULIN: 3.6 g/dL (ref 1.9–3.7)
Glucose, Bld: 131 mg/dL — ABNORMAL HIGH (ref 65–99)
Potassium: 5.5 mmol/L — ABNORMAL HIGH (ref 3.5–5.3)
Sodium: 139 mmol/L (ref 135–146)
Total Bilirubin: 0.5 mg/dL (ref 0.2–1.2)
Total Protein: 7.6 g/dL (ref 6.1–8.1)

## 2018-09-02 LAB — CBC WITH DIFFERENTIAL/PLATELET
BASOS PCT: 1 %
Basophils Absolute: 40 cells/uL (ref 0–200)
EOS ABS: 128 {cells}/uL (ref 15–500)
Eosinophils Relative: 3.2 %
HCT: 40 % (ref 35.0–45.0)
HEMOGLOBIN: 12.3 g/dL (ref 11.7–15.5)
LYMPHS ABS: 1620 {cells}/uL (ref 850–3900)
MCH: 24.8 pg — AB (ref 27.0–33.0)
MCHC: 30.8 g/dL — ABNORMAL LOW (ref 32.0–36.0)
MCV: 80.6 fL (ref 80.0–100.0)
MPV: 11.5 fL (ref 7.5–12.5)
Monocytes Relative: 6.5 %
NEUTROS ABS: 1952 {cells}/uL (ref 1500–7800)
Neutrophils Relative %: 48.8 %
Platelets: 293 10*3/uL (ref 140–400)
RBC: 4.96 10*6/uL (ref 3.80–5.10)
RDW: 15.2 % — ABNORMAL HIGH (ref 11.0–15.0)
Total Lymphocyte: 40.5 %
WBC: 4 10*3/uL (ref 3.8–10.8)
WBCMIX: 260 {cells}/uL (ref 200–950)

## 2018-09-05 NOTE — Progress Notes (Signed)
Potassium is high.  Most likely hemolyzed.  She is not taking any potassium supplement.  Please forward labs to her PCP.

## 2018-09-09 ENCOUNTER — Other Ambulatory Visit: Payer: Self-pay | Admitting: Rheumatology

## 2018-09-09 NOTE — Telephone Encounter (Addendum)
Last visit: 06/08/2018 Next visit: 11/08/2018 Labs: 09/02/18 elevated potassium  TB gold: 06/25/17 Neg   Left message to advise patient she is due to update TB Gold.  Okay to refill 30 day supply per Dr. Estanislado Pandy

## 2018-10-03 ENCOUNTER — Other Ambulatory Visit: Payer: Self-pay | Admitting: Rheumatology

## 2018-10-03 DIAGNOSIS — Z79899 Other long term (current) drug therapy: Secondary | ICD-10-CM

## 2018-10-03 NOTE — Telephone Encounter (Signed)
Last visit: 06/08/2018 Next visit: 11/08/2018 Labs: 09/02/18 Potassium is high. Most likely hemolyzed.  Okay to refill per Dr. Estanislado Pandy

## 2018-10-11 ENCOUNTER — Other Ambulatory Visit: Payer: Self-pay | Admitting: Rheumatology

## 2018-10-11 DIAGNOSIS — Z9225 Personal history of immunosupression therapy: Secondary | ICD-10-CM

## 2018-10-11 NOTE — Telephone Encounter (Signed)
ok 

## 2018-10-11 NOTE — Telephone Encounter (Addendum)
Last visit: 06/08/2018 Next visit: 11/08/2018 Labs: 09/02/18 elevated potassium  TB gold: 06/25/17 Neg   Patient advised she is due to update TB Gold. Patient will update 10/13/18.  Okay to refill 30 day supply Enbrel?

## 2018-10-13 ENCOUNTER — Other Ambulatory Visit: Payer: Self-pay

## 2018-10-13 DIAGNOSIS — Z9225 Personal history of immunosupression therapy: Secondary | ICD-10-CM

## 2018-10-15 LAB — QUANTIFERON-TB GOLD PLUS
NIL: 0.03 IU/mL
QuantiFERON-TB Gold Plus: NEGATIVE
TB1-NIL: 0 [IU]/mL
TB2-NIL: 0 IU/mL

## 2018-10-21 ENCOUNTER — Other Ambulatory Visit: Payer: Self-pay | Admitting: Rheumatology

## 2018-10-24 NOTE — Telephone Encounter (Signed)
Last Visit: 06/08/2018 Next Visit: 11/08/2018 Labs: 09/02/2018 Potassium is high. Most likely hemolyzed. She is not taking any potassium supplement.   Okay to refill per Dr. Estanislado Pandy.

## 2018-10-25 NOTE — Progress Notes (Signed)
Office Visit Note  Patient: Taylor Kirby             Date of Birth: 06/17/1956           MRN: [id number]             PCP: Taylor Stains, MD Referring: Taylor Stains, MD Visit Date: 11/08/2018 Occupation: @GUAROCC @  Subjective:  Medication management  History of Present Illness: Taylor Kirby is a 62 y.o. female with history of psoriatic arthritis and osteoporosis.  Patient she does not have any joint pain or joint swelling.  She has been tolerating Enbrel and methotrexate well.  She is also taking Fosamax for osteoporosis.  She has not been taking calcium and vitamin D.  Activities of Daily Living:  Patient reports morning stiffness for 0 minute.   Patient Denies nocturnal pain.  Difficulty dressing/grooming: Denies Difficulty climbing stairs: Denies Difficulty getting out of chair: Denies Difficulty using hands for taps, buttons, cutlery, and/or writing: Denies  Review of Systems  Constitutional: Negative for fatigue, night sweats, weight gain and weight loss.  HENT: Negative for mouth sores, trouble swallowing, trouble swallowing, mouth dryness and nose dryness.   Eyes: Negative for pain, redness, visual disturbance and dryness.  Respiratory: Negative for cough, shortness of breath and difficulty breathing.   Cardiovascular: Negative for chest pain, palpitations, hypertension, irregular heartbeat and swelling in legs/feet.  Gastrointestinal: Negative for blood in stool, constipation and diarrhea.  Endocrine: Negative for increased urination.  Genitourinary: Negative for vaginal dryness.  Musculoskeletal: Negative for arthralgias, joint pain, joint swelling, myalgias, muscle weakness, morning stiffness, muscle tenderness and myalgias.  Skin: Positive for rash. Negative for color change, hair loss, skin tightness, ulcers and sensitivity to sunlight.  Allergic/Immunologic: Negative for susceptible to infections.  Neurological: Negative for dizziness, memory loss, night  sweats and weakness.  Hematological: Negative for swollen glands.  Psychiatric/Behavioral: Negative for depressed mood and sleep disturbance. The patient is nervous/anxious.     PMFS History:  Patient Active Problem List   Diagnosis Date Noted  . History of anemia 03/10/2017  . 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 Anemia 10/05/2016  . Osteopenia 10/05/2016  . 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 High risk medication use 10/03/2016  . Psoriatic arthritis ([place]) 10/25/2015    Past Medical History:  Diagnosis Date  . Allergy   . Anemia   . Heart murmur   . Osteoporosis   . Rheumatoid arthritis ([place])     Family History  Problem Relation Age of Onset  . Hyperlipidemia Mother   . Hypertension Mother   . Stroke Mother   . Hyperlipidemia Father   . Cancer Father   . Hypertension Sister   . Heart disease Maternal Grandmother   . Cancer Maternal Grandfather   . Hypertension Paternal Grandmother    Past Surgical History:  Procedure Laterality Date  . TUBAL LIGATION     Social History   Social History Narrative  . Not on file    Objective: Vital Signs: BP 134/89 (BP Location: Left Arm, Patient Position: Sitting, Cuff Size: Normal)   Pulse 78   Resp 12   Ht 5\' 5"  (1.651 m)   Wt 128 lb (58.1 kg)   BMI 21.30 kg/m    Physical Exam  Constitutional: She is oriented to person, place, and time. She appears well-developed and well-nourished.  HENT:  Head: Normocephalic and atraumatic.  Eyes: Conjunctivae and EOM are normal.  Neck: Normal range of motion.  Cardiovascular: Normal rate, regular  rhythm, normal heart sounds and intact distal pulses.  Pulmonary/Chest: Effort normal and breath sounds normal.  Abdominal: Soft. Bowel sounds are normal.  Lymphadenopathy:    She has no cervical adenopathy.  Neurological: She is alert and oriented to person, place, and time.  Skin: Skin is warm and dry. Capillary refill takes less than 2 seconds.  Few dry scales noted on left  lower extremity.  Psychiatric: She has a normal mood and affect. Her behavior is normal.  Nursing note and vitals reviewed.    Musculoskeletal Exam: C-spine thoracic lumbar spine good range of motion.  Shoulder joints were in good range of motion.  She has contracture in right elbow.  She has good range of motion of her wrist joints.  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  She also has contractures in her PIPs and DIPs of her right hand.  Hip joints, knee joints, ankles were in good range of motion with no synovitis.  CDAI Exam: CDAI Score: 0.2  Patient Global Assessment: 1 (mm); Provider Global Assessment: 1 (mm) Swollen: 0 ; Tender: 0  Joint Exam   Not documented   There is currently no information documented on the homunculus. Go to the Rheumatology activity and complete the homunculus joint exam.  Investigation: No additional findings.  Imaging: No results found.  Recent Labs: Lab Results  Component Value Date   WBC 4.0 09/02/2018   HGB 12.3 09/02/2018   PLT 293 09/02/2018   NA 139 09/02/2018   K 5.5 (H) 09/02/2018   CL 102 09/02/2018   CO2 30 09/02/2018   GLUCOSE 131 (H) 09/02/2018   BUN 19 09/02/2018   CREATININE 0.87 09/02/2018   BILITOT 0.5 09/02/2018   ALKPHOS 95 02/04/2018   AST 19 09/02/2018   ALT 10 09/02/2018   PROT 7.6 09/02/2018   ALBUMIN 3.7 02/04/2018   CALCIUM 9.8 09/02/2018   GFRAA 84 09/02/2018   QFTBGOLDPLUS NEGATIVE 10/13/2018    Speciality Comments: No specialty comments available.  Procedures:  No procedures performed Allergies: Penicillins and Sulfa antibiotics   Assessment / Plan:     Visit Diagnoses: Psoriatic arthritis (HCC)-her arthritis appears to be very well controlled on Enbrel and methotrexate combination.  She has no active synovitis on examination.  She has multiple contractures and arthritis mutilans which is been an issue.  Other psoriasis-she had few scaly lesions on her left lower extremity  which could be dry skin or psoriasis.  I have advised her to use clobetasol.  She has prescription at home.  High risk medication use -Current regimen includes Enbrel 50 mg weekly, methotrexate 4 tablets weekly, folic acid 1 mg daily.  Last TB gold negative on 10/13/2018.  Most recent CBC/CMP within normal limits except high potassium on 09/02/2018.  Next CBC/CMP due in January and then every 3 months.  Standing orders are in place. Recommend flu, Pneumovax 23, Prevnar 13, and Shingrix as indicated.   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  Age-related osteoporosis without current pathological fracture -Currently on Fosamax 70 mg weekly started in March 2019.  DEXA on 12/20/2017 T score at lumbar spine -3.6.  We will repeat DEXA in 2021.  We had detailed discussion regarding regular exercise muscle strengthening and use of calcium and vitamin D from natural sources.  Renal calcinosis-she is uncertain what form of kidney stone she had.  Vitamin D deficiency -her vitamin D was low normal.  Have advised her to take natural sources for calcium and  vitamin D.  Orders: Orders Placed This Encounter  Procedures  . VITAMIN D 25 Hydroxy (Vit-D Deficiency, Fractures)   No orders of the defined types were placed in this encounter.   Association of heart disease with psoriatic arthritis was discussed. Need to monitor blood pressure, cholesterol, and to exercise 30-60 minutes on daily basis was discussed.   Follow-Up Instructions: Return in about 5 months (around 04/09/2019) for Psoriatic arthritis, Osteoporosis.   Bo Merino, MD  Note - This record has been created using Editor, commissioning.  Chart creation errors have been sought, but may not always  have been located. Such creation errors do not reflect on  the standard of medical care.

## 2018-10-26 ENCOUNTER — Other Ambulatory Visit: Payer: Self-pay | Admitting: Rheumatology

## 2018-10-26 DIAGNOSIS — Z9225 Personal history of immunosupression therapy: Secondary | ICD-10-CM

## 2018-10-26 NOTE — Telephone Encounter (Signed)
Last Visit: 06/08/2018 Next Visit: 11/08/2018 Labs: 09/02/2018 Potassium is high. Most likely hemolyzed. She is not taking any potassium supplement.  TB Gold: 10/13/18 Neg   Okay to refill per Dr. Estanislado Pandy.

## 2018-10-27 ENCOUNTER — Other Ambulatory Visit: Payer: Self-pay | Admitting: Rheumatology

## 2018-10-27 DIAGNOSIS — Z9225 Personal history of immunosupression therapy: Secondary | ICD-10-CM

## 2018-11-08 ENCOUNTER — Encounter: Payer: Self-pay | Admitting: Rheumatology

## 2018-11-08 ENCOUNTER — Ambulatory Visit: Payer: BC Managed Care – PPO | Admitting: Rheumatology

## 2018-11-08 VITALS — BP 134/89 | HR 78 | Resp 12 | Ht 65.0 in | Wt 128.0 lb

## 2018-11-08 DIAGNOSIS — L408 Other psoriasis: Secondary | ICD-10-CM

## 2018-11-08 DIAGNOSIS — M24521 Contracture, right elbow: Secondary | ICD-10-CM | POA: Diagnosis not present

## 2018-11-08 DIAGNOSIS — M810 Age-related osteoporosis without current pathological fracture: Secondary | ICD-10-CM

## 2018-11-08 DIAGNOSIS — L4050 Arthropathic psoriasis, unspecified: Secondary | ICD-10-CM

## 2018-11-08 DIAGNOSIS — E559 Vitamin D deficiency, unspecified: Secondary | ICD-10-CM

## 2018-11-08 DIAGNOSIS — Z79899 Other long term (current) drug therapy: Secondary | ICD-10-CM | POA: Diagnosis not present

## 2018-11-08 DIAGNOSIS — N29 Other disorders of kidney and ureter in diseases classified elsewhere: Secondary | ICD-10-CM

## 2018-11-08 NOTE — Patient Instructions (Signed)
Standing Labs We placed an order today for your standing lab work.    Please come back and get your standing labs in January and every 3 months MND with next lab  We have open lab Monday through Friday from 8:30-11:30 AM and 1:30-4:00 PM  at the office of Dr. Bo Merino.   You may experience shorter wait times on Monday and Friday afternoons. The office is located at [address], [place], [place], [place] [address] No appointment is necessary.   Labs are drawn by [facility].  You may receive a bill from [facility] for your lab work. If you have any questions regarding directions or hours of operation,  please call [phone].   Just as a reminder please drink plenty of water prior to coming for your lab work. Thanks!

## 2018-11-22 ENCOUNTER — Other Ambulatory Visit: Payer: Self-pay | Admitting: Obstetrics and Gynecology

## 2018-11-22 ENCOUNTER — Other Ambulatory Visit (HOSPITAL_COMMUNITY)
Admission: RE | Admit: 2018-11-22 | Discharge: 2018-11-22 | Disposition: A | Discharge status: Home / Self Care | Payer: BC Managed Care – PPO | Source: Ambulatory Visit | Attending: Obstetrics and Gynecology | Admitting: Obstetrics and Gynecology

## 2018-11-22 DIAGNOSIS — Z01419 Encounter for gynecological examination (general) (routine) without abnormal findings: Secondary | ICD-10-CM | POA: Diagnosis present

## 2018-11-29 LAB — CYTOLOGY - PAP
Diagnosis: NEGATIVE
Diagnosis: REACTIVE
HPV: NOT DETECTED

## 2019-01-03 ENCOUNTER — Other Ambulatory Visit: Payer: Self-pay | Admitting: Rheumatology

## 2019-01-03 DIAGNOSIS — Z79899 Other long term (current) drug therapy: Secondary | ICD-10-CM

## 2019-01-03 NOTE — Telephone Encounter (Signed)
Last Visit: 11/08/18 Next visit: 04/10/19 Labs: 09/02/18 Potassium is high.  Left message to advise patient she is due for labs.   Okay to refill 30 day supply per Dr. Estanislado Pandy

## 2019-01-15 ENCOUNTER — Other Ambulatory Visit: Payer: Self-pay | Admitting: Rheumatology

## 2019-01-16 NOTE — Telephone Encounter (Signed)
Last Visit: 11/08/18 Next visit: 04/10/19 Labs: 09/02/18 Potassium is high  Okay to refill per Dr. Estanislado Pandy

## 2019-01-17 ENCOUNTER — Other Ambulatory Visit: Payer: Self-pay

## 2019-01-17 DIAGNOSIS — Z79899 Other long term (current) drug therapy: Secondary | ICD-10-CM

## 2019-01-17 DIAGNOSIS — E559 Vitamin D deficiency, unspecified: Secondary | ICD-10-CM

## 2019-01-18 LAB — COMPLETE METABOLIC PANEL WITH GFR
AG Ratio: 1.1 (calc) (ref 1.0–2.5)
ALT: 9 U/L (ref 6–29)
AST: 17 U/L (ref 10–35)
Albumin: 4 g/dL (ref 3.6–5.1)
Alkaline phosphatase (APISO): 72 U/L (ref 37–153)
BUN: 13 mg/dL (ref 7–25)
CALCIUM: 10.1 mg/dL (ref 8.6–10.4)
CO2: 32 mmol/L (ref 20–32)
CREATININE: 0.8 mg/dL (ref 0.50–0.99)
Chloride: 101 mmol/L (ref 98–110)
GFR, EST NON AFRICAN AMERICAN: 80 mL/min/{1.73_m2} (ref >=60)
GFR, Est African American: 92 mL/min/{1.73_m2} (ref >=60)
GLUCOSE: 85 mg/dL (ref 65–99)
Globulin: 3.6 g/dL (calc) (ref 1.9–3.7)
Potassium: 4.6 mmol/L (ref 3.5–5.3)
Sodium: 138 mmol/L (ref 135–146)
Total Bilirubin: 0.5 mg/dL (ref 0.2–1.2)
Total Protein: 7.6 g/dL (ref 6.1–8.1)

## 2019-01-18 LAB — CBC WITH DIFFERENTIAL/PLATELET
ABSOLUTE MONOCYTES: 243 {cells}/uL (ref 200–950)
Basophils Absolute: 42 cells/uL (ref 0–200)
Basophils Relative: 1.1 %
EOS PCT: 2.7 %
Eosinophils Absolute: 103 cells/uL (ref 15–500)
HEMATOCRIT: 40.2 % (ref 35.0–45.0)
HEMOGLOBIN: 12.4 g/dL (ref 11.7–15.5)
Lymphs Abs: 1307 cells/uL (ref 850–3900)
MCH: 25 pg — ABNORMAL LOW (ref 27.0–33.0)
MCHC: 30.8 g/dL — ABNORMAL LOW (ref 32.0–36.0)
MCV: 81 fL (ref 80.0–100.0)
MPV: 11.9 fL (ref 7.5–12.5)
Monocytes Relative: 6.4 %
NEUTROS ABS: 2105 {cells}/uL (ref 1500–7800)
Neutrophils Relative %: 55.4 %
Platelets: 365 10*3/uL (ref 140–400)
RBC: 4.96 10*6/uL (ref 3.80–5.10)
RDW: 14.7 % (ref 11.0–15.0)
Total Lymphocyte: 34.4 %
WBC: 3.8 10*3/uL (ref 3.8–10.8)

## 2019-01-18 LAB — VITAMIN D 25 HYDROXY (VIT D DEFICIENCY, FRACTURES): VIT D 25 HYDROXY: 41 ng/mL (ref 30–100)

## 2019-01-23 ENCOUNTER — Other Ambulatory Visit: Payer: Self-pay | Admitting: Rheumatology

## 2019-01-23 DIAGNOSIS — Z9225 Personal history of immunosupression therapy: Secondary | ICD-10-CM

## 2019-01-23 NOTE — Telephone Encounter (Signed)
Last Visit: 11/08/18 Next visit: 04/10/19 Labs: 01/17/19 CMP WNL. CBC stable.  Okay to refill per Dr. Estanislado Pandy

## 2019-01-25 ENCOUNTER — Other Ambulatory Visit: Payer: Self-pay | Admitting: Rheumatology

## 2019-01-25 DIAGNOSIS — Z79899 Other long term (current) drug therapy: Secondary | ICD-10-CM

## 2019-01-25 NOTE — Telephone Encounter (Signed)
Last Visit: 11/08/18 Next visit: 04/10/19 Labs: 01/17/19 CMP WNL. CBC stable.  Okay to refill per Dr. Estanislado Pandy

## 2019-03-31 NOTE — Progress Notes (Signed)
Virtual Visit via Telephone Note  I connected with Taylor Kirby on 03/31/19 at 10:45 AM EDT by telephone and verified that I am speaking with the correct person using two identifiers.  Location: Patient: Home Provider: Clinic This service was conducted via virtual visit. She was unable to use webex, so we reached her by telephone. The patient was located at home. I was located in my office.  Consent was obtained prior to the virtual visit and is aware of possible charges through their insurance for this visit.  The patient is an established patient.  Dr. Estanislado Pandy, MD conducted the virtual visit and [name], PA-C acted as scribe during the service.  Office staff helped with scheduling follow up visits after the service was conducted.    I discussed the limitations, risks, security and privacy concerns of performing an evaluation and management service by telephone and the availability of in person appointments. I also discussed with the patient that there may be a patient responsible charge related to this service. The patient expressed understanding and agreed to proceed.  CC: Medication monitoring   History of Present Illness: Patient is a 63 year old female with a past medical history of psoriatic arthritis and osteoporosis.  She is on Enbrel 50 mg sq injections once weekly, MTX 4 tablets po once weekly, and folic acid 1 mg po daily.  She has not had any recent flares.  She has no joint pain or joint swelling.  She has no morning stiffness.  She has no achilles tendonitis or plantar fasciitis.  She has no SI joint pain.  Her psoriasis has been well controlled.   Review of Systems  Constitutional: Negative for fever and malaise/fatigue.  Eyes: Negative for photophobia, pain, discharge and redness.       +Dry eyes  Respiratory: Negative for cough, shortness of breath and wheezing.   Cardiovascular: Negative for chest pain and palpitations.  Gastrointestinal: Negative for blood in  stool, constipation and diarrhea.  Genitourinary: Negative for dysuria.  Musculoskeletal: Negative for back pain, joint pain, myalgias and neck pain.  Skin: Negative for rash.  Neurological: Negative for dizziness and headaches.  Psychiatric/Behavioral: Negative for depression. The patient is not nervous/anxious and does not have insomnia.      Observations/Objective: Physical Exam  Constitutional: She is oriented to person, place, and time.  Neurological: She is alert and oriented to person, place, and time.  Psychiatric: Mood, memory, affect and judgment normal.   Patient reports morning stiffness for 0 minutes.   Patient denies nocturnal pain.  Difficulty dressing/grooming: Denies Difficulty climbing stairs: Denies Difficulty getting out of chair: Denies Difficulty using hands for taps, buttons, cutlery, and/or writing: Denies  Assessment and Plan: Psoriatic arthritis (HCC)-She has not had any recent psoriatic flares.  She has no joint pain or joint swelling.  She has no morning stiffness.  She has no achilles tendonitis or plantar fasciitis.  She has no SI joint pain.  Her psoriasis has been well controlled.  She is clinically doing well on Enbrel 50 mg sq once weekly, MTX 4 tablets po once weekly, and folic acid 1 mg po daily.  She will continue on this current treatment regimen.  She does not need any refills at this time.  She was advised to notify us if she develops increased joint pain or joint swelling.  She will follow up in 3 months.   Other psoriasis-Well-controlled.  She has a few scattered patches.  She will continue on MTX 4  tablets po once weekly and Enbrel 50 mg sq once weekly.   High risk medication use -Current regimen includes Enbrel 50 mg weekly, methotrexate 4 tablets weekly, and folic acid 1 mg daily.  Last TB gold negative on 10/13/2018. CBC and CMP were drawn on 01/17/19.  She is due for lab work in May and every 3 months to monitor for drug toxicity. Standing  orders are in place.  She was advised to hold Enbrel and MTX if she develops and infection and to resume once the infection has cleared.  The importance of social distancing and following standard precautions recommended by the CDC were discussed.    Contracture of elbow joint, right-Chronic.  She has no pain or joint swelling.   Age-related osteoporosis without current pathological fracture -Currently on Fosamax 70 mg weekly started in March 2019.  DEXA on 12/20/2017 T score at lumbar spine -3.6.  We will repeat DEXA in 2021. She is receiving calcium and vitamin D from her diet.  Vitamin D deficiency -She could not tolerate taking a vitamin D supplement, so she drinks milk with vitamin D in it.  Follow Up Instructions: She will follow up in 3 months.  Standing orders are in place She is due to update labs this month.    I discussed the assessment and treatment plan with the patient. The patient was provided an opportunity to ask questions and all were answered. The patient agreed with the plan and demonstrated an understanding of the instructions.   The patient was advised to call back or seek an in-person evaluation if the symptoms worsen or if the condition fails to improve as anticipated.  I provided 20 minutes of non-face-to-face time during this encounter. Bo Merino, MD   Scribed by-  Ofilia Neas, PA-C

## 2019-04-05 ENCOUNTER — Telehealth (INDEPENDENT_AMBULATORY_CARE_PROVIDER_SITE_OTHER): Payer: BC Managed Care – PPO | Admitting: Rheumatology

## 2019-04-05 ENCOUNTER — Encounter: Payer: Self-pay | Admitting: Rheumatology

## 2019-04-05 DIAGNOSIS — L4050 Arthropathic psoriasis, unspecified: Secondary | ICD-10-CM | POA: Diagnosis not present

## 2019-04-05 DIAGNOSIS — N29 Other disorders of kidney and ureter in diseases classified elsewhere: Secondary | ICD-10-CM

## 2019-04-05 DIAGNOSIS — Z862 Personal history of diseases of the blood and blood-forming organs and certain disorders involving the immune mechanism: Secondary | ICD-10-CM

## 2019-04-05 DIAGNOSIS — Z79899 Other long term (current) drug therapy: Secondary | ICD-10-CM | POA: Diagnosis not present

## 2019-04-05 DIAGNOSIS — M24521 Contracture, right elbow: Secondary | ICD-10-CM | POA: Diagnosis not present

## 2019-04-05 DIAGNOSIS — L408 Other psoriasis: Secondary | ICD-10-CM | POA: Diagnosis not present

## 2019-04-05 DIAGNOSIS — M810 Age-related osteoporosis without current pathological fracture: Secondary | ICD-10-CM

## 2019-04-05 DIAGNOSIS — E559 Vitamin D deficiency, unspecified: Secondary | ICD-10-CM

## 2019-04-10 ENCOUNTER — Ambulatory Visit: Payer: Self-pay | Admitting: Rheumatology

## 2019-04-11 ENCOUNTER — Other Ambulatory Visit: Payer: Self-pay | Admitting: Rheumatology

## 2019-04-11 NOTE — Telephone Encounter (Signed)
Last Visit: 04/05/19 Next Visit due August 2020. Message sent to the front to schedule patient. Labs: 01/17/19 stable  Okay to refill per Dr. Estanislado Pandy

## 2019-04-11 NOTE — Telephone Encounter (Signed)
Please schedule patient for a follow up visit. Patient due August 2020. Thanks!

## 2019-04-22 ENCOUNTER — Other Ambulatory Visit: Payer: Self-pay | Admitting: Rheumatology

## 2019-04-22 DIAGNOSIS — Z79899 Other long term (current) drug therapy: Secondary | ICD-10-CM

## 2019-04-25 NOTE — Telephone Encounter (Signed)
Last Visit: 04/05/19 Next Visit: 07/10/19 Labs: 01/17/19 CMP WNL, CBC stable  Patient advised she is due to update labs.Patient will update this week.   Okay to refill 30 day supply per Dr. Estanislado Pandy

## 2019-04-26 ENCOUNTER — Other Ambulatory Visit: Payer: Self-pay | Admitting: Rheumatology

## 2019-04-26 NOTE — Telephone Encounter (Signed)
Last Visit: 04/05/19 Next Visit: 07/10/19  Okay to refill per Dr. Estanislado Pandy

## 2019-05-09 ENCOUNTER — Other Ambulatory Visit: Payer: Self-pay

## 2019-05-09 DIAGNOSIS — Z79899 Other long term (current) drug therapy: Secondary | ICD-10-CM

## 2019-05-10 LAB — COMPLETE METABOLIC PANEL WITH GFR
AG Ratio: 1.2 (calc) (ref 1.0–2.5)
ALT: 11 U/L (ref 6–29)
AST: 17 U/L (ref 10–35)
Albumin: 3.7 g/dL (ref 3.6–5.1)
Alkaline phosphatase (APISO): 74 U/L (ref 37–153)
BUN: 19 mg/dL (ref 7–25)
CO2: 29 mmol/L (ref 20–32)
Calcium: 9.4 mg/dL (ref 8.6–10.4)
Chloride: 104 mmol/L (ref 98–110)
Creat: 0.81 mg/dL (ref 0.50–0.99)
GFR, Est African American: 91 mL/min/{1.73_m2} (ref >=60)
GFR, Est Non African American: 78 mL/min/{1.73_m2} (ref >=60)
Globulin: 3.2 g/dL (calc) (ref 1.9–3.7)
Glucose, Bld: 58 mg/dL — ABNORMAL LOW (ref 65–99)
Potassium: 4.9 mmol/L (ref 3.5–5.3)
Sodium: 140 mmol/L (ref 135–146)
Total Bilirubin: 0.4 mg/dL (ref 0.2–1.2)
Total Protein: 6.9 g/dL (ref 6.1–8.1)

## 2019-05-10 LAB — CBC WITH DIFFERENTIAL/PLATELET
Absolute Monocytes: 442 cells/uL (ref 200–950)
Basophils Absolute: 29 cells/uL (ref 0–200)
Basophils Relative: 0.9 %
Eosinophils Absolute: 221 cells/uL (ref 15–500)
Eosinophils Relative: 6.9 %
HCT: 37.9 % (ref 35.0–45.0)
Hemoglobin: 11.6 g/dL — ABNORMAL LOW (ref 11.7–15.5)
Lymphs Abs: 960 cells/uL (ref 850–3900)
MCH: 25.2 pg — ABNORMAL LOW (ref 27.0–33.0)
MCHC: 30.6 g/dL — ABNORMAL LOW (ref 32.0–36.0)
MCV: 82.2 fL (ref 80.0–100.0)
MPV: 11.4 fL (ref 7.5–12.5)
Monocytes Relative: 13.8 %
Neutro Abs: 1549 cells/uL (ref 1500–7800)
Neutrophils Relative %: 48.4 %
Platelets: 304 10*3/uL (ref 140–400)
RBC: 4.61 10*6/uL (ref 3.80–5.10)
RDW: 15.2 % — ABNORMAL HIGH (ref 11.0–15.0)
Total Lymphocyte: 30 %
WBC: 3.2 10*3/uL — ABNORMAL LOW (ref 3.8–10.8)

## 2019-05-10 NOTE — Progress Notes (Signed)
WBC low. Will continue to monitor.

## 2019-05-15 ENCOUNTER — Other Ambulatory Visit: Payer: Self-pay | Admitting: Rheumatology

## 2019-05-15 DIAGNOSIS — Z79899 Other long term (current) drug therapy: Secondary | ICD-10-CM

## 2019-05-15 NOTE — Telephone Encounter (Signed)
Last Visit: 04/05/2019 telemedicine  Next Visit: 07/10/2019 Labs: 05/09/2019 WBC low. Will continue to monitor.  Okay to refill per Dr. Estanislado Pandy.

## 2019-06-26 ENCOUNTER — Other Ambulatory Visit: Payer: Self-pay | Admitting: Rheumatology

## 2019-06-26 DIAGNOSIS — Z9225 Personal history of immunosupression therapy: Secondary | ICD-10-CM

## 2019-06-26 NOTE — Progress Notes (Signed)
Office Visit Note  Patient: Taylor Kirby             Date of Birth: 22-Nov-1956           MRN: [id number]             PCP: Harlan Stains, MD Referring: Harlan Stains, MD Visit Date: 07/10/2019 Occupation: @GUAROCC @  Subjective:  Medication monitoring    History of Present Illness: Taylor Kirby is a 63 y.o. female with history of psoriatic arthritis and osteoporosis.  She is on Enbrel 50 mq weekly injections, MTX 4 tablets by mouth once weekly, and folic acid 1 mg po daily.  She denies any recent psoriatic arthritis flares.  She denies any joint pain or joint swelling.  She denies any morning stiffness.  She has no concerns at this time. She continues to take Fosamax 70 mg po once weekly for management of osteoporosis.     Activities of Daily Living:  Patient reports morning stiffness for 0 minutes.   Patient Denies nocturnal pain.  Difficulty dressing/grooming: Denies Difficulty climbing stairs: Denies Difficulty getting out of chair: Denies Difficulty using hands for taps, buttons, cutlery, and/or writing: Denies  Review of Systems  Constitutional: Negative for fatigue.  HENT: Negative for mouth sores, nosebleeds and mouth dryness.   Eyes: Positive for dryness. Negative for pain and itching.  Respiratory: Negative for cough, shortness of breath, wheezing and difficulty breathing.   Cardiovascular: Negative for chest pain and palpitations.  Gastrointestinal: Negative for abdominal pain, blood in stool, constipation and diarrhea.  Endocrine: Negative for increased urination.  Genitourinary: Negative for painful urination.  Musculoskeletal: Negative for arthralgias, joint pain, joint swelling and morning stiffness.  Skin: Negative for rash, hair loss and redness.  Allergic/Immunologic: Negative for susceptible to infections.  Neurological: Negative for dizziness, tremors, numbness, headaches and memory loss.  Hematological: Negative for swollen glands.   Psychiatric/Behavioral: Negative for confusion and sleep disturbance.    PMFS History:  Patient Active Problem List   Diagnosis Date Noted  . History of anemia 03/10/2017  . 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 Anemia 10/05/2016  . Osteopenia 10/05/2016  . 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 High risk medication use 10/03/2016  . Psoriatic arthritis ([place]) 10/25/2015    Past Medical History:  Diagnosis Date  . Allergy   . Anemia   . Heart murmur   . Osteoporosis   . Rheumatoid arthritis ([place])     Family History  Problem Relation Age of Onset  . Hyperlipidemia Mother   . Hypertension Mother   . Stroke Mother   . Hyperlipidemia Father   . Cancer Father   . Hypertension Sister   . Heart disease Maternal Grandmother   . Cancer Maternal Grandfather   . Hypertension Paternal Grandmother    Past Surgical History:  Procedure Laterality Date  . TUBAL LIGATION     Social History   Social History Narrative  . Not on file    There is no immunization history on file for this patient.   Objective: Vital Signs: BP 121/82 (BP Location: Left Arm, Patient Position: Sitting, Cuff Size: Normal)   Pulse 86   Resp 13   Ht 5\' 4"  (1.626 m)   Wt 124 lb (56.2 kg)   BMI 21.28 kg/m    Physical Exam Vitals signs and nursing note reviewed.  Constitutional:      Appearance: She is well-developed.  HENT:     Head: Normocephalic and atraumatic.  Eyes:  Conjunctiva/sclera: Conjunctivae normal.  Neck:     Musculoskeletal: Normal range of motion.  Cardiovascular:     Rate and Rhythm: Normal rate and regular rhythm.     Heart sounds: Normal heart sounds.  Pulmonary:     Effort: Pulmonary effort is normal.     Breath sounds: Normal breath sounds.  Abdominal:     General: Bowel sounds are normal.     Palpations: Abdomen is soft.  Lymphadenopathy:     Cervical: No cervical adenopathy.  Skin:    General: Skin is warm and dry.     Capillary Refill: Capillary refill takes  less than 2 seconds.  Neurological:     Mental Status: She is alert and oriented to person, place, and time.  Psychiatric:        Behavior: Behavior normal.      Musculoskeletal Exam: C-spine, thoracic spine, and lumbar spine good ROM.  No midline spinal tenderness.  No SI joint tenderness.  Shoulder joints good ROM. Right elbow joint contracture.  Left elbow has good ROM.  Wrist joints have good ROM with no tenderness or inflammation. Incomplete fist formation of right hand. Arthritis mutilans with DIP subluxation.  Hip joints, knee joints, ankle joints, MTPs, PIPs and DIPs good ROM with no synovitis.  No warmth or effusion of knee joints.    CDAI Exam: CDAI Score: 0.4  Patient Global: 2 mm; Provider Global: 2 mm Swollen: 0 ; Tender: 0  Joint Exam   No joint exam has been documented for this visit   There is currently no information documented on the homunculus. Go to the Rheumatology activity and complete the homunculus joint exam.  Investigation: No additional findings.  Imaging: No results found.  Recent Labs: Lab Results  Component Value Date   WBC 3.2 (L) 05/09/2019   HGB 11.6 (L) 05/09/2019   PLT 304 05/09/2019   NA 140 05/09/2019   K 4.9 05/09/2019   CL 104 05/09/2019   CO2 29 05/09/2019   GLUCOSE 58 (L) 05/09/2019   BUN 19 05/09/2019   CREATININE 0.81 05/09/2019   BILITOT 0.4 05/09/2019   ALKPHOS 95 02/04/2018   AST 17 05/09/2019   ALT 11 05/09/2019   PROT 6.9 05/09/2019   ALBUMIN 3.7 02/04/2018   CALCIUM 9.4 05/09/2019   GFRAA 91 05/09/2019   QFTBGOLDPLUS NEGATIVE 10/13/2018    Speciality Comments: No specialty comments available.  Procedures:  No procedures performed Allergies: Penicillins and Sulfa antibiotics   Assessment / Plan:     Visit Diagnoses: Psoriatic arthritis ([place]) -She has no synovitis or dactylitis on exam.  She has not had any recent psoriatic arthritis flares.  She is clinically doing well on Enbrel 50 mg sq weekly injections, MTX 4  tablets po once weekly, and folic acid 1 mg po daily.  She has no joint pain or joint swelling at this time.  She has no morning stiffness.  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  She has incomplete fist formation of the right hand.  Chronic right elbow joint contracture noted but no tenderness or inflammation noted.  She will continue on the current treatment regimen.  A refill of MTX was sent to the pharmacy.  She was advised to notify us if she develops increased joint pain or joint swelling.  She will follow up in the office in 5 months.    Other psoriasis - She has no psoriasis at this time.   High risk medication use - Enbrel 50 mg  every 7 days, methotrexate 2.5 mg 4 tablets every 7 days, folic acid 1 mg 1 tablet daily.  Last TB gold negative on 10/13/2018 and will monitor yearly.  Most recent CBC/CMP within normal limits except for anemia and low WBC count of 3.2 on 05/09/2019.  Due for CBC/CMP in September and will monitor every 3 months.  Standing orders placed.  I discussed the importance of holding Enbrel and MTX if she develops any signs or symptoms of an infection and to resume once the infection has cleared.    - Plan: CBC with Differential/Platelet, COMPLETE METABOLIC PANEL WITH GFR  Contracture of elbow joint, right - Chronic.  She has no tenderness or inflammation at this time.   Age-related osteoporosis without current pathological fracture - DEXA on 12/20/17 AP spine BMD 0.757 with T-score -3.6. She is currently taking Fosamax 70 mg 1 tablet by mouth once weekly.    Other medical conditions are listed as follows:   Renal calcinosis   History of anemia  Vitamin D deficiency     Orders: Orders Placed This Encounter  Procedures  . CBC with Differential/Platelet  . COMPLETE METABOLIC PANEL WITH GFR   Meds ordered this encounter  Medications  . methotrexate (RHEUMATREX) 2.5 MG tablet    Sig: Take 4 tablets by mouth once weekly. Caution:Chemotherapy. Protect  from light.    Dispense:  48 tablet    Refill:  0     Follow-Up Instructions: Return in about 5 months (around 12/10/2019) for Psoriatic arthritis, Osteoporosis.   Ofilia Neas, PA-C  Note - This record has been created using Dragon software.  Chart creation errors have been sought, but may not always  have been located. Such creation errors do not reflect on  the standard of medical care.

## 2019-06-26 NOTE — Telephone Encounter (Signed)
Last Visit: 04/05/19 Next Visit: 07/10/19 Labs: 05/09/19 WBC low.  TB gold: 10/13/18 Neg   Okay to refill per Dr. Estanislado Pandy

## 2019-07-09 ENCOUNTER — Other Ambulatory Visit: Payer: Self-pay | Admitting: Rheumatology

## 2019-07-10 ENCOUNTER — Other Ambulatory Visit: Payer: Self-pay

## 2019-07-10 ENCOUNTER — Encounter: Payer: Self-pay | Admitting: Physician Assistant

## 2019-07-10 ENCOUNTER — Ambulatory Visit: Payer: BC Managed Care – PPO | Admitting: Physician Assistant

## 2019-07-10 VITALS — BP 121/82 | HR 86 | Resp 13 | Ht 64.0 in | Wt 124.0 lb

## 2019-07-10 DIAGNOSIS — L408 Other psoriasis: Secondary | ICD-10-CM | POA: Diagnosis not present

## 2019-07-10 DIAGNOSIS — M810 Age-related osteoporosis without current pathological fracture: Secondary | ICD-10-CM

## 2019-07-10 DIAGNOSIS — M24521 Contracture, right elbow: Secondary | ICD-10-CM | POA: Diagnosis not present

## 2019-07-10 DIAGNOSIS — L4050 Arthropathic psoriasis, unspecified: Secondary | ICD-10-CM

## 2019-07-10 DIAGNOSIS — Z79899 Other long term (current) drug therapy: Secondary | ICD-10-CM | POA: Diagnosis not present

## 2019-07-10 DIAGNOSIS — E559 Vitamin D deficiency, unspecified: Secondary | ICD-10-CM

## 2019-07-10 DIAGNOSIS — Z862 Personal history of diseases of the blood and blood-forming organs and certain disorders involving the immune mechanism: Secondary | ICD-10-CM

## 2019-07-10 DIAGNOSIS — N29 Other disorders of kidney and ureter in diseases classified elsewhere: Secondary | ICD-10-CM

## 2019-07-10 MED ORDER — METHOTREXATE 2.5 MG PO TABS: ORAL_TABLET | ORAL | 0 refills | Status: DC

## 2019-07-10 NOTE — Telephone Encounter (Signed)
Last Visit: 04/05/19 Next Visit: 07/10/19 Labs: 05/09/19 WBC low  Okay to refill per Dr. Estanislado Pandy

## 2019-07-10 NOTE — Patient Instructions (Signed)
Standing Labs We placed an order today for your standing lab work.    Please come back and get your standing labs in September and every 3 months  We have open lab daily Monday through Thursday from 8:30-12:30 PM and 1:30-4:30 PM and Friday from 8:30-12:30 PM and 1:30 -4:00 PM at the office of Dr. Shaili Deveshwar.   You may experience shorter wait times on Monday and Friday afternoons. The office is located at [address], [address], [place],  [address] No appointment is necessary.   Labs are drawn by [facility].  You may receive a bill from [facility] for your lab work.  If you wish to have your labs drawn at another location, please call the office 24 hours in advance to send orders.  If you have any questions regarding directions or hours of operation,  please call [phone].   Just as a reminder please drink plenty of water prior to coming for your lab work. Thanks!  

## 2019-08-31 ENCOUNTER — Other Ambulatory Visit: Payer: Self-pay

## 2019-08-31 DIAGNOSIS — Z79899 Other long term (current) drug therapy: Secondary | ICD-10-CM

## 2019-09-01 LAB — CBC WITH DIFFERENTIAL/PLATELET
Absolute Monocytes: 231 cells/uL (ref 200–950)
Basophils Absolute: 30 cells/uL (ref 0–200)
Basophils Relative: 0.9 %
Eosinophils Absolute: 142 cells/uL (ref 15–500)
Eosinophils Relative: 4.3 %
HCT: 40.7 % (ref 35.0–45.0)
Hemoglobin: 12.3 g/dL (ref 11.7–15.5)
Lymphs Abs: 1337 cells/uL (ref 850–3900)
MCH: 24.8 pg — ABNORMAL LOW (ref 27.0–33.0)
MCHC: 30.2 g/dL — ABNORMAL LOW (ref 32.0–36.0)
MCV: 82.1 fL (ref 80.0–100.0)
MPV: 11.3 fL (ref 7.5–12.5)
Monocytes Relative: 7 %
Neutro Abs: 1561 cells/uL (ref 1500–7800)
Neutrophils Relative %: 47.3 %
Platelets: 287 10*3/uL (ref 140–400)
RBC: 4.96 10*6/uL (ref 3.80–5.10)
RDW: 15.8 % — ABNORMAL HIGH (ref 11.0–15.0)
Total Lymphocyte: 40.5 %
WBC: 3.3 10*3/uL — ABNORMAL LOW (ref 3.8–10.8)

## 2019-09-01 LAB — COMPLETE METABOLIC PANEL WITH GFR
AG Ratio: 1.1 (calc) (ref 1.0–2.5)
ALT: 13 U/L (ref 6–29)
AST: 20 U/L (ref 10–35)
Albumin: 3.9 g/dL (ref 3.6–5.1)
Alkaline phosphatase (APISO): 68 U/L (ref 37–153)
BUN: 14 mg/dL (ref 7–25)
CO2: 28 mmol/L (ref 20–32)
Calcium: 9.5 mg/dL (ref 8.6–10.4)
Chloride: 102 mmol/L (ref 98–110)
Creat: 0.82 mg/dL (ref 0.50–0.99)
GFR, Est African American: 90 mL/min/{1.73_m2} (ref >=60)
GFR, Est Non African American: 77 mL/min/{1.73_m2} (ref >=60)
Globulin: 3.4 g/dL (calc) (ref 1.9–3.7)
Glucose, Bld: 88 mg/dL (ref 65–99)
Potassium: 4.3 mmol/L (ref 3.5–5.3)
Sodium: 138 mmol/L (ref 135–146)
Total Bilirubin: 0.5 mg/dL (ref 0.2–1.2)
Total Protein: 7.3 g/dL (ref 6.1–8.1)

## 2019-09-01 NOTE — Progress Notes (Signed)
CMP WNL. CBC stable.

## 2019-09-24 ENCOUNTER — Other Ambulatory Visit: Payer: Self-pay | Admitting: Rheumatology

## 2019-09-25 NOTE — Telephone Encounter (Signed)
Last Visit: 07/10/19 Next Visit: 12/11/19 Labs: 08/31/19 CMP WNL. CBC stable  Okay to refill per Dr. Estanislado Pandy

## 2019-10-17 ENCOUNTER — Other Ambulatory Visit: Payer: Self-pay | Admitting: Physician Assistant

## 2019-10-17 DIAGNOSIS — Z79899 Other long term (current) drug therapy: Secondary | ICD-10-CM

## 2019-10-17 NOTE — Telephone Encounter (Signed)
Last Visit: 07/10/19 Next Visit: 12/11/19 Labs: 08/31/19 CMP WNL. CBC stable  Okay to refill per Dr. Estanislado Pandy

## 2019-10-24 ENCOUNTER — Telehealth: Payer: Self-pay | Admitting: Pharmacy Technician

## 2019-10-24 NOTE — Telephone Encounter (Signed)
Received notification from [facility] regarding a prior authorization for ENBREL. Authorization has been APPROVED from 10/24/19 to 10/23/20.   Will send document to scan center.  Authorization # [id number]   1:59 PM Beatriz Chancellor, CPhT

## 2019-10-24 NOTE — Telephone Encounter (Signed)
Submitted a Prior Authorization request to [facility] for ENBREL via Cover My Meds. Will update once we receive a response.

## 2019-11-06 ENCOUNTER — Other Ambulatory Visit (HOSPITAL_COMMUNITY): Payer: Self-pay | Admitting: Urology

## 2019-11-06 ENCOUNTER — Other Ambulatory Visit: Payer: Self-pay | Admitting: Urology

## 2019-11-06 DIAGNOSIS — D4102 Neoplasm of uncertain behavior of left kidney: Secondary | ICD-10-CM

## 2019-11-09 ENCOUNTER — Other Ambulatory Visit: Payer: Self-pay

## 2019-11-09 ENCOUNTER — Encounter (HOSPITAL_COMMUNITY): Payer: Self-pay

## 2019-11-09 ENCOUNTER — Ambulatory Visit (HOSPITAL_COMMUNITY)
Admission: RE | Admit: 2019-11-09 | Discharge: 2019-11-09 | Disposition: A | Discharge status: Home / Self Care | Payer: BC Managed Care – PPO | Source: Ambulatory Visit | Attending: Urology | Admitting: Urology

## 2019-11-09 DIAGNOSIS — D4102 Neoplasm of uncertain behavior of left kidney: Secondary | ICD-10-CM | POA: Insufficient documentation

## 2019-11-09 MED ORDER — GADOBUTROL 1 MMOL/ML IV SOLN
6.0000 mL | Freq: Once | INTRAVENOUS | Status: AC | PRN
  Administered 2019-11-09: 21:00:00 6 mL via INTRAVENOUS

## 2019-11-10 ENCOUNTER — Other Ambulatory Visit: Payer: Self-pay | Admitting: Rheumatology

## 2019-11-10 DIAGNOSIS — Z79899 Other long term (current) drug therapy: Secondary | ICD-10-CM

## 2019-12-06 ENCOUNTER — Telehealth: Payer: Self-pay | Admitting: Rheumatology

## 2019-12-06 DIAGNOSIS — Z111 Encounter for screening for respiratory tuberculosis: Secondary | ICD-10-CM

## 2019-12-06 DIAGNOSIS — Z79899 Other long term (current) drug therapy: Secondary | ICD-10-CM

## 2019-12-06 NOTE — Progress Notes (Signed)
Virtual Visit via Telephone Note  I connected with Taylor Kirby on 12/11/19 at  9:30 AM EST by telephone and verified that I am speaking with the correct person using two identifiers.  Location: Patient: Home  Provider: Clinic  This service was conducted via virtual visit.  The patient was located at home. I was located in my office.  Consent was obtained prior to the virtual visit and is aware of possible charges through their insurance for this visit.  The patient is an established patient.  Dr. Estanislado Pandy, MD conducted the virtual visit and [name], PA-C acted as scribe during the service.  Office staff helped with scheduling follow up visits after the service was conducted.     I discussed the limitations, risks, security and privacy concerns of performing an evaluation and management service by telephone and the availability of in person appointments. I also discussed with the patient that there may be a patient responsible charge related to this service. The patient expressed understanding and agreed to proceed.  CC: medication monitoring  History of Present Illness: Patient is a 64 year old female with a past medical history of psoriatic arthritis and osteoporosis. She is on Enbrel 50 mg sq injections once weekly, MTX 4 tablets po once weekly, and folic acid 1 mg po daily.  She states 2 weeks ago she experienced left 5th digit pain, but she states her discomfort resolved after taking a dose of ibuprofen.  She denies any overuse activities leading up to the discomfort.  She denies any right elbow joint pain or inflammation. She denies any joint pain or joint swelling currently. She states she is working on getting an at home gym.  She will have treadmill soon.  Review of Systems  Constitutional: Negative for fever and malaise/fatigue.  Eyes: Negative for photophobia, pain, discharge and redness.  Respiratory: Negative for cough, shortness of breath and wheezing.   Cardiovascular:  Negative for chest pain and palpitations.  Gastrointestinal: Negative for blood in stool, constipation and diarrhea.  Genitourinary: Negative for dysuria.  Musculoskeletal: Negative for back pain, joint pain, myalgias and neck pain.  Skin: Negative for rash.  Neurological: Negative for dizziness and headaches.  Psychiatric/Behavioral: Negative for depression. The patient is not nervous/anxious and does not have insomnia.       Observations/Objective: Physical Exam  Constitutional: She is oriented to person, place, and time.  Neurological: She is alert and oriented to person, place, and time.  Psychiatric: Mood, memory, affect and judgment normal.   Patient reports morning stiffness for 0  minutes.   Patient denies nocturnal pain.  Difficulty dressing/grooming: Denies Difficulty climbing stairs: Denies Difficulty getting out of chair: Denies Difficulty using hands for taps, buttons, cutlery, and/or writing: Denies   Assessment and Plan: Visit Diagnoses: Psoriatic arthritis ([place]) -She has not had any recent psoriatic arthritis flares.  She is clinically doing well on Enbrel 50 mg sq injections once weekly, MTX 4 tablets po once weekly, and folic acid 1 mg po daily.  She experienced mild discomfort in the left 5th digit 2 weeks ago, which resolved after 1 dose of ibuprofen.  She has no joint pain or joint inflammation this time.  She has no SI joint discomfort, achilles tendonitis, or plantar fasciitis.  She will continue on Enbrel 50 mg sq injections once weekly, MTX 4 tablets weekly, and folic acid 1 mg po daily.  She was advised to notify us if she develops increased joint pain or joint swelling.  She  will follow up in 4-5 months.   Other psoriasis - She has intermittent patches of dry skin.  She was encouraged to use a daily moisturizer.   High risk medication use - Enbrel 50 mg every 7 days, methotrexate 2.5 mg 4 tablets every 7 days, folic acid 1 mg 1 tablet daily. CBC and CMP were  drawn on 12/06/18.  TB gold negative on 12/06/18.  She was encouraged to receive the covid-19 vaccine once available.   Contracture of elbow joint, right - Chronic.  She has no tenderness or inflammation at this time.   Age-related osteoporosis without current pathological fracture - DEXA on 12/20/17 AP spine BMD 0.757 with T-score -3.6. She is currently taking Fosamax 70 mg 1 tablet by mouth once weekly.    Other medical conditions are listed as follows:   Renal calcinosis   History of anemia  Vitamin D deficiency   Follow Up Instructions: She will follow up in 4-5 months.    I discussed the assessment and treatment plan with the patient. The patient was provided an opportunity to ask questions and all were answered. The patient agreed with the plan and demonstrated an understanding of the instructions.   The patient was advised to call back or seek an in-person evaluation if the symptoms worsen or if the condition fails to improve as anticipated.  I provided 15 minutes of non-face-to-face time during this encounter.   Bo Merino, MD   Scribed by-  [name] PA-C

## 2019-12-06 NOTE — Telephone Encounter (Signed)
Lab orders released for [facility].  

## 2019-12-06 NOTE — Telephone Encounter (Signed)
Patient going to [facility] for lab work today. Please release orders.

## 2019-12-08 NOTE — Telephone Encounter (Signed)
CMP WNL. CBC stable.

## 2019-12-09 LAB — QUANTIFERON-TB GOLD PLUS
Mitogen-NIL: >10 IU/mL
NIL: 0.03 IU/mL
QuantiFERON-TB Gold Plus: NEGATIVE
TB1-NIL: 0 IU/mL
TB2-NIL: 0.01 IU/mL

## 2019-12-09 LAB — CBC WITH DIFFERENTIAL/PLATELET
Absolute Monocytes: 344 cells/uL (ref 200–950)
Basophils Absolute: 32 cells/uL (ref 0–200)
Basophils Relative: 0.8 %
Eosinophils Absolute: 112 cells/uL (ref 15–500)
Eosinophils Relative: 2.8 %
HCT: 38.4 % (ref 35.0–45.0)
Hemoglobin: 11.9 g/dL (ref 11.7–15.5)
Lymphs Abs: 1372 cells/uL (ref 850–3900)
MCH: 25.4 pg — ABNORMAL LOW (ref 27.0–33.0)
MCHC: 31 g/dL — ABNORMAL LOW (ref 32.0–36.0)
MCV: 82.1 fL (ref 80.0–100.0)
MPV: 11 fL (ref 7.5–12.5)
Monocytes Relative: 8.6 %
Neutro Abs: 2140 cells/uL (ref 1500–7800)
Neutrophils Relative %: 53.5 %
Platelets: 290 10*3/uL (ref 140–400)
RBC: 4.68 10*6/uL (ref 3.80–5.10)
RDW: 15.4 % — ABNORMAL HIGH (ref 11.0–15.0)
Total Lymphocyte: 34.3 %
WBC: 4 10*3/uL (ref 3.8–10.8)

## 2019-12-09 LAB — COMPLETE METABOLIC PANEL WITH GFR
AG Ratio: 1.2 (calc) (ref 1.0–2.5)
ALT: 23 U/L (ref 6–29)
AST: 30 U/L (ref 10–35)
Albumin: 4 g/dL (ref 3.6–5.1)
Alkaline phosphatase (APISO): 77 U/L (ref 37–153)
BUN: 16 mg/dL (ref 7–25)
CO2: 30 mmol/L (ref 20–32)
Calcium: 9.1 mg/dL (ref 8.6–10.4)
Chloride: 100 mmol/L (ref 98–110)
Creat: 0.73 mg/dL (ref 0.50–0.99)
GFR, Est African American: 102 mL/min/{1.73_m2} (ref >=60)
GFR, Est Non African American: 88 mL/min/{1.73_m2} (ref >=60)
Globulin: 3.3 g/dL (calc) (ref 1.9–3.7)
Glucose, Bld: 85 mg/dL (ref 65–99)
Potassium: 4.2 mmol/L (ref 3.5–5.3)
Sodium: 136 mmol/L (ref 135–146)
Total Bilirubin: 0.4 mg/dL (ref 0.2–1.2)
Total Protein: 7.3 g/dL (ref 6.1–8.1)

## 2019-12-11 ENCOUNTER — Telehealth (INDEPENDENT_AMBULATORY_CARE_PROVIDER_SITE_OTHER): Payer: BC Managed Care – PPO | Admitting: Rheumatology

## 2019-12-11 ENCOUNTER — Other Ambulatory Visit: Payer: Self-pay

## 2019-12-11 ENCOUNTER — Encounter: Payer: Self-pay | Admitting: Rheumatology

## 2019-12-11 DIAGNOSIS — N29 Other disorders of kidney and ureter in diseases classified elsewhere: Secondary | ICD-10-CM

## 2019-12-11 DIAGNOSIS — Z862 Personal history of diseases of the blood and blood-forming organs and certain disorders involving the immune mechanism: Secondary | ICD-10-CM

## 2019-12-11 DIAGNOSIS — Z79899 Other long term (current) drug therapy: Secondary | ICD-10-CM | POA: Diagnosis not present

## 2019-12-11 DIAGNOSIS — L408 Other psoriasis: Secondary | ICD-10-CM

## 2019-12-11 DIAGNOSIS — M24521 Contracture, right elbow: Secondary | ICD-10-CM | POA: Diagnosis not present

## 2019-12-11 DIAGNOSIS — E559 Vitamin D deficiency, unspecified: Secondary | ICD-10-CM

## 2019-12-11 DIAGNOSIS — M810 Age-related osteoporosis without current pathological fracture: Secondary | ICD-10-CM

## 2019-12-11 DIAGNOSIS — L4050 Arthropathic psoriasis, unspecified: Secondary | ICD-10-CM | POA: Diagnosis not present

## 2019-12-19 ENCOUNTER — Other Ambulatory Visit: Payer: Self-pay | Admitting: Rheumatology

## 2019-12-19 DIAGNOSIS — Z79899 Other long term (current) drug therapy: Secondary | ICD-10-CM

## 2019-12-20 MED ORDER — ALENDRONATE SODIUM 70 MG PO TABS: ORAL_TABLET | ORAL | 0 refills | Status: DC

## 2019-12-20 NOTE — Telephone Encounter (Signed)
Last Visit: 12/11/19 Next Visit: due May/June 2021. Message sent to the front to schedule patient  Labs: 12/07/19 CMP WNL. CBC stable  To early to refill MTX. Okay to refill Fosamax.   Okay to refill per Dr. Estanislado Pandy

## 2019-12-20 NOTE — Telephone Encounter (Signed)
Please schedule patient for a follow up visit. Patient due May/ June 2021. Thanks!

## 2019-12-20 NOTE — Telephone Encounter (Signed)
[facility] for patient to call and schedule her follow-up appointment in May.

## 2019-12-21 ENCOUNTER — Telehealth: Payer: Self-pay | Admitting: Rheumatology

## 2019-12-21 NOTE — Telephone Encounter (Signed)
I LMOM for patient to call, and schedule a follow up appointment around 05/10/20.

## 2019-12-21 NOTE — Telephone Encounter (Signed)
-----   Message from Shona Needles, RT sent at 12/11/2019  1:40 PM EST ----- Regarding: 4-5 MONTH F/U

## 2020-01-01 ENCOUNTER — Other Ambulatory Visit: Payer: Self-pay | Admitting: Rheumatology

## 2020-01-01 DIAGNOSIS — Z9225 Personal history of immunosupression therapy: Secondary | ICD-10-CM

## 2020-01-01 NOTE — Telephone Encounter (Signed)
Last Visit: 12/11/19 Next Visit: due May/June 2021. Labs: 12/07/19 CMP WNL. CBC stable TB Gold: 12/07/19 Neg   Okay to refill per Dr. Estanislado Pandy

## 2020-01-15 ENCOUNTER — Ambulatory Visit: Payer: BC Managed Care – PPO | Attending: Internal Medicine

## 2020-01-21 IMAGING — MR MR ABDOMEN WO/W CM
9 of 18 series · 21 of 48 positions shown · IV contrast (gadavist)
Comparison: No prior abdominal MRI. CT the abdomen and pelvis
10/23/2019.

CLINICAL DATA: 63-year-old female with history of left renal
neoplasm.

EXAM:
MRI ABDOMEN WITHOUT AND WITH CONTRAST
TECHNIQUE: Multiplanar multisequence MR imaging of the abdomen was performed
both before and after the administration of intravenous contrast.
CONTRAST:  6mL GADAVIST GADOBUTROL 1 MMOL/ML IV SOLN

[Series 3: DWI b500 · axial · 6.0mm · 1.41mm/px · z∈[-112,+153]mm · 3 of 70 slices shown]
[im 1/70]
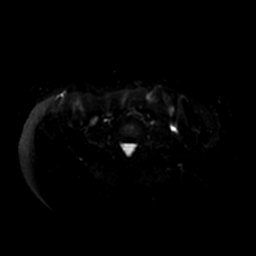
[im 35/70]
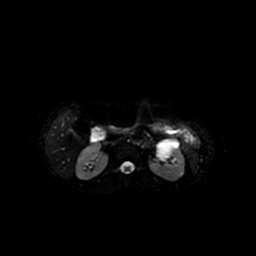
[im 70/70]
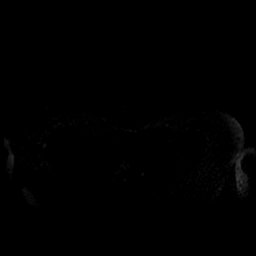

[Series 4: T2 fat-sat · axial · 5.0mm · 0.78mm/px · z∈[-114,+156]mm · 2 of 55 slices shown]
[im 1/55]
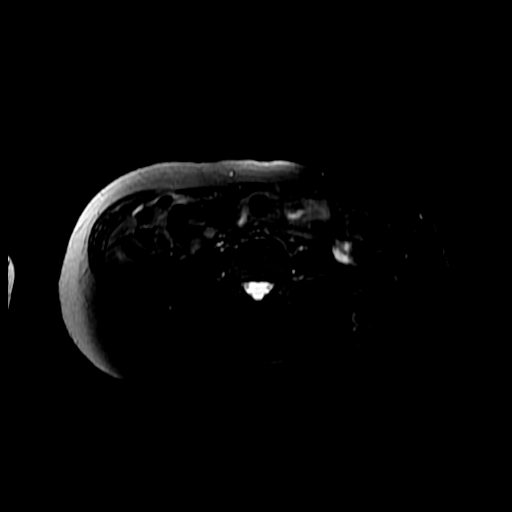
[im 55/55]
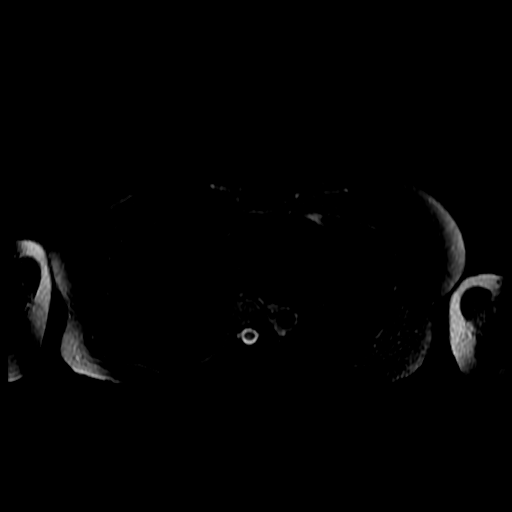

[Series 5: T2 · axial · 5.0mm · 0.78mm/px · z∈[-114,+156]mm · 2 of 55 slices shown (1 of 2)]
[im 1/55]
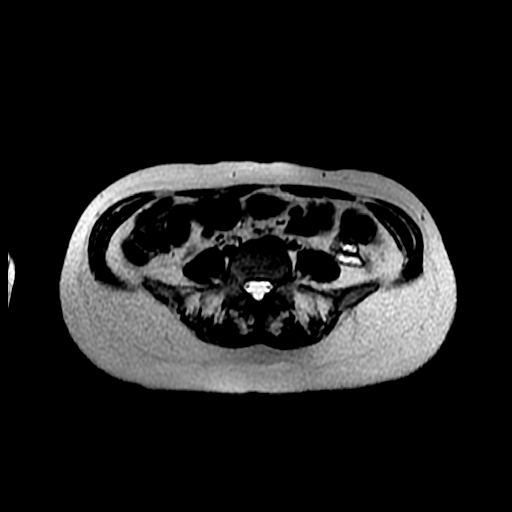
[im 55/55]
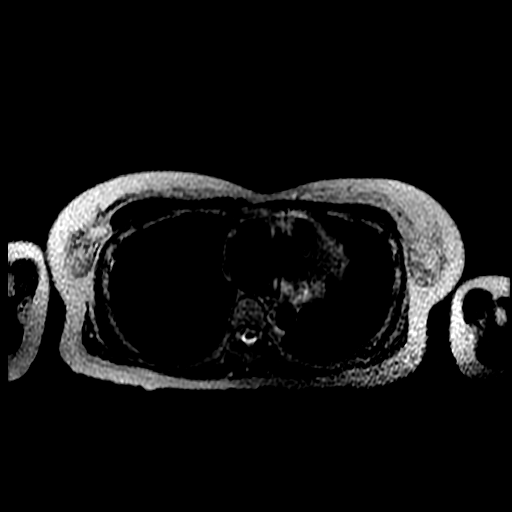

[Series 6: T2 · coronal · 5.0mm · 0.78mm/px · 1 of 32 slices shown (2 of 2)]
[im 1/32]
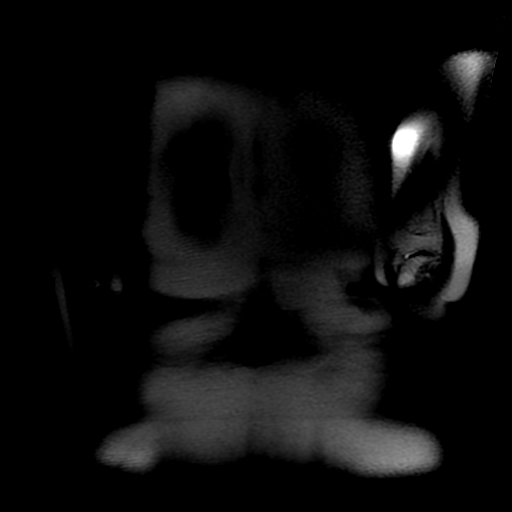

[Series 7: bSSFP · axial · 5.0mm · 0.78mm/px · z∈[-114,+156]mm · 2 of 55 slices shown]
[im 1/55]
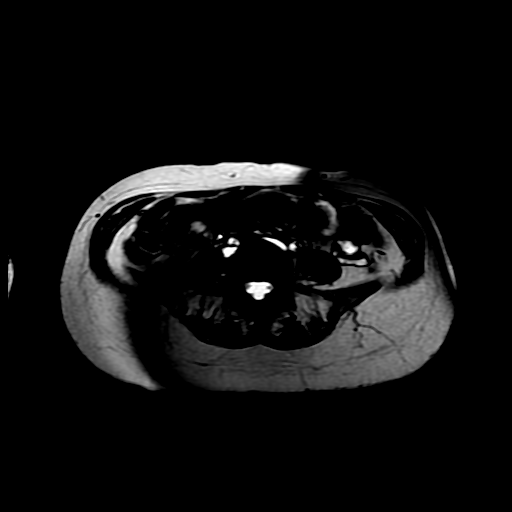
[im 55/55]
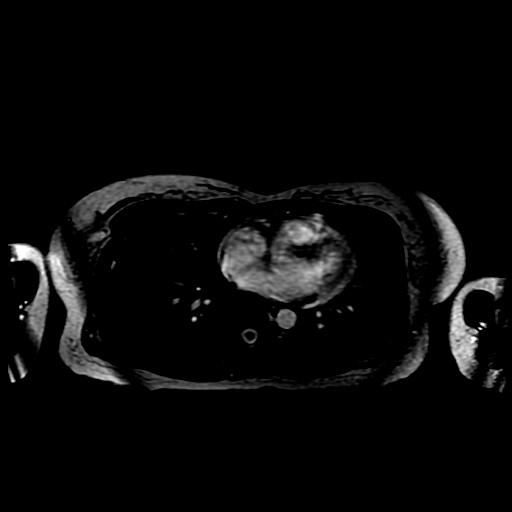

[Series 8: ax dualecho bh · axial · 5.0mm · 0.78mm/px · z∈[-114,+156]mm · 4 of 110 slices shown]
[im 1/110]
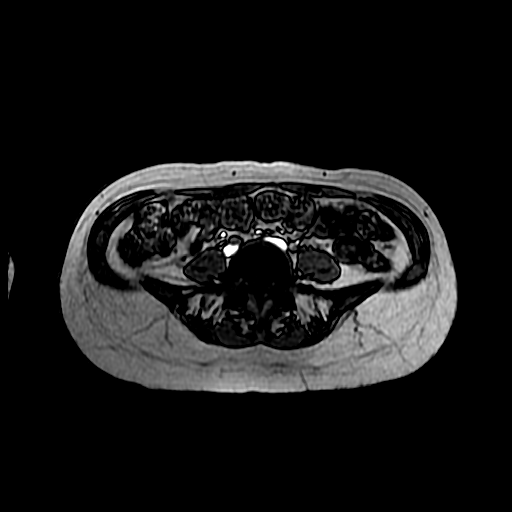
[im 37/110]
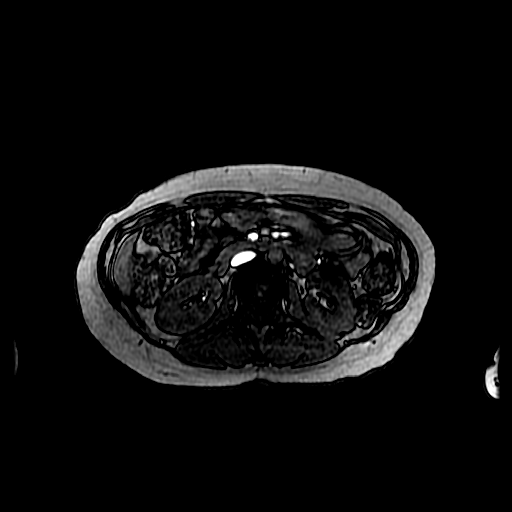
[im 73/110]
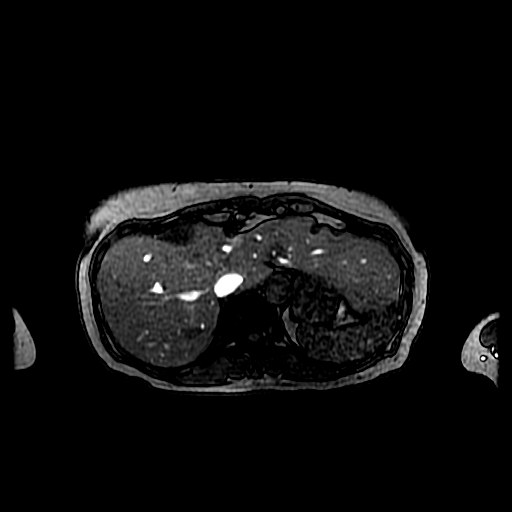
[im 110/110]
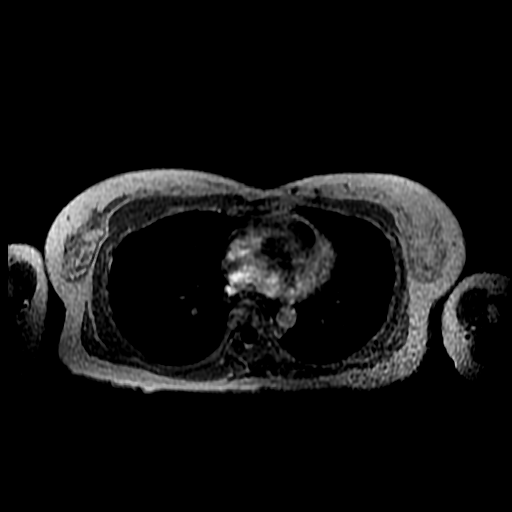

[Series 300: DWI · axial · 6.0mm · 1.41mm/px · 1 of 35 slices shown]
[im 1/35]
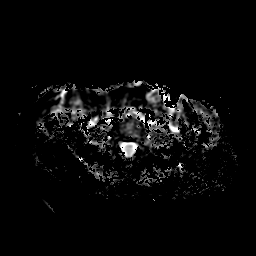

[Series 900: T1 dynamic · axial · 5.8mm · 0.78mm/px · z∈[-98,+154]mm · 3 of 88 slices shown (1 of 2)]
[im 1/88]
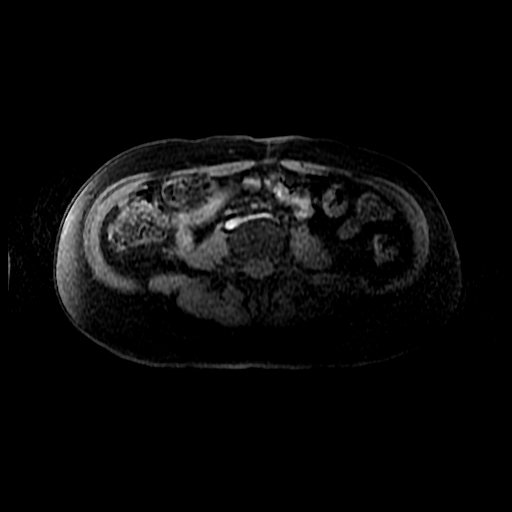
[im 44/88]
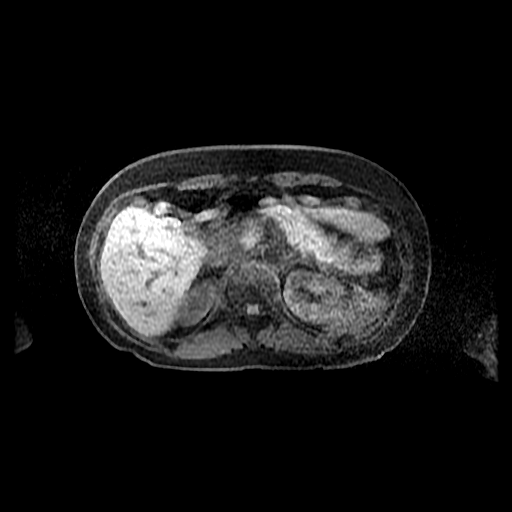
[im 88/88]
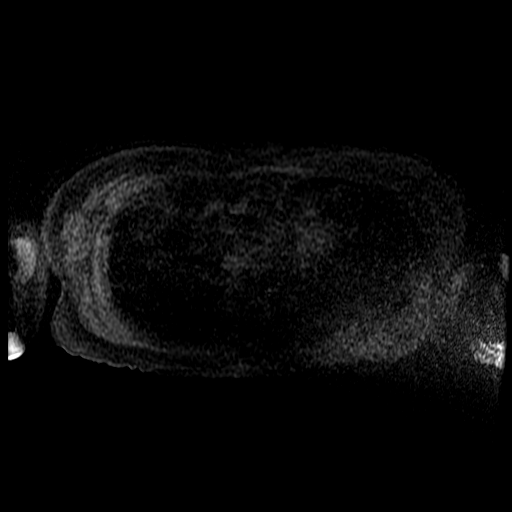

[Series 901: T1 dynamic · axial · 5.8mm · 0.78mm/px · z∈[-98,+154]mm · 3 of 88 slices shown (2 of 2)]
[im 1/88]
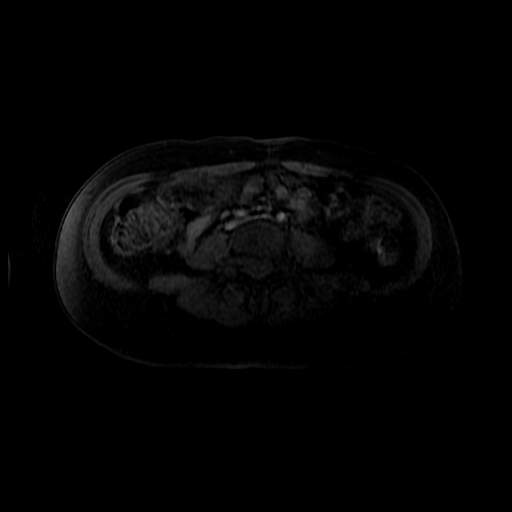
[im 44/88]
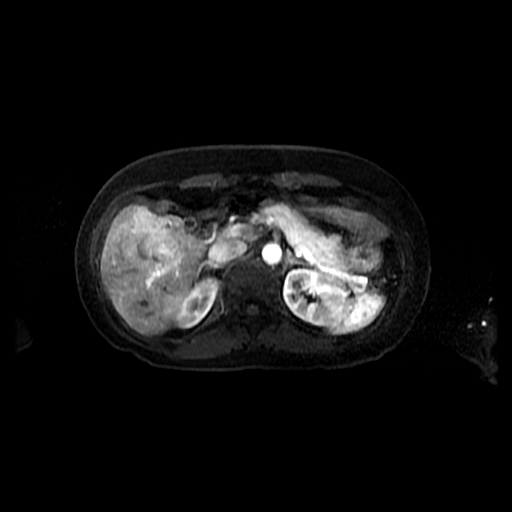
[im 88/88]
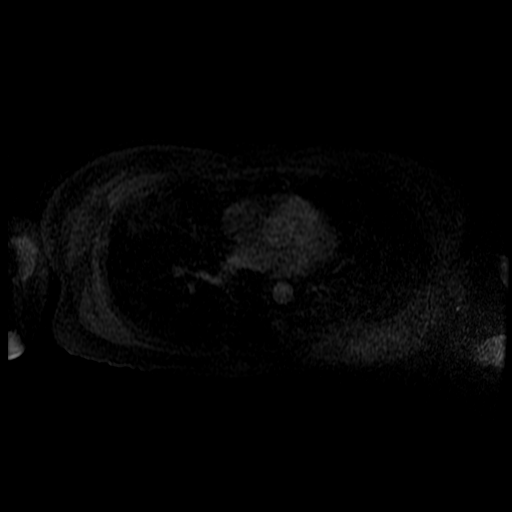

[21 of 48 positions shown; findings below may reference images not displayed]

FINDINGS: Lower chest: Unremarkable.

Hepatobiliary: Several subcentimeter T1 hypointense, T2
hyperintense, nonenhancing lesions are noted in the liver,
compatible with tiny simple cysts and/or biliary hamartomas. No
other larger more suspicious appearing hepatic lesions. No intra or
extrahepatic biliary ductal dilatation. Gallbladder is unremarkable
in appearance.

Pancreas: No pancreatic mass. No pancreatic ductal dilatation. No
pancreatic or peripancreatic fluid collections or inflammatory
changes.

Spleen:  Unremarkable.

Adrenals/Urinary Tract: In the interpolar region of the left kidney
there is a 4.4 cm T1 hypointense, T2 hyperintense, nonenhancing
lesion, compatible with a simple cyst. The lesion of concern in the
upper pole of the left kidney is centrally T1 hypointense,
demonstrates some peripheral T1 hyperintensity, is T2 hyperintense
and demonstrates some mural irregularity, with a thin internal
septation but no discernible internal enhancement on post gadolinium
images. Right kidney and bilateral adrenal glands are normal in
appearance. No hydroureteronephrosis in the visualized portions of
the abdomen.

Stomach/Bowel: Visualized portions are unremarkable.

Vascular/Lymphatic: No aneurysm identified in the visualized
abdominal vasculature. No lymphadenopathy noted in the abdomen.

Other: No significant volume of ascites noted in the visualized
portions of the peritoneal cavity.

Musculoskeletal: No aggressive appearing osseous lesions are noted
in the visualized portions of the skeleton.
IMPRESSION: 1. The lesion of concern in the upper pole of the left kidney has
imaging characteristics most compatible with a Bosniak class 2F
cyst. Repeat abdominal MRI with and without IV gadolinium is
recommended in 6 months to ensure the stability of this finding.
This recommendation follows ACR consensus guidelines: Management of
the Incidental Renal Mass on CT: A White Paper of the ACR Incidental
Findings Committee. [HOSPITAL] 7990;[DATE].
2. Additional incidental findings, as above.

## 2020-01-29 ENCOUNTER — Other Ambulatory Visit: Payer: Self-pay | Admitting: Rheumatology

## 2020-01-29 DIAGNOSIS — Z79899 Other long term (current) drug therapy: Secondary | ICD-10-CM

## 2020-01-29 NOTE — Telephone Encounter (Signed)
Last Visit: 12/11/19 Next Visit: 05/13/20 Labs: 12/07/19 CMP WNL. CBC stable.  Okay to refill per Dr. Estanislado Pandy

## 2020-02-09 ENCOUNTER — Other Ambulatory Visit: Payer: Self-pay | Admitting: Internal Medicine

## 2020-02-09 DIAGNOSIS — N181 Chronic kidney disease, stage 1: Secondary | ICD-10-CM

## 2020-02-12 ENCOUNTER — Ambulatory Visit
Admission: RE | Admit: 2020-02-12 | Discharge: 2020-02-12 | Disposition: A | Discharge status: Home / Self Care | Payer: BC Managed Care – PPO | Source: Ambulatory Visit | Attending: Internal Medicine | Admitting: Internal Medicine

## 2020-02-12 DIAGNOSIS — N181 Chronic kidney disease, stage 1: Secondary | ICD-10-CM

## 2020-02-16 ENCOUNTER — Other Ambulatory Visit: Payer: Self-pay | Admitting: Urology

## 2020-02-16 DIAGNOSIS — D4102 Neoplasm of uncertain behavior of left kidney: Secondary | ICD-10-CM

## 2020-03-16 ENCOUNTER — Other Ambulatory Visit: Payer: Self-pay | Admitting: Rheumatology

## 2020-03-18 NOTE — Telephone Encounter (Signed)
Last Visit:12/11/19 Next Visit: 05/13/20 Labs: 12/07/19 CMP WNL. CBC stable  Okay to refill per Dr. Estanislado Pandy

## 2020-04-24 ENCOUNTER — Other Ambulatory Visit: Payer: Self-pay | Admitting: Rheumatology

## 2020-04-24 DIAGNOSIS — Z79899 Other long term (current) drug therapy: Secondary | ICD-10-CM

## 2020-04-24 NOTE — Telephone Encounter (Signed)
Last Visit:12/11/19 Next Visit: 05/13/20 Labs: 12/07/19 CMP WNL. CBC stable.  Patient advised she is due to update labs. Patient states she will try to update labs on 04/25/2020.

## 2020-04-26 ENCOUNTER — Other Ambulatory Visit: Payer: Self-pay

## 2020-04-26 DIAGNOSIS — Z79899 Other long term (current) drug therapy: Secondary | ICD-10-CM

## 2020-04-27 LAB — CBC WITH DIFFERENTIAL/PLATELET
Absolute Monocytes: 289 cells/uL (ref 200–950)
Basophils Absolute: 51 cells/uL (ref 0–200)
Basophils Relative: 1.3 %
Eosinophils Absolute: 129 cells/uL (ref 15–500)
Eosinophils Relative: 3.3 %
HCT: 38.9 % (ref 35.0–45.0)
Hemoglobin: 11.8 g/dL (ref 11.7–15.5)
Lymphs Abs: 1583 cells/uL (ref 850–3900)
MCH: 25.3 pg — ABNORMAL LOW (ref 27.0–33.0)
MCHC: 30.3 g/dL — ABNORMAL LOW (ref 32.0–36.0)
MCV: 83.3 fL (ref 80.0–100.0)
MPV: 11.8 fL (ref 7.5–12.5)
Monocytes Relative: 7.4 %
Neutro Abs: 1849 cells/uL (ref 1500–7800)
Neutrophils Relative %: 47.4 %
Platelets: 278 10*3/uL (ref 140–400)
RBC: 4.67 10*6/uL (ref 3.80–5.10)
RDW: 15.6 % — ABNORMAL HIGH (ref 11.0–15.0)
Total Lymphocyte: 40.6 %
WBC: 3.9 10*3/uL (ref 3.8–10.8)

## 2020-04-27 LAB — COMPLETE METABOLIC PANEL WITH GFR
AG Ratio: 1.3 (calc) (ref 1.0–2.5)
ALT: 14 U/L (ref 6–29)
AST: 20 U/L (ref 10–35)
Albumin: 3.8 g/dL (ref 3.6–5.1)
Alkaline phosphatase (APISO): 78 U/L (ref 37–153)
BUN: 15 mg/dL (ref 7–25)
CO2: 28 mmol/L (ref 20–32)
Calcium: 9.3 mg/dL (ref 8.6–10.4)
Chloride: 103 mmol/L (ref 98–110)
Creat: 0.87 mg/dL (ref 0.50–0.99)
GFR, Est African American: 83 mL/min/{1.73_m2} (ref >=60)
GFR, Est Non African American: 71 mL/min/{1.73_m2} (ref >=60)
Globulin: 3 g/dL (calc) (ref 1.9–3.7)
Glucose, Bld: 92 mg/dL (ref 65–99)
Potassium: 4.4 mmol/L (ref 3.5–5.3)
Sodium: 138 mmol/L (ref 135–146)
Total Bilirubin: 0.5 mg/dL (ref 0.2–1.2)
Total Protein: 6.8 g/dL (ref 6.1–8.1)

## 2020-04-30 ENCOUNTER — Other Ambulatory Visit: Payer: Self-pay

## 2020-04-30 ENCOUNTER — Ambulatory Visit
Admission: RE | Admit: 2020-04-30 | Discharge: 2020-04-30 | Disposition: A | Discharge status: Home / Self Care | Payer: BC Managed Care – PPO | Source: Ambulatory Visit | Attending: Urology | Admitting: Urology

## 2020-04-30 DIAGNOSIS — D4102 Neoplasm of uncertain behavior of left kidney: Secondary | ICD-10-CM

## 2020-04-30 MED ORDER — GADOBENATE DIMEGLUMINE 529 MG/ML IV SOLN
11.0000 mL | Freq: Once | INTRAVENOUS | Status: AC | PRN
  Administered 2020-04-30: 11 mL via INTRAVENOUS

## 2020-04-30 NOTE — Progress Notes (Signed)
CMP WNL. CBC stable.

## 2020-04-30 NOTE — Progress Notes (Signed)
Office Visit Note  Patient: Taylor Kirby             Date of Birth: 1956/01/23           MRN: [id number]             PCP: Harlan Stains, MD Referring: Harlan Stains, MD Visit Date: 05/13/2020 Occupation: @GUAROCC @  Subjective:  Medication management   History of Present Illness: Taylor Kirby is a 64 y.o. female with history of psoriatic arthritis.  She states she has been doing well as regards to psoriatic arthritis.  She has reduce methotrexate to 3-1/2 tablets p.o. weekly along with Enbrel weekly.  She has not noticed any increased joint pain or joint swelling.  She states she had some discomfort in her abdominal region and her PCP was concerned about hematuria.  She was referred to nephrologist.  She was told that she has benign hematuria.  Her GFR was slightly lower than expected.  The nephrologist switched her from amlodipine to lisinopril.  Her blood pressure is better controlled.  Activities of Daily Living:  Patient reports morning stiffness for 0 minutes.   Patient Denies nocturnal pain.  Difficulty dressing/grooming: Denies Difficulty climbing stairs: Denies Difficulty getting out of chair: Denies Difficulty using hands for taps, buttons, cutlery, and/or writing: Denies  Review of Systems  Constitutional: Negative for fatigue, night sweats, weight gain and weight loss.  HENT: Negative for mouth sores, trouble swallowing, trouble swallowing, mouth dryness and nose dryness.   Eyes: Negative for pain, redness, itching, visual disturbance and dryness.  Respiratory: Negative for cough, shortness of breath and difficulty breathing.   Cardiovascular: Negative for chest pain, palpitations, hypertension, irregular heartbeat and swelling in legs/feet.  Gastrointestinal: Negative for blood in stool, constipation and diarrhea.  Endocrine: Negative for increased urination.  Genitourinary: Positive for hematuria. Negative for difficulty urinating and vaginal dryness.    Musculoskeletal: Negative for arthralgias, joint pain, joint swelling, myalgias, muscle weakness, morning stiffness, muscle tenderness and myalgias.  Skin: Negative for color change, rash, hair loss, redness, skin tightness, ulcers and sensitivity to sunlight.  Allergic/Immunologic: Negative for susceptible to infections.  Neurological: Negative for dizziness, numbness, headaches, memory loss, night sweats and weakness.  Hematological: Negative for bruising/bleeding tendency and swollen glands.  Psychiatric/Behavioral: Negative for depressed mood, confusion and sleep disturbance. The patient is not nervous/anxious.     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   High risk medication use 10/03/2016   Psoriatic arthritis ([place]) 10/25/2015    Past Medical History:  Diagnosis Date   Allergy    Anemia    Heart murmur    Osteoporosis    Rheumatoid arthritis ([place])     Family History  Problem Relation Age of Onset   Hyperlipidemia Mother    Hypertension Mother    Stroke Mother    Hyperlipidemia Father    Cancer Father    Heart attack Father    Hypertension Sister    Heart disease Maternal Grandmother    Cancer Maternal Grandfather    Hypertension Paternal Grandmother    Past Surgical History:  Procedure Laterality Date   TUBAL LIGATION     Social History   Social History Narrative   Not on file   Immunization History  Administered Date(s) Administered   Moderna SARS-COVID-2 Vaccination 01/27/2020,  02/24/2020     Objective: Vital Signs: BP 118/79 (BP Location: Left Arm, Patient Position: Sitting, Cuff Size: Normal)    Pulse 87    Resp 15    Ht 5\' 4"  (1.626 m)    Wt 126 lb 12.8 oz (57.5 kg)    BMI 21.77 kg/m    Physical Exam Vitals and nursing note reviewed.   Constitutional:      Appearance: She is well-developed.  HENT:     Head: Normocephalic and atraumatic.  Eyes:     Conjunctiva/sclera: Conjunctivae normal.  Cardiovascular:     Rate and Rhythm: Normal rate and regular rhythm.     Heart sounds: Normal heart sounds.  Pulmonary:     Effort: Pulmonary effort is normal.     Breath sounds: Normal breath sounds.  Abdominal:     General: Bowel sounds are normal.     Palpations: Abdomen is soft.  Musculoskeletal:     Cervical back: Normal range of motion.  Lymphadenopathy:     Cervical: No cervical adenopathy.  Skin:    General: Skin is warm and dry.     Capillary Refill: Capillary refill takes less than 2 seconds.  Neurological:     Mental Status: She is alert and oriented to person, place, and time.  Psychiatric:        Behavior: Behavior normal.      Musculoskeletal Exam: C-spine was in good range of motion.  She has limited range of motion of her lumbar spine.  Shoulder joints with good range of motion.  She is contracture in her right elbow joint without synovitis.  She has good range of motion of her wrist joints.  She has arthritis mutilans with shortening of bilateral.  She has PIP and DIP changes in her right hand.  No synovitis was noted.  Hip joints, knee joints, ankles, MTPs and PIPs with good range of motion with no synovitis.  CDAI Exam: CDAI Score: 0.6  Patient Global: 3 mm; Provider Global: 3 mm Swollen: 0 ; Tender: 0  Joint Exam 05/13/2020   No joint exam has been documented for this visit   There is currently no information documented on the homunculus. Go to the Rheumatology activity and complete the homunculus joint exam.  Investigation: No additional findings.  Imaging: MR ABDOMEN WWO CONTRAST  Result Date: 05/01/2020 CLINICAL DATA:  Follow-up indeterminate cystic lesion in the LEFT kidney. EXAM: MRI ABDOMEN WITHOUT AND WITH CONTRAST TECHNIQUE: Multiplanar multisequence MR imaging of the abdomen was  performed both before and after the administration of intravenous contrast. CONTRAST:  39mL MULTIHANCE GADOBENATE DIMEGLUMINE 529 MG/ML IV SOLN COMPARISON:  MRI 11/09/2019, ultrasound 02/12/2020, CT 10/23/2019 FINDINGS: Lower chest:  Lung bases are clear. Hepatobiliary: Several small benign cystic hepatic lesions. Gallbladder is decompressed. Common bile duct upper limits of normal at 6 mm. No obstruction identified. Pancreas: Normal pancreatic parenchymal intensity. No ductal dilatation or inflammation. Spleen: Normal spleen. Adrenals/urinary tract: Adrenal glands are normal. Again demonstrated mildly complex cystic lesion upper pole of the LEFT kidney. Lesion measures 1.8 x 2.0 x 1.3 cm (volume = 2.5 cm^3)((image 7/3 and image 11/4). This compares to 2.0 x 2.1 x 1.0 cm (volume = 2.2 cm^3) on MRI 11/09/2019. Lesion has high signal intensity T2 weighted imaging and some peripheral irregularity to the wall of the cystic lesion. There is no clear post-contrast enhancement. (Series 10, 11 and 13). There is inherent T1 shortening within the lesion on the precontrast T1 weighted imaging. This indicates some internal  blood product or proteinaceous debris. Large nonenhancing cysts extending from the mid lower pole LEFT kidney is unchanged at 3.8 cm. No RIGHT renal abnormality Stomach/Bowel: Stomach and limited of the small bowel is unremarkable Vascular/Lymphatic: Abdominal aortic normal caliber. No retroperitoneal periportal lymphadenopathy. Musculoskeletal: No aggressive osseous lesion IMPRESSION: 1. Irregular cystic lesion upper pole of the LEFT kidney cannot be characterized as benign and remains a Bosniak 101F lesion. Essentially stable in size. Recommend continued follow-up with MRI in 12 months from current month per consensus criteria. 2. Benign Bosniak 1 renal lesion in the lower pole LEFT kidney. Electronically Signed   By: Suzy Bouchard M.D.   On: 05/01/2020 10:49    Recent Labs: Lab Results  Component  Value Date   WBC 3.9 04/26/2020   HGB 11.8 04/26/2020   PLT 278 04/26/2020   NA 138 04/26/2020   K 4.4 04/26/2020   CL 103 04/26/2020   CO2 28 04/26/2020   GLUCOSE 92 04/26/2020   BUN 15 04/26/2020   CREATININE 0.87 04/26/2020   BILITOT 0.5 04/26/2020   ALKPHOS 95 02/04/2018   AST 20 04/26/2020   ALT 14 04/26/2020   PROT 6.8 04/26/2020   ALBUMIN 3.7 02/04/2018   CALCIUM 9.3 04/26/2020   GFRAA 83 04/26/2020   QFTBGOLDPLUS NEGATIVE 12/07/2019    Speciality Comments: No specialty comments available.  Procedures:  No procedures performed Allergies: Penicillins and Sulfa antibiotics   Assessment / Plan:     Visit Diagnoses: Psoriatic arthritis ([facility])-patient is clinically doing well with no synovitis on examination.  Should reduce her methotrexate dose to 3-1/2 tablet p.o. weekly.  Have advised her not to cut tablets further.  Will reduce dose of methotrexate to 3 tablets p.o. weekly.  She will continue Enbrel at the current dosage.  Other psoriasis-she has no active psoriasis lesions.  High risk medication use - Enbrel 50 mg every 7 days, methotrexate 2.5 mg 31/2 tablets every 7 days, folic acid 1 mg 1 tablet daily.  Her labs are stable.  She states her GFR was slightly low and was seen by her nephrologist.  Contracture of elbow joint, right-unchanged.  Age-related osteoporosis without current pathological fracture - DEXA on 12/20/17 AP spine BMD 0.757 with T-score -3.6. She is currently taking Fosamax 70 mg 1 tablet by mouth once weekly.  I will schedule DEXA scan.  Vitamin D deficiency-she is on supplement.  Essential hypertension-patient was recently diagnosed with hypertension.  She was placed on lisinopril.  Her blood pressure is better controlled now.  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  She is seen by nephrologist now.  Grieving-patient lost her father recently.  She is going through grieving process.  Orders: Orders Placed  This Encounter  Procedures   DG BONE DENSITY (DXA)   Meds ordered this encounter  Medications   methotrexate (RHEUMATREX) 2.5 MG tablet    Sig: Methotrexate 3 tablets p.o. weekly.  Caution:Chemotherapy. Protect from light.    Dispense:  36 tablet    Refill:  0      Follow-Up Instructions: Return in about 5 months (around 10/13/2020) for Rheumatoid arthritis, Osteoporosis.   Bo Merino, MD  Note - This record has been created using Editor, commissioning.  Chart creation errors have been sought, but may not always  have been located. Such creation errors do not reflect on  the standard of medical care.

## 2020-05-13 ENCOUNTER — Other Ambulatory Visit: Payer: Self-pay

## 2020-05-13 ENCOUNTER — Encounter: Payer: Self-pay | Admitting: Rheumatology

## 2020-05-13 ENCOUNTER — Ambulatory Visit: Payer: BC Managed Care – PPO | Admitting: Rheumatology

## 2020-05-13 VITALS — BP 118/79 | HR 87 | Resp 15 | Ht 64.0 in | Wt 126.8 lb

## 2020-05-13 DIAGNOSIS — Z79899 Other long term (current) drug therapy: Secondary | ICD-10-CM

## 2020-05-13 DIAGNOSIS — F4321 Adjustment disorder with depressed mood: Secondary | ICD-10-CM

## 2020-05-13 DIAGNOSIS — M24521 Contracture, right elbow: Secondary | ICD-10-CM | POA: Diagnosis not present

## 2020-05-13 DIAGNOSIS — E559 Vitamin D deficiency, unspecified: Secondary | ICD-10-CM

## 2020-05-13 DIAGNOSIS — M810 Age-related osteoporosis without current pathological fracture: Secondary | ICD-10-CM

## 2020-05-13 DIAGNOSIS — L4050 Arthropathic psoriasis, unspecified: Secondary | ICD-10-CM | POA: Diagnosis not present

## 2020-05-13 DIAGNOSIS — N29 Other disorders of kidney and ureter in diseases classified elsewhere: Secondary | ICD-10-CM

## 2020-05-13 DIAGNOSIS — Z862 Personal history of diseases of the blood and blood-forming organs and certain disorders involving the immune mechanism: Secondary | ICD-10-CM

## 2020-05-13 DIAGNOSIS — L408 Other psoriasis: Secondary | ICD-10-CM

## 2020-05-13 DIAGNOSIS — I10 Essential (primary) hypertension: Secondary | ICD-10-CM

## 2020-05-13 MED ORDER — METHOTREXATE 2.5 MG PO TABS: ORAL_TABLET | ORAL | 0 refills | Status: DC

## 2020-05-13 NOTE — Patient Instructions (Signed)
Standing Labs We placed an order today for your standing lab work.    Please come back and get your standing labs in August and every 3 months   We have open lab daily Monday through Thursday from 8:30-12:30 PM and 1:30-4:30 PM and Friday from 8:30-12:30 PM and 1:30-4:00 PM at the office of Dr. Teddrick Mallari.   You may experience shorter wait times on Monday and Friday afternoons. The office is located at [address], [address], [place], [place] [address] No appointment is necessary.   Labs are drawn by [facility].  You may receive a bill from [facility] for your lab work.  If you wish to have your labs drawn at another location, please call the office 24 hours in advance to send orders.  If you have any questions regarding directions or hours of operation,  please call [phone].   Just as a reminder please drink plenty of water prior to coming for your lab work. Thanks!   

## 2020-05-23 ENCOUNTER — Encounter: Payer: Self-pay | Admitting: Rheumatology

## 2020-05-31 ENCOUNTER — Telehealth: Payer: Self-pay | Admitting: *Deleted

## 2020-05-31 NOTE — Telephone Encounter (Signed)
Received DEXA results from [facility].  Date of Scan: 05/23/2020 Lowest T-score and site measured:-3.0 AP total spine Significant changes in BMD and site measured (5% and above): 8% BMD: 0.763 Left Femoral Neck, 8%  BMD: 0.818 AP total spine  Current Regimen: Fosamax 70 mg once weekly (started March 2019.)  Recommendation: Continue on Fosamax. Discuss at follow up visit.

## 2020-06-06 ENCOUNTER — Other Ambulatory Visit: Payer: Self-pay | Admitting: Rheumatology

## 2020-06-06 NOTE — Telephone Encounter (Signed)
Last visit: 05/13/2020 Next Visit: 10/14/2020  Okay to refill per Dr. Estanislado Pandy

## 2020-06-07 NOTE — Telephone Encounter (Signed)
Patient advised to continue on Fosamax. Discuss at follow up visit.

## 2020-06-09 ENCOUNTER — Other Ambulatory Visit: Payer: Self-pay | Admitting: Rheumatology

## 2020-06-10 ENCOUNTER — Other Ambulatory Visit: Payer: Self-pay | Admitting: Rheumatology

## 2020-06-10 DIAGNOSIS — Z79899 Other long term (current) drug therapy: Secondary | ICD-10-CM

## 2020-06-10 NOTE — Telephone Encounter (Signed)
Last Visit: 05/13/2020 Next Visit: 10/14/2020 Labs: 04/26/2020 CMP WNL. CBC stable.   Current Dose per office note on 05/13/2020: Fosamax 70 mg 1 tablet by mouth once weekly DX: Age-related osteoporosis without current pathological fracture  Okay to refill fosamax?

## 2020-06-11 ENCOUNTER — Other Ambulatory Visit: Payer: Self-pay | Admitting: Rheumatology

## 2020-06-11 DIAGNOSIS — Z79899 Other long term (current) drug therapy: Secondary | ICD-10-CM

## 2020-07-09 ENCOUNTER — Other Ambulatory Visit: Payer: Self-pay | Admitting: Rheumatology

## 2020-07-09 DIAGNOSIS — Z9225 Personal history of immunosupression therapy: Secondary | ICD-10-CM

## 2020-07-09 NOTE — Telephone Encounter (Signed)
Last Visit: 05/13/2020 Next Visit: 10/14/2020 Labs: 04/26/2020 CMP WNL. CBC stable.  TB Gold: 12/07/2019 Neg   Current Dose per office note on: Enbrel 50 mg every 7 days Dx:  Psoriatic arthritis   Okay to refill per Dr. Estanislado Pandy

## 2020-08-06 ENCOUNTER — Other Ambulatory Visit: Payer: Self-pay | Admitting: Rheumatology

## 2020-08-06 DIAGNOSIS — Z79899 Other long term (current) drug therapy: Secondary | ICD-10-CM

## 2020-08-07 NOTE — Telephone Encounter (Signed)
Last Visit:05/13/2020 Next Visit:10/14/2020 Labs:5/28/2021CMP WNL. CBC stable.  Current Dose per office note on 05/13/2020: methotrexate 2.5 mg 3 tablets every 7 days  Left message to advise patient she is due to update labs.   Okay to refill 30 day supply MTX?

## 2020-08-08 ENCOUNTER — Other Ambulatory Visit: Payer: Self-pay

## 2020-08-08 DIAGNOSIS — Z79899 Other long term (current) drug therapy: Secondary | ICD-10-CM

## 2020-08-08 LAB — COMPLETE METABOLIC PANEL WITH GFR
AG Ratio: 1.1 (calc) (ref 1.0–2.5)
ALT: 10 U/L (ref 6–29)
AST: 18 U/L (ref 10–35)
Albumin: 3.8 g/dL (ref 3.6–5.1)
Alkaline phosphatase (APISO): 80 U/L (ref 37–153)
BUN: 14 mg/dL (ref 7–25)
CO2: 28 mmol/L (ref 20–32)
Calcium: 9.2 mg/dL (ref 8.6–10.4)
Chloride: 103 mmol/L (ref 98–110)
Creat: 0.77 mg/dL (ref 0.50–0.99)
GFR, Est African American: 96 mL/min/{1.73_m2} (ref >=60)
GFR, Est Non African American: 83 mL/min/{1.73_m2} (ref >=60)
Globulin: 3.5 g/dL (calc) (ref 1.9–3.7)
Glucose, Bld: 88 mg/dL (ref 65–99)
Potassium: 4.3 mmol/L (ref 3.5–5.3)
Sodium: 138 mmol/L (ref 135–146)
Total Bilirubin: 0.5 mg/dL (ref 0.2–1.2)
Total Protein: 7.3 g/dL (ref 6.1–8.1)

## 2020-08-08 LAB — CBC WITH DIFFERENTIAL/PLATELET
Absolute Monocytes: 254 cells/uL (ref 200–950)
Basophils Absolute: 31 cells/uL (ref 0–200)
Basophils Relative: 1 %
Eosinophils Absolute: 183 cells/uL (ref 15–500)
Eosinophils Relative: 5.9 %
HCT: 38.7 % (ref 35.0–45.0)
Hemoglobin: 11.8 g/dL (ref 11.7–15.5)
Lymphs Abs: 1097 cells/uL (ref 850–3900)
MCH: 24.4 pg — ABNORMAL LOW (ref 27.0–33.0)
MCHC: 30.5 g/dL — ABNORMAL LOW (ref 32.0–36.0)
MCV: 80.1 fL (ref 80.0–100.0)
MPV: 11.6 fL (ref 7.5–12.5)
Monocytes Relative: 8.2 %
Neutro Abs: 1535 cells/uL (ref 1500–7800)
Neutrophils Relative %: 49.5 %
Platelets: 289 10*3/uL (ref 140–400)
RBC: 4.83 10*6/uL (ref 3.80–5.10)
RDW: 15.2 % — ABNORMAL HIGH (ref 11.0–15.0)
Total Lymphocyte: 35.4 %
WBC: 3.1 10*3/uL — ABNORMAL LOW (ref 3.8–10.8)

## 2020-08-09 NOTE — Progress Notes (Signed)
CMP WNL. WBC count is low-3.1.  reviewed lab work with Dr. Estanislado Pandy. The patient has clinically been doing well on low dose MTX 3 tablets weekly and enbrel weekly injections.  Please advise the patient to discontinue MTX and continue on Enbrel as monotherapy.  Recheck CBC in 1 month.  Please advise the patient to notify us if she develops increased joint pain or joint swelling.

## 2020-08-12 ENCOUNTER — Telehealth: Payer: Self-pay | Admitting: *Deleted

## 2020-08-12 NOTE — Telephone Encounter (Signed)
Spoke with patient and advised of her lab results.

## 2020-08-12 NOTE — Telephone Encounter (Signed)
Patient returning your call [phone].

## 2020-08-28 ENCOUNTER — Other Ambulatory Visit: Payer: Self-pay | Admitting: Physician Assistant

## 2020-08-28 DIAGNOSIS — Z79899 Other long term (current) drug therapy: Secondary | ICD-10-CM

## 2020-08-28 NOTE — Telephone Encounter (Signed)
Last Visit: 05/13/2020 Next Visit: 10/14/2020 Labs: 08/08/2020 CMP WNL. WBC count is low-3.1.   Current Dose per office note 05/13/2020:  Will reduce dose of methotrexate to 3 tablets p.o. weekly DX: Psoriatic arthritis   Okay to refill MTX?

## 2020-09-02 ENCOUNTER — Other Ambulatory Visit: Payer: Self-pay | Admitting: Rheumatology

## 2020-09-02 NOTE — Telephone Encounter (Signed)
Last Visit:05/13/2020 Next Visit:10/14/2020 Labs:08/08/2020 CMP WNL. WBC count is low-3.1.   Current Dose per office note 05/13/2020: Fosamax 70 mg 1 tablet by mouth once weekly Dx: Age-related osteoporosis without current pathological fracture   Okay to refill Fosamax?

## 2020-09-30 ENCOUNTER — Other Ambulatory Visit: Payer: Self-pay | Admitting: Rheumatology

## 2020-09-30 DIAGNOSIS — Z9225 Personal history of immunosupression therapy: Secondary | ICD-10-CM

## 2020-09-30 DIAGNOSIS — Z79899 Other long term (current) drug therapy: Secondary | ICD-10-CM

## 2020-09-30 NOTE — Telephone Encounter (Signed)
Please advise the patient to return to recheck CBC.   Ok to refill enbrel.

## 2020-09-30 NOTE — Telephone Encounter (Signed)
Patient advised she is to return to recheck CBC. Patient will try to update this week or next.

## 2020-09-30 NOTE — Progress Notes (Signed)
Office Visit Note  Patient: Taylor Kirby             Date of Birth: 01/03/1956           MRN: [id number]             PCP: Harlan Stains, MD Referring: Harlan Stains, MD Visit Date: 10/14/2020 Occupation: @GUAROCC @  Subjective:  Medication monitoring  History of Present Illness: Taylor Kirby is a 64 y.o. female with history of psoriatic arthritis and osteoporosis.  She is on Enbrel 50 mg sq injections once weekly.  She discontinued MTX due to low WBC count in September 2021.   She has not noticed any increased joint pain or joint swelling since discontinuing methotrexate.  She has not had any recent psoriatic arthritis flares.  She is not experiencing any joint pain, morning stiffness, joint swelling at this time. Patient has several questions about lab work and recommended lab monitoring.  She also reports that on 08/01/20 she had her COVID-19 antibodies checked which were 480.8.  She is planning on receiving the third COVID-19 vaccine soon.  She denies any recent infections.   Activities of Daily Living:  Patient reports morning stiffness for  0 minutes.   Patient Denies nocturnal pain.  Difficulty dressing/grooming: Denies Difficulty climbing stairs: Denies Difficulty getting out of chair: Denies Difficulty using hands for taps, buttons, cutlery, and/or writing: Denies  Review of Systems  Constitutional: Negative for fatigue.  HENT: Negative for mouth sores, mouth dryness and nose dryness.   Eyes: Negative for pain, visual disturbance and dryness.  Respiratory: Negative for cough, hemoptysis, shortness of breath and difficulty breathing.   Cardiovascular: Negative for chest pain, palpitations, hypertension and swelling in legs/feet.  Gastrointestinal: Negative for blood in stool, constipation and diarrhea.  Endocrine: Negative for increased urination.  Genitourinary: Negative for painful urination.  Musculoskeletal: Negative for arthralgias, joint pain, joint swelling,  myalgias, muscle weakness, morning stiffness, muscle tenderness and myalgias.  Skin: Negative for color change, pallor, rash, hair loss, nodules/bumps, skin tightness, ulcers and sensitivity to sunlight.  Allergic/Immunologic: Negative for susceptible to infections.  Neurological: Negative for dizziness, numbness, headaches and weakness.  Hematological: Negative for swollen glands.  Psychiatric/Behavioral: Negative for depressed mood and sleep disturbance. The patient is not nervous/anxious.     PMFS History:  Patient Active Problem List   Diagnosis Date Noted  . History of anemia 03/10/2017  . 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 Anemia 10/05/2016  . Osteopenia 10/05/2016  . 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 High risk medication use 10/03/2016  . Psoriatic arthritis ([place]) 10/25/2015    Past Medical History:  Diagnosis Date  . Allergy   . Anemia   . Heart murmur   . Osteoporosis   . Rheumatoid arthritis ([place])     Family History  Problem Relation Age of Onset  . Hyperlipidemia Mother   . Hypertension Mother   . Stroke Mother   . Hyperlipidemia Father   . Cancer Father   . Heart attack Father   . Hypertension Sister   . Heart disease Maternal Grandmother   . Cancer Maternal Grandfather   . Hypertension Paternal Grandmother    Past Surgical History:  Procedure Laterality Date  . TUBAL LIGATION     Social History   Social History Narrative  . Not on file   Immunization History  Administered Date(s) Administered  . Moderna SARS-COVID-2 Vaccination 01/27/2020, 02/24/2020     Objective: Vital Signs: BP 119/80 (BP Location: Left  Arm, Patient Position: Sitting, Cuff Size: Normal)   Pulse 71   Resp 13   Ht 5\' 4"  (1.626 m)   Wt 130 lb 12.8 oz (59.3 kg)   BMI 22.45 kg/m    Physical Exam Vitals and nursing note reviewed.  Constitutional:      Appearance: She is well-developed.  HENT:     Head: Normocephalic and atraumatic.  Eyes:      Conjunctiva/sclera: Conjunctivae normal.  Pulmonary:     Effort: Pulmonary effort is normal.  Abdominal:     Palpations: Abdomen is soft.  Musculoskeletal:     Cervical back: Normal range of motion.  Skin:    General: Skin is warm and dry.     Capillary Refill: Capillary refill takes less than 2 seconds.  Neurological:     Mental Status: She is alert and oriented to person, place, and time.  Psychiatric:        Behavior: Behavior normal.      Musculoskeletal Exam: C-spine, thoracic spine, lumbar spine have good range of motion with no discomfort.  Shoulder joints, elbow joints, wrist joints have good range of motion with no discomfort or joint tenderness.  Contracture of the right elbow joint noted.  Good range of motion of both wrist joints with no tenderness or inflammation noted.  Arthritis mutilans noted, R>L.  PIP and DIP thickening noted.  Subluxation of several DIP joints.  Hip joints, knee joints, and ankle joints have good range of motion with no discomfort.  No warmth or effusion of knee joints noted.  No tenderness or swelling of ankle joints.  No tenderness along the Achilles tendon bilaterally.   CDAI Exam: CDAI Score: 0.4  Patient Global: 2 mm; Provider Global: 2 mm Swollen: 0 ; Tender: 0  Joint Exam 10/14/2020   No joint exam has been documented for this visit   There is currently no information documented on the homunculus. Go to the Rheumatology activity and complete the homunculus joint exam.  Investigation: No additional findings.  Imaging: No results found.  Recent Labs: Lab Results  Component Value Date   WBC 4.5 10/10/2020   HGB 11.4 (L) 10/10/2020   PLT 270 10/10/2020   NA 138 08/08/2020   K 4.3 08/08/2020   CL 103 08/08/2020   CO2 28 08/08/2020   GLUCOSE 88 08/08/2020   BUN 14 08/08/2020   CREATININE 0.77 08/08/2020   BILITOT 0.5 08/08/2020   ALKPHOS 95 02/04/2018   AST 18 08/08/2020   ALT 10 08/08/2020   PROT 7.3 08/08/2020   ALBUMIN 3.7  02/04/2018   CALCIUM 9.2 08/08/2020   GFRAA 96 08/08/2020   QFTBGOLDPLUS NEGATIVE 12/07/2019    Speciality Comments: No specialty comments available.  Procedures:  No procedures performed Allergies: Penicillins and Sulfa antibiotics   Assessment / Plan:     Visit Diagnoses: Psoriatic arthritis ([place]): She has no synovitis or dactylitis on exam.  She has not had any recent psoriatic arthritis flares.  She is clinically doing well number 50 mg subcutaneous injections once weekly as monotherapy.  She discontinued low-dose methotrexate in September 2021 due to leukopenia.  She has not noticed any increased joint pain or inflammation since discontinuing methotrexate.  She is not experiencing any nocturnal pain, morning stiffness, or joint inflammation at this time.  She will continue on Enbrel 50 mg subcu injections once weekly.  She was advised to notify us if she develops increased joint pain or joint swelling.  She will follow-up in the office  in 5 months.  Other psoriasis: She has no active psoriatic at this time.   High risk medication use - Enbrel 50 mg every 7 days, methotrexate 2.5 mg 3 tablets every 7 days, folic acid 1 mg 1 tablet daily.CBC and CMP drawn on 08/08/20.  CBC updated on 10/10/20: WBC count WNL-4.5.  Her next lab work will be due December and every 3 months. Standing orders for CBC and CMP remain in place. TB gold negative on 12/07/19.  Future order for TB gold will be placed today.      - Plan: QuantiFERON-TB Gold Plus She has not had any infections.  She has received both covid-19 vaccine doses and plans on receiving the 3rd dose.  She had the covid-19 antibodies checked on 08/01/20 which was 480.8.   She was encouraged to have yearly skin exams while on enbrel.   Screening for tuberculosis -Future order for TB gold placed today. Plan: QuantiFERON-TB Gold Plus  Contracture of elbow joint, right: Chronic and unchanged.  No tenderness or inflammation noted on exam.   Age-related  osteoporosis without current pathological fracture - DEXA on 12/20/17 AP spine BMD 0.757 with T-score -3.6. Updated DEXA on 05/23/20: Lumbar spine BMD 0.828 with T-score -3.0.  She is currently taking Fosamax 70 mg 1 tablet by mouth once weekly and will continue taking it as prescribed.  Recheck DEXA in June 2023.  She was also advised to continue taking calcium and vitamin D as recommended.  The importance of resistive exercises were discussed.  She has not had any recent falls or fractures.     Vitamin D deficiency: She is taking a daily vitamin D supplement.   Essential hypertension: 119/80 today in the office.   Renal calcinosis - She is seen by nephrologist  History of anemia: She has started to take an iron supplement OTC.     Orders: Orders Placed This Encounter  Procedures  . QuantiFERON-TB Gold Plus   No orders of the defined types were placed in this encounter.   Follow-Up Instructions: Return in about 5 months (around 03/14/2021) for Rheumatoid arthritis.   Ofilia Neas, PA-C  Note - This record has been created using Dragon software.  Chart creation errors have been sought, but may not always  have been located. Such creation errors do not reflect on  the standard of medical care.

## 2020-09-30 NOTE — Telephone Encounter (Signed)
Last Visit: 05/13/2020 Next Visit: 10/14/2020 Labs: 08/08/2020 CMP WNL. WBC count is low-3.1.  TB Gold: 12/07/2019 Neg   Current Dose per office note 05/13/2020:  Enbrel 50 mg every 7 days DX: Psoriatic arthritis   Okay to refill Enbrel?

## 2020-10-10 ENCOUNTER — Other Ambulatory Visit: Payer: Self-pay | Admitting: Radiology

## 2020-10-10 DIAGNOSIS — Z79899 Other long term (current) drug therapy: Secondary | ICD-10-CM

## 2020-10-10 LAB — CBC WITH DIFFERENTIAL/PLATELET
Absolute Monocytes: 414 cells/uL (ref 200–950)
Basophils Absolute: 32 cells/uL (ref 0–200)
Basophils Relative: 0.7 %
Eosinophils Absolute: 90 cells/uL (ref 15–500)
Eosinophils Relative: 2 %
HCT: 36.4 % (ref 35.0–45.0)
Hemoglobin: 11.4 g/dL — ABNORMAL LOW (ref 11.7–15.5)
Lymphs Abs: 1647 cells/uL (ref 850–3900)
MCH: 24.9 pg — ABNORMAL LOW (ref 27.0–33.0)
MCHC: 31.3 g/dL — ABNORMAL LOW (ref 32.0–36.0)
MCV: 79.5 fL — ABNORMAL LOW (ref 80.0–100.0)
MPV: 12 fL (ref 7.5–12.5)
Monocytes Relative: 9.2 %
Neutro Abs: 2318 cells/uL (ref 1500–7800)
Neutrophils Relative %: 51.5 %
Platelets: 270 10*3/uL (ref 140–400)
RBC: 4.58 10*6/uL (ref 3.80–5.10)
RDW: 14.5 % (ref 11.0–15.0)
Total Lymphocyte: 36.6 %
WBC: 4.5 10*3/uL (ref 3.8–10.8)

## 2020-10-11 ENCOUNTER — Telehealth: Payer: Self-pay | Admitting: Pharmacy Technician

## 2020-10-11 NOTE — Telephone Encounter (Signed)
Received notification from [facility] regarding a prior authorization for ENBREL. Authorization has been APPROVED from 10/11/20 to 10/11/21.   Authorization # [id number]

## 2020-10-11 NOTE — Progress Notes (Signed)
Mild anemia noted.  Please advise patient to take multivitamin with iron.

## 2020-10-14 ENCOUNTER — Other Ambulatory Visit: Payer: Self-pay

## 2020-10-14 ENCOUNTER — Ambulatory Visit: Payer: BC Managed Care – PPO | Admitting: Physician Assistant

## 2020-10-14 ENCOUNTER — Encounter: Payer: Self-pay | Admitting: Physician Assistant

## 2020-10-14 VITALS — BP 119/80 | HR 71 | Resp 13 | Ht 64.0 in | Wt 130.8 lb

## 2020-10-14 DIAGNOSIS — Z862 Personal history of diseases of the blood and blood-forming organs and certain disorders involving the immune mechanism: Secondary | ICD-10-CM

## 2020-10-14 DIAGNOSIS — Z79899 Other long term (current) drug therapy: Secondary | ICD-10-CM

## 2020-10-14 DIAGNOSIS — M24521 Contracture, right elbow: Secondary | ICD-10-CM | POA: Diagnosis not present

## 2020-10-14 DIAGNOSIS — L4050 Arthropathic psoriasis, unspecified: Secondary | ICD-10-CM

## 2020-10-14 DIAGNOSIS — M810 Age-related osteoporosis without current pathological fracture: Secondary | ICD-10-CM

## 2020-10-14 DIAGNOSIS — Z111 Encounter for screening for respiratory tuberculosis: Secondary | ICD-10-CM

## 2020-10-14 DIAGNOSIS — I10 Essential (primary) hypertension: Secondary | ICD-10-CM

## 2020-10-14 DIAGNOSIS — N29 Other disorders of kidney and ureter in diseases classified elsewhere: Secondary | ICD-10-CM

## 2020-10-14 DIAGNOSIS — L408 Other psoriasis: Secondary | ICD-10-CM | POA: Diagnosis not present

## 2020-10-14 DIAGNOSIS — E559 Vitamin D deficiency, unspecified: Secondary | ICD-10-CM

## 2020-10-14 NOTE — Patient Instructions (Signed)
Standing Labs We placed an order today for your standing lab work.   Please have your standing labs drawn in December and every 3 months   If possible, please have your labs drawn 2 weeks prior to your appointment so that the provider can discuss your results at your appointment.  We have open lab daily Monday through Thursday from 8:30-12:30 PM and 1:30-4:30 PM and Friday from 8:30-12:30 PM and 1:30-4:00 PM at the office of Dr. Bo Merino, [facility].   Please be advised, patients with office appointments requiring lab work will take precedents over walk-in lab work.  If possible, please come for your lab work on Monday and Friday afternoons, as you may experience shorter wait times. The office is located at [address] [place], [place], [place] [address] No appointment is necessary.   Labs are drawn by [facility]. Please bring your co-pay at the time of your lab draw.  You may receive a bill from [facility] for your lab work.  If you wish to have your labs drawn at another location, please call the office 24 hours in advance to send orders.  If you have any questions regarding directions or hours of operation,  please call [phone].   As a reminder, please drink plenty of water prior to coming for your lab work. Thanks!

## 2020-10-28 ENCOUNTER — Telehealth: Payer: Self-pay

## 2020-10-28 NOTE — Telephone Encounter (Signed)
Patient called stating she had an appointment with Lovena Le on Monday, 10/14/20 and she believes someone in the clinic made a copy of her COVID vaccination card.  Patient states she is not able to find her card and is requesting a return call to let her know if a copy is in the computer or if she left her card at the office.

## 2020-10-28 NOTE — Telephone Encounter (Signed)
Attempted to contact the patient and left message for patient to call the office.  

## 2020-11-19 ENCOUNTER — Ambulatory Visit: Payer: BC Managed Care – PPO | Attending: Internal Medicine

## 2020-11-19 DIAGNOSIS — Z23 Encounter for immunization: Secondary | ICD-10-CM

## 2020-11-19 NOTE — Progress Notes (Signed)
   Covid-19 Vaccination Clinic  Name:  Taylor Kirby    MRN: [id number] DOB: [birth year]  11/19/2020  [name] was observed post Covid-19 immunization for 15 minutes without incident. She was provided with Vaccine Information Sheet and instruction to access the V-Safe system.   [name] was instructed to call 911 with any severe reactions post vaccine: Marland Kitchen Difficulty breathing  . Swelling of face and throat  . A fast heartbeat  . A bad rash all over body  . Dizziness and weakness   Immunizations Administered    Name Date Dose VIS Date Route   Moderna Covid-19 Booster Vaccine 11/19/2020  4:33 PM 0.25 mL 09/18/2020 Intramuscular   Manufacturer: Moderna   Lot: [id number]   [place]: 48250-037-04

## 2020-12-04 ENCOUNTER — Other Ambulatory Visit: Payer: Self-pay | Admitting: Rheumatology

## 2020-12-04 NOTE — Telephone Encounter (Signed)
Last Visit: 10/14/2020 Next Visit: 03/17/2021 Labs: 10/10/2020, CBC Mild anemia noted. Please advise patient to take multivitamin with iron. 08/08/2020 - CMP WNL. WBC count is low-3.1  Current Dose per office note 10/14/2020: Fosamax 70 mg 1 tablet by mouth once weekly  DX: Psoriatic arthritis   Okay to refill Fosamax?

## 2021-01-10 ENCOUNTER — Other Ambulatory Visit: Payer: Self-pay | Admitting: *Deleted

## 2021-01-10 DIAGNOSIS — Z79899 Other long term (current) drug therapy: Secondary | ICD-10-CM

## 2021-01-10 DIAGNOSIS — Z111 Encounter for screening for respiratory tuberculosis: Secondary | ICD-10-CM

## 2021-01-11 LAB — COMPLETE METABOLIC PANEL WITH GFR
AG Ratio: 1 (calc) (ref 1.0–2.5)
ALT: 8 U/L (ref 6–29)
AST: 14 U/L (ref 10–35)
Albumin: 3.8 g/dL (ref 3.6–5.1)
Alkaline phosphatase (APISO): 87 U/L (ref 37–153)
BUN: 14 mg/dL (ref 7–25)
CO2: 34 mmol/L — ABNORMAL HIGH (ref 20–32)
Calcium: 9.3 mg/dL (ref 8.6–10.4)
Chloride: 102 mmol/L (ref 98–110)
Creat: 0.74 mg/dL (ref 0.50–0.99)
GFR, Est African American: 100 mL/min/{1.73_m2} (ref >=60)
GFR, Est Non African American: 86 mL/min/{1.73_m2} (ref >=60)
Globulin: 3.7 g/dL (calc) (ref 1.9–3.7)
Glucose, Bld: 93 mg/dL (ref 65–99)
Potassium: 5.1 mmol/L (ref 3.5–5.3)
Sodium: 140 mmol/L (ref 135–146)
Total Bilirubin: 0.4 mg/dL (ref 0.2–1.2)
Total Protein: 7.5 g/dL (ref 6.1–8.1)

## 2021-01-11 LAB — QUANTIFERON-TB GOLD PLUS
Mitogen-NIL: >10 IU/mL
NIL: 0.05 IU/mL
QuantiFERON-TB Gold Plus: NEGATIVE
TB1-NIL: 0 IU/mL
TB2-NIL: <0 IU/mL

## 2021-01-11 LAB — CBC WITH DIFFERENTIAL/PLATELET
Absolute Monocytes: 251 cells/uL (ref 200–950)
Basophils Absolute: 40 cells/uL (ref 0–200)
Basophils Relative: 0.9 %
Eosinophils Absolute: 150 cells/uL (ref 15–500)
Eosinophils Relative: 3.4 %
HCT: 39.6 % (ref 35.0–45.0)
Hemoglobin: 12.3 g/dL (ref 11.7–15.5)
Lymphs Abs: 1734 cells/uL (ref 850–3900)
MCH: 24.8 pg — ABNORMAL LOW (ref 27.0–33.0)
MCHC: 31.1 g/dL — ABNORMAL LOW (ref 32.0–36.0)
MCV: 79.8 fL — ABNORMAL LOW (ref 80.0–100.0)
MPV: 11.4 fL (ref 7.5–12.5)
Monocytes Relative: 5.7 %
Neutro Abs: 2226 cells/uL (ref 1500–7800)
Neutrophils Relative %: 50.6 %
Platelets: 283 10*3/uL (ref 140–400)
RBC: 4.96 10*6/uL (ref 3.80–5.10)
RDW: 14.7 % (ref 11.0–15.0)
Total Lymphocyte: 39.4 %
WBC: 4.4 10*3/uL (ref 3.8–10.8)

## 2021-01-13 NOTE — Progress Notes (Signed)
TB gold negative

## 2021-02-19 ENCOUNTER — Other Ambulatory Visit: Payer: Self-pay | Admitting: Rheumatology

## 2021-02-19 NOTE — Telephone Encounter (Signed)
Next Visit: 03/17/2021  Last Visit: 10/14/2020  Last Fill: 12/04/2020  DX: Psoriatic arthritis   Current Dose per office note 10/14/2020, Fosamax 70 mg 1 tablet by mouth once weekly   Labs: 01/10/2021, CBC and CMP are stable  Okay to refill Fosamax?

## 2021-03-03 NOTE — Progress Notes (Signed)
Office Visit Note  Patient: Taylor Kirby             Date of Birth: 1956/05/12           MRN: [id number]             PCP: Harlan Stains, MD Referring: Harlan Stains, MD Visit Date: 03/17/2021 Occupation: @GUAROCC @  Subjective:  Right ankle stiffness  History of Present Illness: NACHELLE NEGRETTE is a 65 y.o. female with a history of psoriatic arthritis, psoriasis and osteoporosis.  She states she has been off methotrexate since September 2021 due to low white cell count.  She states after stopping methotrexate she had some discomfort in her right ankle joint and some puffiness which resolved eventually.  She has some stiffness off and on in her right ankle joint.  None of the other joints are painful.  She has been taking Enbrel 50 mg subcu every week.  She denies any psoriasis lesions.  She has been tolerating Fosamax well.  Activities of Daily Living:  Patient reports morning stiffness for 0 minutes.   Patient Denies nocturnal pain.  Difficulty dressing/grooming: Denies Difficulty climbing stairs: Denies Difficulty getting out of chair: Denies Difficulty using hands for taps, buttons, cutlery, and/or writing: Denies  Review of Systems  Constitutional: Negative for fatigue.  HENT: Negative for mouth sores, mouth dryness and nose dryness.   Eyes: Positive for dryness. Negative for pain and itching.  Respiratory: Negative for shortness of breath and difficulty breathing.   Cardiovascular: Negative for chest pain and palpitations.  Gastrointestinal: Negative for blood in stool, constipation and diarrhea.  Endocrine: Negative for increased urination.  Genitourinary: Negative for difficulty urinating.  Musculoskeletal: Positive for myalgias and myalgias. Negative for arthralgias, joint pain, joint swelling, morning stiffness and muscle tenderness.  Skin: Negative for color change, rash and redness.  Allergic/Immunologic: Negative for susceptible to infections.  Neurological:  Negative for dizziness, numbness, headaches, memory loss and weakness.  Hematological: Negative for bruising/bleeding tendency.  Psychiatric/Behavioral: Negative for confusion.    PMFS History:  Patient Active Problem List   Diagnosis Date Noted  . History of anemia 03/10/2017  . 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 Anemia 10/05/2016  . Osteopenia 10/05/2016  . 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 High risk medication use 10/03/2016  . Psoriatic arthritis ([facility]) 10/25/2015    Past Medical History:  Diagnosis Date  . Allergy   . Anemia   . Heart murmur   . Osteoporosis   . Rheumatoid arthritis ([facility])     Family History  Problem Relation Age of Onset  . Hyperlipidemia Mother   . Hypertension Mother   . Stroke Mother   . Hyperlipidemia Father   . Cancer Father   . Heart attack Father   . Hypertension Sister   . Heart disease Maternal Grandmother   . Cancer Maternal Grandfather   . Hypertension Paternal Grandmother    Past Surgical History:  Procedure Laterality Date  . TUBAL LIGATION     Social History   Social History Narrative  . Not on file   Immunization History  Administered Date(s) Administered  . Moderna SARS-COV2 Booster Vaccination 11/19/2020  . Moderna Sars-Covid-2 Vaccination 01/27/2020, 02/24/2020     Objective: Vital Signs: BP 100/68 (BP Location: Left Arm, Patient Position: Sitting, Cuff Size: Normal)   Pulse 78   Resp 13   Ht 5\' 4"  (1.626 m)   Wt 129 lb 12.8 oz (58.9 kg)   BMI 22.28 kg/m  Physical Exam Vitals and nursing note reviewed.  Constitutional:      Appearance: She is well-developed.  HENT:     Head: Normocephalic and atraumatic.  Eyes:     Conjunctiva/sclera: Conjunctivae normal.  Cardiovascular:     Rate and Rhythm: Normal rate and regular rhythm.     Heart sounds: Normal heart sounds.  Pulmonary:     Effort: Pulmonary effort is normal.     Breath sounds: Normal breath sounds.  Abdominal:     General: Bowel sounds  are normal.     Palpations: Abdomen is soft.  Musculoskeletal:     Cervical back: Normal range of motion.  Lymphadenopathy:     Cervical: No cervical adenopathy.  Skin:    General: Skin is warm and dry.     Capillary Refill: Capillary refill takes less than 2 seconds.  Neurological:     Mental Status: She is alert and oriented to person, place, and time.  Psychiatric:        Behavior: Behavior normal.      Musculoskeletal Exam: C-spine was in good range of motion.  Shoulder joints were in good range of motion.  She has right elbow joint contracture which is unchanged.  She has limited range of motion in bilateral wrist joints.  She has arthritis mutilans of bilateral thumb.  No synovitis was noted.  Hip joints and knee joints in good range of motion.  She had no tenderness or synovitis over ankle joints.  There was no tenderness over MTPs or PIPs.  CDAI Exam: CDAI Score: -- Patient Global: --; Provider Global: -- Swollen: --; Tender: -- Joint Exam 03/17/2021   No joint exam has been documented for this visit   There is currently no information documented on the homunculus. Go to the Rheumatology activity and complete the homunculus joint exam.  Investigation: No additional findings.  Imaging: No results found.  Recent Labs: Lab Results  Component Value Date   WBC 4.4 01/10/2021   HGB 12.3 01/10/2021   PLT 283 01/10/2021   NA 140 01/10/2021   K 5.1 01/10/2021   CL 102 01/10/2021   CO2 34 (H) 01/10/2021   GLUCOSE 93 01/10/2021   BUN 14 01/10/2021   CREATININE 0.74 01/10/2021   BILITOT 0.4 01/10/2021   ALKPHOS 95 02/04/2018   AST 14 01/10/2021   ALT 8 01/10/2021   PROT 7.5 01/10/2021   ALBUMIN 3.7 02/04/2018   CALCIUM 9.3 01/10/2021   GFRAA 100 01/10/2021   QFTBGOLDPLUS NEGATIVE 01/10/2021    Speciality Comments: No specialty comments available.  Procedures:  No procedures performed Allergies: Penicillins and Sulfa antibiotics   Assessment / Plan:      Visit Diagnoses: Psoriatic arthritis (HCC)-she has arthritis mutilans which is very well controlled on Enbrel once a week.  She has been off methotrexate since September 2021.  She is had some stiffness in her right ankle joint but no synovitis.  I did not notice any synovitis on examination.  Other psoriasis-she has no active psoriasis lesions.  High risk medication use - Enbrel 50 mg every 7 days. (previously on MTX and folic acid).  Labs obtained on January 10, 2021 were within normal limits.  TB gold was negative.  She is fully vaccinated against COVID-19.  A fourth booster dose was discussed and the instructions were placed in the AVS.  Pneumococcal and Shingrix vaccine was also advised.  Instructions were placed in the AVS.  Contracture of elbow joint, right-unchanged.  Age-related osteoporosis without current  pathological fracture - DEXA on 05/23/20: Lumbar spine BMD 0.828 with T-score -3.0. Fosamax 70 mg 1 tablet by mouth once weekly.  She has been tolerating Fosamax well.  Vitamin D deficiency-she has been taking diet rich in calcium and vitamin D.  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  Increased risk of heart disease with psoriatic arthritis was discussed.  Instructions were placed in the AVS regarding dietary modifications and exercise.    Orders: No orders of the defined types were placed in this encounter.  No orders of the defined types were placed in this encounter.     Follow-Up Instructions: Return in about 5 months (around 08/17/2021) for Psoriatic arthritis, Osteoporosis.   Bo Merino, MD  Note - This record has been created using Editor, commissioning.  Chart creation errors have been sought, but may not always  have been located. Such creation errors do not reflect on  the standard of medical care.

## 2021-03-17 ENCOUNTER — Other Ambulatory Visit: Payer: Self-pay

## 2021-03-17 ENCOUNTER — Ambulatory Visit: Payer: BC Managed Care – PPO | Admitting: Rheumatology

## 2021-03-17 ENCOUNTER — Encounter: Payer: Self-pay | Admitting: Rheumatology

## 2021-03-17 VITALS — BP 100/68 | HR 78 | Resp 13 | Ht 64.0 in | Wt 129.8 lb

## 2021-03-17 DIAGNOSIS — M24521 Contracture, right elbow: Secondary | ICD-10-CM | POA: Diagnosis not present

## 2021-03-17 DIAGNOSIS — L408 Other psoriasis: Secondary | ICD-10-CM | POA: Diagnosis not present

## 2021-03-17 DIAGNOSIS — E559 Vitamin D deficiency, unspecified: Secondary | ICD-10-CM

## 2021-03-17 DIAGNOSIS — M810 Age-related osteoporosis without current pathological fracture: Secondary | ICD-10-CM

## 2021-03-17 DIAGNOSIS — Z862 Personal history of diseases of the blood and blood-forming organs and certain disorders involving the immune mechanism: Secondary | ICD-10-CM

## 2021-03-17 DIAGNOSIS — Z79899 Other long term (current) drug therapy: Secondary | ICD-10-CM | POA: Diagnosis not present

## 2021-03-17 DIAGNOSIS — N29 Other disorders of kidney and ureter in diseases classified elsewhere: Secondary | ICD-10-CM

## 2021-03-17 DIAGNOSIS — L4050 Arthropathic psoriasis, unspecified: Secondary | ICD-10-CM

## 2021-03-17 DIAGNOSIS — I10 Essential (primary) hypertension: Secondary | ICD-10-CM

## 2021-03-17 NOTE — Patient Instructions (Signed)
Standing Labs We placed an order today for your standing lab work.   Please have your standing labs drawn in May 2022  If possible, please have your labs drawn 2 weeks prior to your appointment so that the provider can discuss your results at your appointment.  We have open lab daily Monday through Thursday from 1:30-4:30 PM and Friday from 1:30-4:00 PM at the office of Dr. Bo Merino, [facility].   Please be advised, all patients with office appointments requiring lab work will take precedents over walk-in lab work.  If possible, please come for your lab work on Monday and Friday afternoons, as you may experience shorter wait times. The office is located at [address] [place], [place], [place] [address] No appointment is necessary.   Labs are drawn by [facility]. Please bring your co-pay at the time of your lab draw.  You may receive a bill from [facility] for your lab work.  If you wish to have your labs drawn at another location, please call the office 24 hours in advance to send orders.  If you have any questions regarding directions or hours of operation,  please call [phone].   As a reminder, please drink plenty of water prior to coming for your lab work. Thanks!   Vaccines You are taking a medication(s) that can suppress your immune system.  The following immunizations are recommended: . Flu annually . Covid-19  . Pneumonia (Pneumovax 23 and Prevnar 13 spaced at least 1 year apart) . Shingrix (after age 6)  Please check with your PCP to make sure you are up to date.  Heart Disease Prevention   Your inflammatory disease increases your risk of heart disease which includes heart attack, stroke, atrial fibrillation (irregular heartbeats), high blood pressure, heart failure and atherosclerosis (plaque in the arteries).  It is important to reduce your risk by:   . Keep blood pressure, cholesterol, and blood sugar at healthy levels   . Smoking Cessation    . Maintain a healthy weight  o BMI 20-25   . Eat a healthy diet  o Plenty of fresh fruit, vegetables, and whole grains  o Limit saturated fats, foods high in sodium, and added sugars  o DASH and Mediterranean diet   . Increase physical activity  o Recommend moderate physically activity for 150 minutes per week/ 30 minutes a day for five days a week These can be broken up into three separate ten-minute sessions during the day.   . Reduce Stress  . Meditation, slow breathing exercises, yoga, coloring books  . Dental visits twice a year

## 2021-04-03 ENCOUNTER — Telehealth: Payer: Self-pay

## 2021-04-03 NOTE — Telephone Encounter (Signed)
Patient is taking Enbrel and Fosamax. Please advise. PCP ordered the infusion.

## 2021-04-03 NOTE — Telephone Encounter (Signed)
I called patient, patient verbalized understanding. 

## 2021-04-03 NOTE — Telephone Encounter (Signed)
Patient called stating she was diagnosed with Covid on Monday.  Patient states she has referral for the infusion which has been approved, but hasn't received a call to schedule yet.  Patient states if they are not able to schedule infusion in time, she is planning to take the [place] pill.  Patient requested a return call to discuss her medications.

## 2021-04-03 NOTE — Telephone Encounter (Signed)
Please advise the patient to hold Enbrel 2-3 weeks after her symptoms have completely resolved.

## 2021-04-04 ENCOUNTER — Telehealth: Payer: Self-pay | Admitting: Nurse Practitioner

## 2021-04-04 ENCOUNTER — Other Ambulatory Visit: Payer: Self-pay | Admitting: Nurse Practitioner

## 2021-04-04 ENCOUNTER — Ambulatory Visit (INDEPENDENT_AMBULATORY_CARE_PROVIDER_SITE_OTHER): Payer: BC Managed Care – PPO

## 2021-04-04 DIAGNOSIS — L4050 Arthropathic psoriasis, unspecified: Secondary | ICD-10-CM | POA: Diagnosis not present

## 2021-04-04 DIAGNOSIS — U071 COVID-19: Secondary | ICD-10-CM

## 2021-04-04 MED ORDER — BEBTELOVIMAB 175 MG/2 ML IV (EUA)
175.0000 mg | Freq: Once | INTRAMUSCULAR | Status: AC
  Administered 2021-04-04: 175 mg via INTRAVENOUS

## 2021-04-04 MED ORDER — DIPHENHYDRAMINE HCL 50 MG/ML IJ SOLN: 50.0000 mg | Freq: Once | INTRAMUSCULAR | Status: AC | PRN

## 2021-04-04 MED ORDER — SODIUM CHLORIDE 0.9 % IV SOLN: INTRAVENOUS | Status: AC | PRN

## 2021-04-04 MED ORDER — ALBUTEROL SULFATE HFA 108 (90 BASE) MCG/ACT IN AERS: 2.0000 | INHALATION_SPRAY | Freq: Once | RESPIRATORY_TRACT | Status: AC | PRN

## 2021-04-04 MED ORDER — METHYLPREDNISOLONE SODIUM SUCC 125 MG IJ SOLR: 125.0000 mg | Freq: Once | INTRAMUSCULAR | Status: AC | PRN

## 2021-04-04 MED ORDER — EPINEPHRINE 0.3 MG/0.3ML IJ SOAJ: 0.3000 mg | Freq: Once | INTRAMUSCULAR | Status: AC | PRN

## 2021-04-04 MED ORDER — FAMOTIDINE IN NACL 20-0.9 MG/50ML-% IV SOLN: 20.0000 mg | Freq: Once | INTRAVENOUS | Status: AC | PRN

## 2021-04-04 NOTE — Progress Notes (Signed)
I connected by phone with Taylor Kirby on 04/04/2021 at 12:29 PM to discuss the potential use of a new treatment for mild to moderate COVID-19 viral infection in non-hospitalized patients.  This patient is a 65 y.o. female that meets the FDA criteria for Emergency Use Authorization of COVID monoclonal antibody bebtelovimab.  Has a (+) direct SARS-CoV-2 viral test result  Has mild or moderate COVID-19   Is NOT hospitalized due to COVID-19  Is within 10 days of symptom onset  Has at least one of the high risk factor(s) for progression to severe COVID-19 and/or hospitalization as defined in EUA.  Specific high risk criteria : Immunosuppressive Disease or Treatment and Cardiovascular disease or hypertension   I have spoken and communicated the following to the patient or parent/caregiver regarding COVID monoclonal antibody treatment:  1. FDA has authorized the emergency use for the treatment of mild to moderate COVID-19 in adults and pediatric patients with positive results of direct SARS-CoV-2 viral testing who are 98 years of age and older weighing at least 40 kg, and who are at high risk for progressing to severe COVID-19 and/or hospitalization.  2. The significant known and potential risks and benefits of COVID monoclonal antibody, and the extent to which such potential risks and benefits are unknown.  3. Information on available alternative treatments and the risks and benefits of those alternatives, including clinical trials.  4. Patients treated with COVID monoclonal antibody should continue to self-isolate and use infection control measures (e.g., wear mask, isolate, social distance, avoid sharing personal items, clean and disinfect "high touch" surfaces, and frequent handwashing) according to CDC guidelines.   5. The patient or parent/caregiver has the option to accept or refuse COVID monoclonal antibody treatment.  6. Discussion about the monoclonal antibody infusion does not  ensure treatment. The patient will be placed on a list and scheduled according to risk, symptom onset and availability. A scheduler will reach to the patient to let them know if we can accommodate their infusion or not.  After reviewing this information with the patient, the patient has agreed to receive one of the available covid 19 monoclonal antibodies and will be provided an appropriate fact sheet prior to infusion. Jobe Gibbon, NP 04/04/2021 12:29 PM

## 2021-04-04 NOTE — Progress Notes (Signed)
Diagnosis: COVID  Provider:  Marshell Garfinkel, MD  Procedure: Infusion  IV Type: Peripheral, IV Location: L Antecubital  Bebtelovimab, Dose: 175mg   Infusion Start Time: 1517  Infusion Stop Time: 1518  Post Infusion IV Care: Observation period completed and Peripheral IV Discontinued  Discharge: Condition: Good, Destination: Home . AVS provided to patient.   Performed by:  Koren Shiver, RN

## 2021-04-04 NOTE — Telephone Encounter (Signed)
Called to discuss with patient about COVID-19 symptoms and the use of one of the available treatments for those with mild to moderate Covid symptoms and at a high risk of hospitalization.  Pt appears to qualify for outpatient treatment due to co-morbid conditions and/or a member of an at-risk group in accordance with the FDA Emergency Use Authorization.    Symptom onset: 03/31/2021 Vaccinated: Yes Booster? Yes Immunocompromised? Yes Qualifiers: RA, AA, on immunosupressants NIH Criteria:   Unable to reach pt - Voicemail and Mychart message sent.   Alda Lea, NP [facility] [phone]

## 2021-04-04 NOTE — Patient Instructions (Signed)
10 Things You Can Do to Manage Your COVID-19 Symptoms at Home If you have possible or confirmed COVID-19: 1. Stay home except to get medical care. 2. Monitor your symptoms carefully. If your symptoms get worse, call your healthcare provider immediately. 3. Get rest and stay hydrated. 4. If you have a medical appointment, call the healthcare provider ahead of time and tell them that you have or may have COVID-19. 5. For medical emergencies, call 911 and notify the dispatch personnel that you have or may have COVID-19. 6. Cover your cough and sneezes with a tissue or use the inside of your elbow. 7. Wash your hands often with soap and water for at least 20 seconds or clean your hands with an alcohol-based hand sanitizer that contains at least 60% alcohol. 8. As much as possible, stay in a specific room and away from other people in your home. Also, you should use a separate bathroom, if available. If you need to be around other people in or outside of the home, wear a mask. 9. Avoid sharing personal items with other people in your household, like dishes, towels, and bedding. 10. Clean all surfaces that are touched often, like counters, tabletops, and doorknobs. Use household cleaning sprays or wipes according to the label instructions. cdc.gov/coronavirus 06/14/2020 This information is not intended to replace advice given to you by your health care provider. Make sure you discuss any questions you have with your health care provider. Document Revised: 09/30/2020 Document Reviewed: 09/30/2020 Elsevier Patient Education  2021 Elsevier Inc.  What types of side effects do monoclonal antibody drugs cause?  Common side effects  In general, the more common side effects caused by monoclonal antibody drugs include: . Allergic reactions, such as hives or itching . Flu-like signs and symptoms, including chills, fatigue, fever, and muscle aches and pains . Nausea, vomiting . Diarrhea . Skin  rashes . Low blood pressure   The CDC is recommending patients who receive monoclonal antibody treatments wait at least 90 days before being vaccinated.  Currently, there are no data on the safety and efficacy of mRNA COVID-19 vaccines in persons who received monoclonal antibodies or convalescent plasma as part of COVID-19 treatment. Based on the estimated half-life of such therapies as well as evidence suggesting that reinfection is uncommon in the 90 days after initial infection, vaccination should be deferred for at least 90 days, as a precautionary measure until additional information becomes available, to avoid interference of the antibody treatment with vaccine-induced immune responses.   If someone you know is interested in receiving treatment please have them contact their MD for a referral or visit [email or web address]    

## 2021-04-08 ENCOUNTER — Other Ambulatory Visit: Payer: Self-pay | Admitting: Physician Assistant

## 2021-04-08 DIAGNOSIS — Z9225 Personal history of immunosupression therapy: Secondary | ICD-10-CM

## 2021-04-08 NOTE — Telephone Encounter (Signed)
Next Visit: 08/18/2021  Last Visit: 03/17/2021  Last Fill: 09/30/2020  DX: Psoriatic arthritis   Current Dose per office note 03/17/2021, Enbrel 50 mg every 7 days  Labs: 01/10/2021, CBC and CMP are stable  I called patient to update lab results.  TB Gold: 01/10/2021, negative  Okay to refill Enbrel?

## 2021-04-10 ENCOUNTER — Other Ambulatory Visit: Payer: Self-pay

## 2021-04-10 DIAGNOSIS — Z79899 Other long term (current) drug therapy: Secondary | ICD-10-CM

## 2021-04-11 LAB — CBC WITH DIFFERENTIAL/PLATELET
Absolute Monocytes: 258 cells/uL (ref 200–950)
Basophils Absolute: 29 cells/uL (ref 0–200)
Basophils Relative: 0.7 %
Eosinophils Absolute: 238 cells/uL (ref 15–500)
Eosinophils Relative: 5.8 %
HCT: 39.2 % (ref 35.0–45.0)
Hemoglobin: 12.2 g/dL (ref 11.7–15.5)
Lymphs Abs: 1677 cells/uL (ref 850–3900)
MCH: 25.4 pg — ABNORMAL LOW (ref 27.0–33.0)
MCHC: 31.1 g/dL — ABNORMAL LOW (ref 32.0–36.0)
MCV: 81.5 fL (ref 80.0–100.0)
MPV: 11.4 fL (ref 7.5–12.5)
Monocytes Relative: 6.3 %
Neutro Abs: 1898 cells/uL (ref 1500–7800)
Neutrophils Relative %: 46.3 %
Platelets: 261 10*3/uL (ref 140–400)
RBC: 4.81 10*6/uL (ref 3.80–5.10)
RDW: 13.3 % (ref 11.0–15.0)
Total Lymphocyte: 40.9 %
WBC: 4.1 10*3/uL (ref 3.8–10.8)

## 2021-04-11 LAB — COMPLETE METABOLIC PANEL WITH GFR
AG Ratio: 1 (calc) (ref 1.0–2.5)
ALT: 9 U/L (ref 6–29)
AST: 16 U/L (ref 10–35)
Albumin: 3.6 g/dL (ref 3.6–5.1)
Alkaline phosphatase (APISO): 66 U/L (ref 37–153)
BUN: 15 mg/dL (ref 7–25)
CO2: 32 mmol/L (ref 20–32)
Calcium: 9.5 mg/dL (ref 8.6–10.4)
Chloride: 104 mmol/L (ref 98–110)
Creat: 0.85 mg/dL (ref 0.50–0.99)
GFR, Est African American: 85 mL/min/{1.73_m2} (ref >=60)
GFR, Est Non African American: 73 mL/min/{1.73_m2} (ref >=60)
Globulin: 3.5 g/dL (calc) (ref 1.9–3.7)
Glucose, Bld: 113 mg/dL — ABNORMAL HIGH (ref 65–99)
Potassium: 4.4 mmol/L (ref 3.5–5.3)
Sodium: 141 mmol/L (ref 135–146)
Total Bilirubin: 0.4 mg/dL (ref 0.2–1.2)
Total Protein: 7.1 g/dL (ref 6.1–8.1)

## 2021-05-16 ENCOUNTER — Other Ambulatory Visit: Payer: Self-pay | Admitting: Physician Assistant

## 2021-05-16 NOTE — Telephone Encounter (Signed)
Next Visit: 08/18/2021   Last Visit: 03/17/2021   Last Fill: 02/19/2021  DX: Age-related osteoporosis without current pathological fracture  Current Dose per office note 03/17/2021, Fosamax 70 mg 1 tablet by mouth once weekly.  Labs: 04/10/2021 Glucose is 113.  Rest of CMP WNL. MCH and MCHC are low but improving. Rest ofCBC WNL.   Okay to refill Fosamax?

## 2021-05-26 ENCOUNTER — Ambulatory Visit (HOSPITAL_COMMUNITY)
Admission: EM | Admit: 2021-05-26 | Discharge: 2021-05-26 | Disposition: A | Discharge status: Home / Self Care | Payer: BC Managed Care – PPO | Attending: Internal Medicine | Admitting: Internal Medicine

## 2021-05-26 ENCOUNTER — Other Ambulatory Visit: Payer: Self-pay

## 2021-05-26 ENCOUNTER — Encounter (HOSPITAL_COMMUNITY): Payer: Self-pay

## 2021-05-26 DIAGNOSIS — N3001 Acute cystitis with hematuria: Secondary | ICD-10-CM | POA: Diagnosis present

## 2021-05-26 DIAGNOSIS — R350 Frequency of micturition: Secondary | ICD-10-CM | POA: Diagnosis present

## 2021-05-26 DIAGNOSIS — R300 Dysuria: Secondary | ICD-10-CM

## 2021-05-26 LAB — POCT URINALYSIS DIPSTICK, ED / UC
Bilirubin Urine: NEGATIVE
Glucose, UA: NEGATIVE mg/dL
Ketones, ur: NEGATIVE mg/dL
Leukocytes,Ua: NEGATIVE
Nitrite: NEGATIVE
Protein, ur: 100 mg/dL — AB
Specific Gravity, Urine: 1.02 (ref 1.005–1.030)
Urobilinogen, UA: 0.2 mg/dL (ref 0.0–1.0)
pH: 7 (ref 5.0–8.0)

## 2021-05-26 MED ORDER — NITROFURANTOIN MONOHYD MACRO 100 MG PO CAPS: 100.0000 mg | ORAL_CAPSULE | Freq: Two times a day (BID) | ORAL | 0 refills | Status: AC

## 2021-05-26 NOTE — Discharge Instructions (Addendum)
Urine showed evidence of infection. We are treating you with Macrobid antibiotic. Be sure to take full course. Stay hydrated- urine should be pale yellow to clear. My continue azo for relief of burning while infection is being cleared.   Please return or follow up with your primary provider if symptoms not improving with treatment. Please return sooner if you have worsening of symptoms or develop fever, nausea, vomiting, abdominal pain, back pain, lightheadedness, dizziness.

## 2021-05-26 NOTE — ED Provider Notes (Addendum)
[place]    CSN: [id number] Arrival date & time: 05/26/21  0808      History   Chief Complaint Chief Complaint  Patient presents with   Urinary Frequency    HPI ENSLIE SAHOTA is a 65 y.o. female.   Patient presents with urinary frequency and "tingling sensation" when urinating for approximately 5 days. Patient also states that she feels a slight pressure in her lower abdomen at times. Denies urinary burning or low back pain. Denies fevers at home. Denies vaginal discharge, irritation, or itching. Denies pelvic pain.    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      Home Medications    Prior to Admission medications   Medication Sig Start Date End Date Taking? Authorizing Provider  nitrofurantoin, macrocrystal-monohydrate, (MACROBID) 100 MG capsule Take 1 capsule (100 mg total) by mouth 2 (two) times daily for 7 days. 05/26/21 06/02/21 Yes Odis Luster, FNP  albuterol (PROVENTIL) (2.5 MG/3ML) 0.083% nebulizer solution Take 3 mLs (2.5 mg total) by nebulization every 6 (six) hours as needed for wheezing or shortness of breath. 04/01/14   Gregor Hams, MD  alendronate (FOSAMAX) 70 MG tablet TAKE 1 TABLET BY MOUTH ONCE A WEEK WITH A FULL GLASS OF WATER ON AN EMPTY STOMACH 05/16/21   Ofilia Neas, PA-C  cetirizine (ZYRTEC) 10 MG tablet Take 10 mg by mouth as needed.     [provider]  ENBREL 50 MG/ML injection INJECT 1 SYRINGE UNDER THE SKIN  EVERY 7 DAYS. 04/08/21   Ofilia Neas, PA-C  Ferrous Sulfate (IRON PO) Take by mouth daily.    [provider]  folic acid (FOLVITE) 1 MG tablet TAKE 1 TABLET BY MOUTH EVERY DAY 06/06/20   Bo Merino, MD  lisinopril (ZESTRIL) 5 MG tablet Take 5 mg by mouth daily. 05/11/20   [provider]  Multiple Vitamins-Minerals (MULTIVITAMIN ADULT PO) Take by mouth daily.    [provider]  nystatin cream (MYCOSTATIN) as needed. 04/12/18   [provider]    Family History Family History  Problem Relation Age of Onset   Hyperlipidemia Mother    Hypertension Mother    Stroke Mother    Hyperlipidemia Father    Cancer Father    Heart attack Father    Hypertension Sister    Heart disease Maternal Grandmother    Cancer Maternal Grandfather    Hypertension Paternal Grandmother     Social History Social History   Tobacco Use   Smoking status: Never   Smokeless tobacco: Never  Vaping Use   Vaping Use: Never used  Substance Use Topics   Alcohol use: Yes    Comment: Socially    Drug use: No     Allergies   Penicillins and Sulfa antibiotics   Review of Systems Review of Systems  Genitourinary:  Positive for frequency.  Per HPI  Physical Exam Triage Vital Signs ED Triage Vitals  Enc Vitals Group     BP 05/26/21 0833 124/76     Pulse Rate 05/26/21 0832 62     Resp 05/26/21 0832 17     Temp 05/26/21 0833 97.9 F (36.6 C)     Temp Source 05/26/21 0833 Oral     SpO2 05/26/21 0832 100 %     Weight --      Height --      Head Circumference --      Peak Flow --      Pain Score 05/26/21 0831 0     Pain Loc --      Pain Edu? --      Excl. in [place]? --    No data found.  Updated Vital Signs BP 124/76   Pulse 62   Temp 97.9 F (36.6 C) (Oral)   Resp 17   SpO2 100%   Visual Acuity Right Eye Distance:   Left Eye Distance:   Bilateral Distance:    Right Eye Near:   Left Eye Near:    Bilateral Near:     Physical  Exam Constitutional:      Appearance: Normal appearance.  HENT:     Head: Normocephalic and atraumatic.  Eyes:     Extraocular Movements: Extraocular movements intact.     Conjunctiva/sclera: Conjunctivae normal.  Pulmonary:     Effort: Pulmonary effort is normal.  Abdominal:     General: Abdomen is flat.     Palpations: Abdomen is soft.  Genitourinary:    Comments: Deferred. Urine culture pending.  Skin:    General: Skin is warm and dry.  Neurological:     General: No focal deficit present.     Mental Status: She is alert and oriented to person, place, and time. Mental status is at baseline.  Psychiatric:        Mood and Affect: Mood normal.        Behavior: Behavior normal.        Thought Content: Thought content normal.        Judgment: Judgment normal.     UC Treatments / Results  Labs (all labs ordered are listed, but only abnormal results are displayed) Labs Reviewed  POCT URINALYSIS DIPSTICK, ED / UC - Abnormal; Notable for the following components:      Result Value   Hgb urine dipstick LARGE (*)    Protein, ur 100 (*)    All other components within normal limits  URINE CULTURE    EKG   Radiology No results found.  Procedures Procedures (including critical care time)  Medications Ordered in UC Medications - No data to display  Initial Impression / Assessment and Plan / UC Course  I have reviewed the triage vital signs and the nursing notes.  Pertinent labs & imaging results that were available during my care of the patient were reviewed by me and considered in my medical decision making (see chart for details).    Urinalysis not definitive for UTI but patient symptoms correlate with UTI. Will treat with antibiotic due to duration and clinical presentation of symptoms. Awaiting urine culture results. Patient was advised to follow up if symptoms do not improve or worsen. Patient was also advised to seek care at the hospital if fever develops or if  symptoms significantly worsen. Discussed strict return precautions. Patient verbalized understanding and is agreeable with plan.  Final Clinical Impressions(s) / UC Diagnoses  Final diagnoses:  Acute cystitis with hematuria  Dysuria  Urinary frequency     Discharge Instructions      Urine showed evidence of infection. We are treating you with Macrobid antibiotic. Be sure to take full course. Stay hydrated- urine should be pale yellow to clear. My continue azo for relief of burning while infection is being cleared.   Please return or follow up with your primary provider if symptoms not improving with treatment. Please return sooner if you have worsening of symptoms or develop fever, nausea, vomiting, abdominal pain, back pain, lightheadedness, dizziness.      ED Prescriptions     Medication Sig Dispense Auth. Provider   nitrofurantoin, macrocrystal-monohydrate, (MACROBID) 100 MG capsule Take 1 capsule (100 mg total) by mouth 2 (two) times daily for 7 days. 14 capsule Odis Luster, FNP      PDMP not reviewed this encounter.   Odis Luster, FNP 05/26/21 [place], [place], FNP 05/26/21 1159

## 2021-05-26 NOTE — ED Triage Notes (Signed)
Pt c/o frequency and tingling sensation when urinating X 5 days.

## 2021-05-27 LAB — URINE CULTURE: Culture: <10000 — AB

## 2021-05-28 ENCOUNTER — Other Ambulatory Visit: Payer: Self-pay | Admitting: Rheumatology

## 2021-06-23 ENCOUNTER — Other Ambulatory Visit: Payer: Self-pay | Admitting: Physician Assistant

## 2021-06-23 DIAGNOSIS — Z9225 Personal history of immunosupression therapy: Secondary | ICD-10-CM

## 2021-06-23 NOTE — Telephone Encounter (Signed)
Next Visit: 08/18/2021  Last Visit: 03/17/2021  Last Fill: 04/08/2021  [id number] arthritis  Current Dose per office note 03/17/2021: Enbrel 50 mg every 7 days  Labs: 04/10/2021, Glucose is 113.  Rest of CMP WNL. MCH and MCHC are low but improving. Rest of CBC WNL.   TB Gold: 01/10/2021, negative   Okay to refill Enbrel?

## 2021-07-03 ENCOUNTER — Other Ambulatory Visit: Payer: Self-pay | Admitting: Rheumatology

## 2021-07-03 ENCOUNTER — Other Ambulatory Visit: Payer: Self-pay

## 2021-07-03 DIAGNOSIS — Z79899 Other long term (current) drug therapy: Secondary | ICD-10-CM

## 2021-07-04 ENCOUNTER — Other Ambulatory Visit: Payer: Self-pay | Admitting: *Deleted

## 2021-07-04 DIAGNOSIS — L4050 Arthropathic psoriasis, unspecified: Secondary | ICD-10-CM

## 2021-07-04 DIAGNOSIS — Z79899 Other long term (current) drug therapy: Secondary | ICD-10-CM

## 2021-07-04 LAB — CBC WITH DIFFERENTIAL/PLATELET
Absolute Monocytes: 321 cells/uL (ref 200–950)
Basophils Absolute: 30 cells/uL (ref 0–200)
Basophils Relative: 1 %
Eosinophils Absolute: 111 cells/uL (ref 15–500)
Eosinophils Relative: 3.7 %
HCT: 43.2 % (ref 35.0–45.0)
Hemoglobin: 13.2 g/dL (ref 11.7–15.5)
Lymphs Abs: 1299 cells/uL (ref 850–3900)
MCH: 24.9 pg — ABNORMAL LOW (ref 27.0–33.0)
MCHC: 30.6 g/dL — ABNORMAL LOW (ref 32.0–36.0)
MCV: 81.5 fL (ref 80.0–100.0)
MPV: 12.3 fL (ref 7.5–12.5)
Monocytes Relative: 10.7 %
Neutro Abs: 1239 cells/uL — ABNORMAL LOW (ref 1500–7800)
Neutrophils Relative %: 41.3 %
Platelets: 255 10*3/uL (ref 140–400)
RBC: 5.3 10*6/uL — ABNORMAL HIGH (ref 3.80–5.10)
RDW: 14.5 % (ref 11.0–15.0)
Total Lymphocyte: 43.3 %
WBC: 3 10*3/uL — ABNORMAL LOW (ref 3.8–10.8)

## 2021-07-04 LAB — COMPLETE METABOLIC PANEL WITH GFR
AG Ratio: 1.1 (calc) (ref 1.0–2.5)
ALT: 10 U/L (ref 6–29)
AST: 19 U/L (ref 10–35)
Albumin: 3.9 g/dL (ref 3.6–5.1)
Alkaline phosphatase (APISO): 75 U/L (ref 37–153)
BUN: 15 mg/dL (ref 7–25)
CO2: 28 mmol/L (ref 20–32)
Calcium: 9.4 mg/dL (ref 8.6–10.4)
Chloride: 102 mmol/L (ref 98–110)
Creat: 0.86 mg/dL (ref 0.50–1.05)
Globulin: 3.6 g/dL (calc) (ref 1.9–3.7)
Glucose, Bld: 85 mg/dL (ref 65–99)
Potassium: 4.5 mmol/L (ref 3.5–5.3)
Sodium: 137 mmol/L (ref 135–146)
Total Bilirubin: 0.5 mg/dL (ref 0.2–1.2)
Total Protein: 7.5 g/dL (ref 6.1–8.1)
eGFR: 76 mL/min/{1.73_m2} (ref >=60)

## 2021-07-04 NOTE — Progress Notes (Signed)
CMP is normal.  White cell count is low.  Repeat CBC in 1 month.

## 2021-08-05 NOTE — Progress Notes (Signed)
Office Visit Note  Patient: Taylor Kirby             Date of Birth: [birth year]           MRN: [id number]             PCP: Harlan Stains, MD Referring: Harlan Stains, MD Visit Date: 08/18/2021 Occupation: '@GUAROCC'$ @  Subjective:  Discuss lab work  History of Present Illness: Taylor Kirby is a 65 y.o. female with history of psoriatic arthritis and osteoporosis.  She is on enbrel 50 mg sq injections once weekly.  She denies any signs or symptoms of a psoriatic arthritis flare recently.  She is not experiencing any increased joint pain or joint swelling at this time.  She denies any SI joint discomfort.  She denies any Achilles tinnitus or plantar fasciitis.  She is a few patches of psoriasis on her lower extremities and has been using vitamin E and tea tree oil on a daily basis. She remains on Fosamax 70 mg 1 tablet by mouth once weekly.  Her nephrologist has advised her to avoid taking a calcium and vitamin D supplement.  She denies any recent falls or fractures. She denies any recent infections.  She has not had to miss any doses of Enbrel recently.  She is planning on receiving the influenza vaccination as well as her second COVID-19 vaccine booster.  She has started to take a black elderberry supplement.      Activities of Daily Living:  Patient reports morning stiffness for 0  none .   Patient Denies nocturnal pain.  Difficulty dressing/grooming: Denies Difficulty climbing stairs: Denies Difficulty getting out of chair: Denies Difficulty using hands for taps, buttons, cutlery, and/or writing: Denies  Review of Systems  Constitutional:  Negative for fatigue.  HENT:  Negative for mouth sores, mouth dryness and nose dryness.   Eyes:  Negative for pain, visual disturbance and dryness.  Respiratory:  Negative for cough, hemoptysis, shortness of breath and difficulty breathing.   Cardiovascular:  Negative for chest pain, palpitations, hypertension and swelling in legs/feet.   Gastrointestinal:  Negative for blood in stool, constipation and diarrhea.  Endocrine: Negative for excessive thirst and increased urination.  Genitourinary:  Negative for difficulty urinating and painful urination.  Musculoskeletal:  Negative for joint pain, joint pain, joint swelling, myalgias, muscle weakness, morning stiffness, muscle tenderness and myalgias.  Skin:  Positive for rash. Negative for color change, pallor, hair loss, nodules/bumps, skin tightness, ulcers and sensitivity to sunlight.  Allergic/Immunologic: Negative for susceptible to infections.  Neurological:  Negative for dizziness, numbness, headaches and weakness.  Hematological:  Negative for bruising/bleeding tendency and swollen glands.  Psychiatric/Behavioral:  Negative for depressed mood and sleep disturbance. The patient is not nervous/anxious.    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   High risk medication use 10/03/2016   Psoriatic arthritis ([place]) 10/25/2015    Past Medical History:  Diagnosis Date   Allergy    Anemia    Chronic kidney disease    Heart murmur    Osteoporosis    Rheumatoid arthritis ([place])     Family History  Problem Relation Age of Onset   Hyperlipidemia Mother    Hypertension Mother    Stroke Mother    Hyperlipidemia Father    Cancer Father  Heart attack Father    Hypertension Sister    Heart disease Maternal Grandmother    Cancer Maternal Grandfather    Hypertension Paternal Grandmother    Past Surgical History:  Procedure Laterality Date   TUBAL LIGATION     Social History   Social History Narrative   Not on file   Immunization History  Administered Date(s) Administered   Moderna SARS-COV2 Booster Vaccination 11/19/2020   Moderna Sars-Covid-2 Vaccination 01/27/2020, 02/24/2020      Objective: Vital Signs: BP 119/75 (BP Location: Left Arm, Patient Position: Sitting, Cuff Size: Normal)   Pulse 65   Resp 15   Ht '5\' 4"'$  (1.626 m)   Wt 128 lb (58.1 kg)   BMI 21.97 kg/m    Physical Exam Vitals and nursing note reviewed.  Constitutional:      Appearance: She is well-developed.  HENT:     Head: Normocephalic and atraumatic.  Eyes:     Conjunctiva/sclera: Conjunctivae normal.  Pulmonary:     Effort: Pulmonary effort is normal.  Abdominal:     Palpations: Abdomen is soft.  Musculoskeletal:     Cervical back: Normal range of motion.  Skin:    General: Skin is warm and dry.     Capillary Refill: Capillary refill takes less than 2 seconds.     Comments: Few small patches of psoriasis on bilateral LE  Neurological:     Mental Status: She is alert and oriented to person, place, and time.  Psychiatric:        Behavior: Behavior normal.     Musculoskeletal Exam: C-spine has good range of motion with no discomfort.  Shoulder joints have good range of motion with no discomfort.  Right elbow joint contracture noted.  Left elbow joint has good range of motion with no tenderness or inflammation.  Limited range of motion of both wrist joints.  Arthritis mutilans of bilateral thumb.  No tenderness or synovitis of PIP or DIP joints noted.  Hip joints have good range of motion with no discomfort.  Knee joints have good range of motion with no warmth or effusion.  Ankle joints have good range of motion with no tenderness or inflammation.  No evidence of Achilles tendinitis or plantar fasciitis.  CDAI Exam: CDAI Score: -- Patient Global: --; Provider Global: -- Swollen: --; Tender: -- Joint Exam 08/18/2021   No joint exam has been documented for this visit   There is currently no information documented on the homunculus. Go to the Rheumatology activity and complete the homunculus joint exam.  Investigation: No additional findings.  Imaging: No results found.  Recent  Labs: Lab Results  Component Value Date   WBC 3.9 08/14/2021   HGB 13.6 08/14/2021   PLT 266 08/14/2021   NA 137 07/03/2021   K 4.5 07/03/2021   CL 102 07/03/2021   CO2 28 07/03/2021   GLUCOSE 85 07/03/2021   BUN 15 07/03/2021   CREATININE 0.86 07/03/2021   BILITOT 0.5 07/03/2021   ALKPHOS 95 02/04/2018   AST 19 07/03/2021   ALT 10 07/03/2021   PROT 7.5 07/03/2021   ALBUMIN 3.7 02/04/2018   CALCIUM 9.4 07/03/2021   GFRAA 85 04/10/2021   QFTBGOLDPLUS NEGATIVE 01/10/2021    Speciality Comments: Discontinued methotrexate September 2021 due to low WBC  Procedures:  No procedures performed Allergies: Penicillins and Sulfa antibiotics    Assessment / Plan:     Visit Diagnoses: Psoriatic arthritis ([place]): She has no synovitis or dactylitis on examination today.  She has not had any signs or symptoms of a flare recently.  She has clinically been doing well on Enbrel 50 mg sq injections once weekly as monotherapy.  She has not missed any doses of Enbrel recently.  She has no evidence of Achilles tendinitis or plantar fasciitis.  No SI joint tenderness noted.  Arthritis mutilans of both thumbs unchanged.  Right elbow joint contracture unchanged with no tenderness or inflammation.  She has a few small patches of psoriasis on bilateral lower extremities which are healing.  She has been using tea tree oil and vitamin E oil on a daily basis.  She was advised to continue on Enbrel 50 mg sq injections once weekly.  She was advised to notify us if she develops increased joint pain or joint swelling.  She will follow-up in the office in 5 months.  Other psoriasis: She has a few small patches of psoriasis on bilateral lower extremities.  She has been using vitamin E oil and tea tree oil on a daily basis and the patches have been feeling.  She will remain on Enbrel as prescribed.  High risk medication use - Enbrel 50 mg sq injections every 7 days. Previously on MTX as combination therapy-discontinued  September 2021 due to low WBC count.  CBC and CMP updated on 07/03/21.  White blood cell count was 3.0.  Absolute neutrophil count low 1,239.  CBC with differential rechecked on 08/14/2021 and white blood cell count was 3.9 at that time.  She will be due to update lab work in November and every 3 months to monitor for drug toxicity.  Standing orders for CBC and CMP remain in place.  TB gold negative on 01/10/21.   She has not had any recent infections.  We discussed the importance of holding Enbrel if she develops signs or symptoms of an infection and to resume once the infection is completely cleared.  She plans on receiving the annual influenza vaccination as well as the second COVID-19 vaccine booster.  We also discussed receiving a Shingrix vaccination in the future. She was strongly encouraged to schedule a yearly skin exam since Enbrel raises the risk for nonmelanoma skin cancer.  Contracture of elbow joint, right: Unchanged.  No tenderness or inflammation noted.   Age-related osteoporosis without current pathological fracture - DEXA on 05/23/20: Lumbar spine BMD 0.828 with T-score -3.0.  She remains on Fosamax 70 mg 1 tablet by mouth once weekly.  She takes a multivitamin with vitamin D.  According to the patient and her nephrologist advised to avoid taking any other vitamin D or calcium supplements.  She has not had any recent falls or fractures.  She will be due to update her next bone density in June 2023.  Vitamin D deficiency: She is taking a multivitamin with vitamin D.  According to the patient her nephrologist has advised to avoid taking additional vitamin D or calcium supplement.  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   Essential hypertension: Blood pressure was 119/75 today in the office.  Orders: No orders of the defined types were placed in this encounter.  No orders of the defined types were placed in this encounter.     Follow-Up Instructions: Return in about 5 months  (around 01/18/2022) for Psoriatic arthritis, Osteoporosis.   Ofilia Neas, PA-C  Note - This record has been created using Dragon software.  Chart creation errors have been sought, but may not always  have been located. Such creation errors do not reflect  on  the standard of medical care.

## 2021-08-08 ENCOUNTER — Other Ambulatory Visit: Payer: Self-pay | Admitting: Physician Assistant

## 2021-08-08 NOTE — Telephone Encounter (Signed)
Next Visit: 08/18/2021   Last Visit: 03/17/2021   Last Fill: 05/16/2021   DX: Age-related osteoporosis without current pathological fracture   Current Dose per office note 03/17/2021: Fosamax 70 mg 1 tablet by mouth once weekly.  Labs: 07/03/2021 CMP is normal.  White cell count is low.  Okay to refill Fosamax?

## 2021-08-12 ENCOUNTER — Other Ambulatory Visit: Payer: Self-pay | Admitting: Rheumatology

## 2021-08-14 ENCOUNTER — Other Ambulatory Visit: Payer: Self-pay | Admitting: *Deleted

## 2021-08-14 DIAGNOSIS — Z79899 Other long term (current) drug therapy: Secondary | ICD-10-CM

## 2021-08-14 LAB — CBC WITH DIFFERENTIAL/PLATELET
Absolute Monocytes: 293 cells/uL (ref 200–950)
Basophils Absolute: 39 cells/uL (ref 0–200)
Basophils Relative: 1 %
Eosinophils Absolute: 133 cells/uL (ref 15–500)
Eosinophils Relative: 3.4 %
HCT: 45.1 % — ABNORMAL HIGH (ref 35.0–45.0)
Hemoglobin: 13.6 g/dL (ref 11.7–15.5)
Lymphs Abs: 1732 cells/uL (ref 850–3900)
MCH: 24.5 pg — ABNORMAL LOW (ref 27.0–33.0)
MCHC: 30.2 g/dL — ABNORMAL LOW (ref 32.0–36.0)
MCV: 81.3 fL (ref 80.0–100.0)
MPV: 12.1 fL (ref 7.5–12.5)
Monocytes Relative: 7.5 %
Neutro Abs: 1704 cells/uL (ref 1500–7800)
Neutrophils Relative %: 43.7 %
Platelets: 266 10*3/uL (ref 140–400)
RBC: 5.55 10*6/uL — ABNORMAL HIGH (ref 3.80–5.10)
RDW: 13.3 % (ref 11.0–15.0)
Total Lymphocyte: 44.4 %
WBC: 3.9 10*3/uL (ref 3.8–10.8)

## 2021-08-15 NOTE — Progress Notes (Signed)
CBC is normal.

## 2021-08-18 ENCOUNTER — Other Ambulatory Visit: Payer: Self-pay

## 2021-08-18 ENCOUNTER — Ambulatory Visit: Payer: BC Managed Care – PPO | Admitting: Physician Assistant

## 2021-08-18 ENCOUNTER — Encounter: Payer: Self-pay | Admitting: Physician Assistant

## 2021-08-18 VITALS — BP 119/75 | HR 65 | Resp 15 | Ht 64.0 in | Wt 128.0 lb

## 2021-08-18 DIAGNOSIS — N29 Other disorders of kidney and ureter in diseases classified elsewhere: Secondary | ICD-10-CM

## 2021-08-18 DIAGNOSIS — M24521 Contracture, right elbow: Secondary | ICD-10-CM

## 2021-08-18 DIAGNOSIS — L4050 Arthropathic psoriasis, unspecified: Secondary | ICD-10-CM | POA: Diagnosis not present

## 2021-08-18 DIAGNOSIS — I10 Essential (primary) hypertension: Secondary | ICD-10-CM

## 2021-08-18 DIAGNOSIS — Z79899 Other long term (current) drug therapy: Secondary | ICD-10-CM

## 2021-08-18 DIAGNOSIS — E559 Vitamin D deficiency, unspecified: Secondary | ICD-10-CM

## 2021-08-18 DIAGNOSIS — M810 Age-related osteoporosis without current pathological fracture: Secondary | ICD-10-CM

## 2021-08-18 DIAGNOSIS — L408 Other psoriasis: Secondary | ICD-10-CM

## 2021-08-18 NOTE — Patient Instructions (Signed)

## 2021-09-23 ENCOUNTER — Other Ambulatory Visit: Payer: Self-pay

## 2021-09-23 ENCOUNTER — Other Ambulatory Visit: Payer: Self-pay | Admitting: Family Medicine

## 2021-09-23 ENCOUNTER — Ambulatory Visit
Admission: RE | Admit: 2021-09-23 | Discharge: 2021-09-23 | Disposition: A | Discharge status: Home / Self Care | Payer: BC Managed Care – PPO | Source: Ambulatory Visit | Attending: Family Medicine | Admitting: Family Medicine

## 2021-09-23 DIAGNOSIS — Z8616 Personal history of COVID-19: Secondary | ICD-10-CM

## 2021-09-24 ENCOUNTER — Other Ambulatory Visit: Payer: Self-pay

## 2021-09-24 DIAGNOSIS — Z9225 Personal history of immunosupression therapy: Secondary | ICD-10-CM

## 2021-09-24 MED ORDER — ENBREL 50 MG/ML ~~LOC~~ SOSY: PREFILLED_SYRINGE | SUBCUTANEOUS | 0 refills | Status: DC

## 2021-09-24 NOTE — Telephone Encounter (Signed)
Next Visit: 01/19/2022  Last Visit: 08/18/2021  Last Fill: 06/23/2021  [id number] arthritis   Current Dose per office note 08/18/2021: Enbrel 50 mg sq injections every 7 days  Labs: 07/03/2021 CMP is normal.  White cell count is low.  08/14/2021 CBC is normal.  TB Gold: 01/10/2021 Neg    Okay to refill Enbrel?

## 2021-09-24 NOTE — Telephone Encounter (Signed)
Patient called requesting prescription refill of Enbrel to be sent to [facility].

## 2021-10-01 ENCOUNTER — Telehealth: Payer: Self-pay

## 2021-10-01 NOTE — Telephone Encounter (Signed)
Received faxed PA form from [facility] regarding ENBREL. Form completed and faxed back with chart notes. Will await determination.  Phone# [phone] Fax# [phone]

## 2021-10-02 NOTE — Telephone Encounter (Signed)
Received notification from [facility] regarding a prior authorization for ENBREL. Authorization has been APPROVED from 10/01/21 to 10/01/22.   Patient must continue to fill through [facility]: [phone]  Authorization # [id number]  Knox Saliva, PharmD, MPH, BCPS Clinical Pharmacist (Rheumatology and Pulmonology)

## 2021-10-22 ENCOUNTER — Telehealth: Payer: Self-pay | Admitting: *Deleted

## 2021-10-22 NOTE — Telephone Encounter (Signed)
Labs received from:[facility]  Drawn on:10/16/2021  Reviewed by:[name], PA-C  Labs drawn: Renal function Panel, Magnesium, UA, PR/CR ratio, Vitamin D  Results:Protein/Creatinine Ratio 1005   Occult Blood 3+   Glucose 3+   Protein 2+

## 2021-10-30 ENCOUNTER — Other Ambulatory Visit: Payer: Self-pay | Admitting: *Deleted

## 2021-10-30 DIAGNOSIS — Z79899 Other long term (current) drug therapy: Secondary | ICD-10-CM

## 2021-10-31 LAB — COMPLETE METABOLIC PANEL WITH GFR
AG Ratio: 1.1 (calc) (ref 1.0–2.5)
ALT: 9 U/L (ref 6–29)
AST: 16 U/L (ref 10–35)
Albumin: 3.8 g/dL (ref 3.6–5.1)
Alkaline phosphatase (APISO): 86 U/L (ref 37–153)
BUN: 18 mg/dL (ref 7–25)
CO2: 32 mmol/L (ref 20–32)
Calcium: 9.9 mg/dL (ref 8.6–10.4)
Chloride: 102 mmol/L (ref 98–110)
Creat: 0.8 mg/dL (ref 0.50–1.05)
Globulin: 3.6 g/dL (calc) (ref 1.9–3.7)
Glucose, Bld: 111 mg/dL — ABNORMAL HIGH (ref 65–99)
Potassium: 4.6 mmol/L (ref 3.5–5.3)
Sodium: 141 mmol/L (ref 135–146)
Total Bilirubin: 0.4 mg/dL (ref 0.2–1.2)
Total Protein: 7.4 g/dL (ref 6.1–8.1)
eGFR: 83 mL/min/{1.73_m2} (ref >=60)

## 2021-10-31 LAB — CBC WITH DIFFERENTIAL/PLATELET
Absolute Monocytes: 353 cells/uL (ref 200–950)
Basophils Absolute: 42 cells/uL (ref 0–200)
Basophils Relative: 0.9 %
Eosinophils Absolute: 99 cells/uL (ref 15–500)
Eosinophils Relative: 2.1 %
HCT: 44.1 % (ref 35.0–45.0)
Hemoglobin: 13.3 g/dL (ref 11.7–15.5)
Lymphs Abs: 1612 cells/uL (ref 850–3900)
MCH: 24 pg — ABNORMAL LOW (ref 27.0–33.0)
MCHC: 30.2 g/dL — ABNORMAL LOW (ref 32.0–36.0)
MCV: 79.5 fL — ABNORMAL LOW (ref 80.0–100.0)
MPV: 12.1 fL (ref 7.5–12.5)
Monocytes Relative: 7.5 %
Neutro Abs: 2594 cells/uL (ref 1500–7800)
Neutrophils Relative %: 55.2 %
Platelets: 287 10*3/uL (ref 140–400)
RBC: 5.55 10*6/uL — ABNORMAL HIGH (ref 3.80–5.10)
RDW: 14.4 % (ref 11.0–15.0)
Total Lymphocyte: 34.3 %
WBC: 4.7 10*3/uL (ref 3.8–10.8)

## 2021-10-31 NOTE — Progress Notes (Signed)
CBC stable, CMP shows mildly elevated glucose probably not a fasting sample.

## 2021-11-07 ENCOUNTER — Other Ambulatory Visit: Payer: Self-pay | Admitting: Physician Assistant

## 2021-11-07 NOTE — Telephone Encounter (Signed)
Next Visit: 01/19/2022  Last Visit: 08/18/2021  Last Fill: 08/08/2021  DX: Age-related osteoporosis without current pathological fracture   Current Dose per office note 08/18/2021: Fosamax 70 mg 1 tablet by mouth once weekly  Labs: 10/30/2021 CBC stable, CMP shows mildly elevated glucose probably not a fasting sample.  Okay to refill Fosamax?

## 2021-12-01 ENCOUNTER — Other Ambulatory Visit: Payer: Self-pay | Admitting: Physician Assistant

## 2021-12-01 DIAGNOSIS — Z9225 Personal history of immunosupression therapy: Secondary | ICD-10-CM

## 2021-12-02 NOTE — Telephone Encounter (Signed)
Next Visit: 01/19/2022   Last Visit: 08/18/2021   Last Fill: 09/24/2021  DX: Psoriatic arthritis    Current Dose per office note 08/18/2021: Enbrel 50 mg sq injections every 7 days  Labs: 10/30/2021 CBC stable, CMP shows mildly elevated glucose probably not a fasting sample.  TB Gold: 01/10/2021 Neg   Okay to refill Enbrel?

## 2021-12-05 IMAGING — CR DG CHEST 2V
2 series · 2 of 2 positions shown · non-contrast
Comparison: X-ray chest 02/04/2018.

CLINICAL DATA: Patient complains of cough and shortness of breath
at times. History of [id number]. Non smoker.

EXAM:
CHEST - 2 VIEW

[w chest pa]
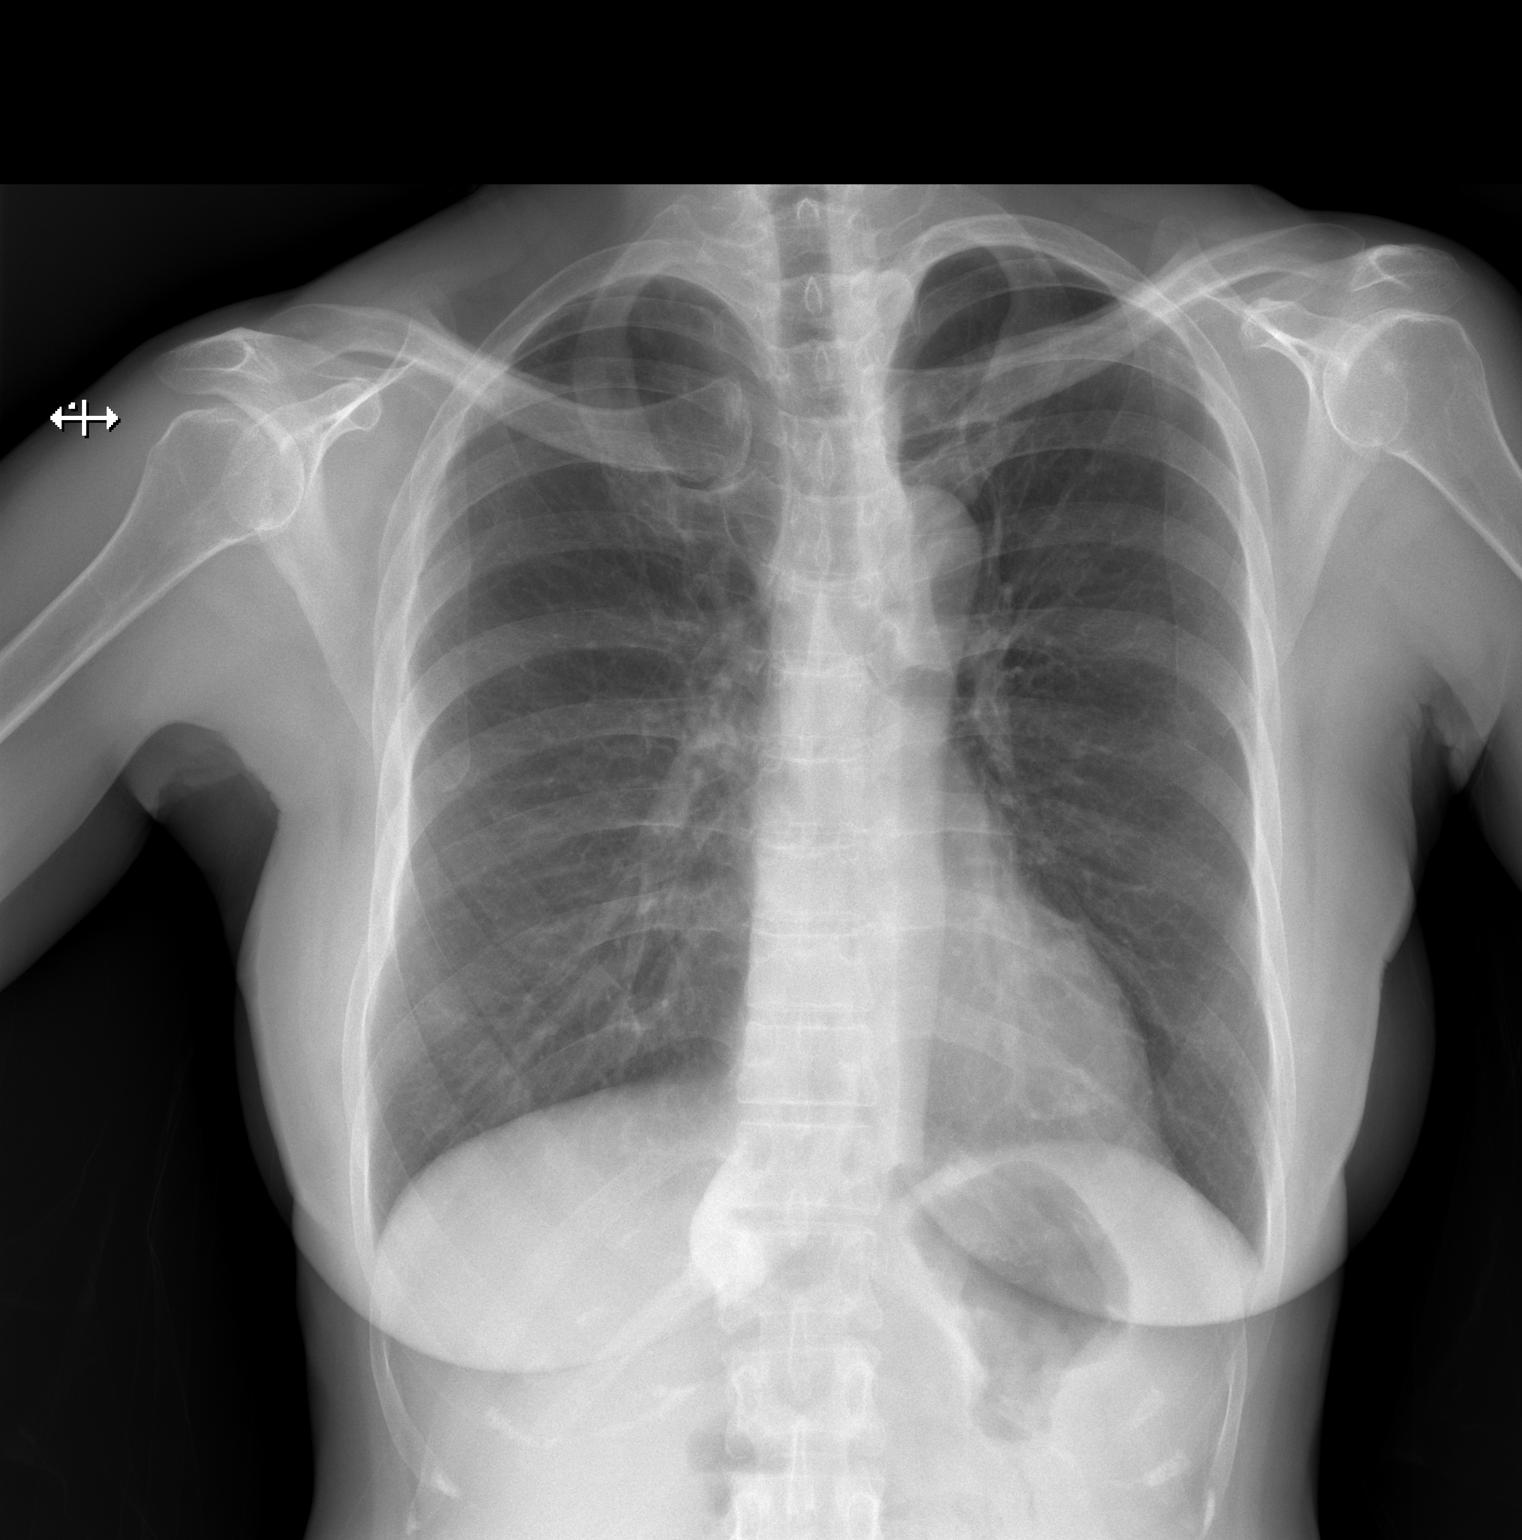

[w chest lat]
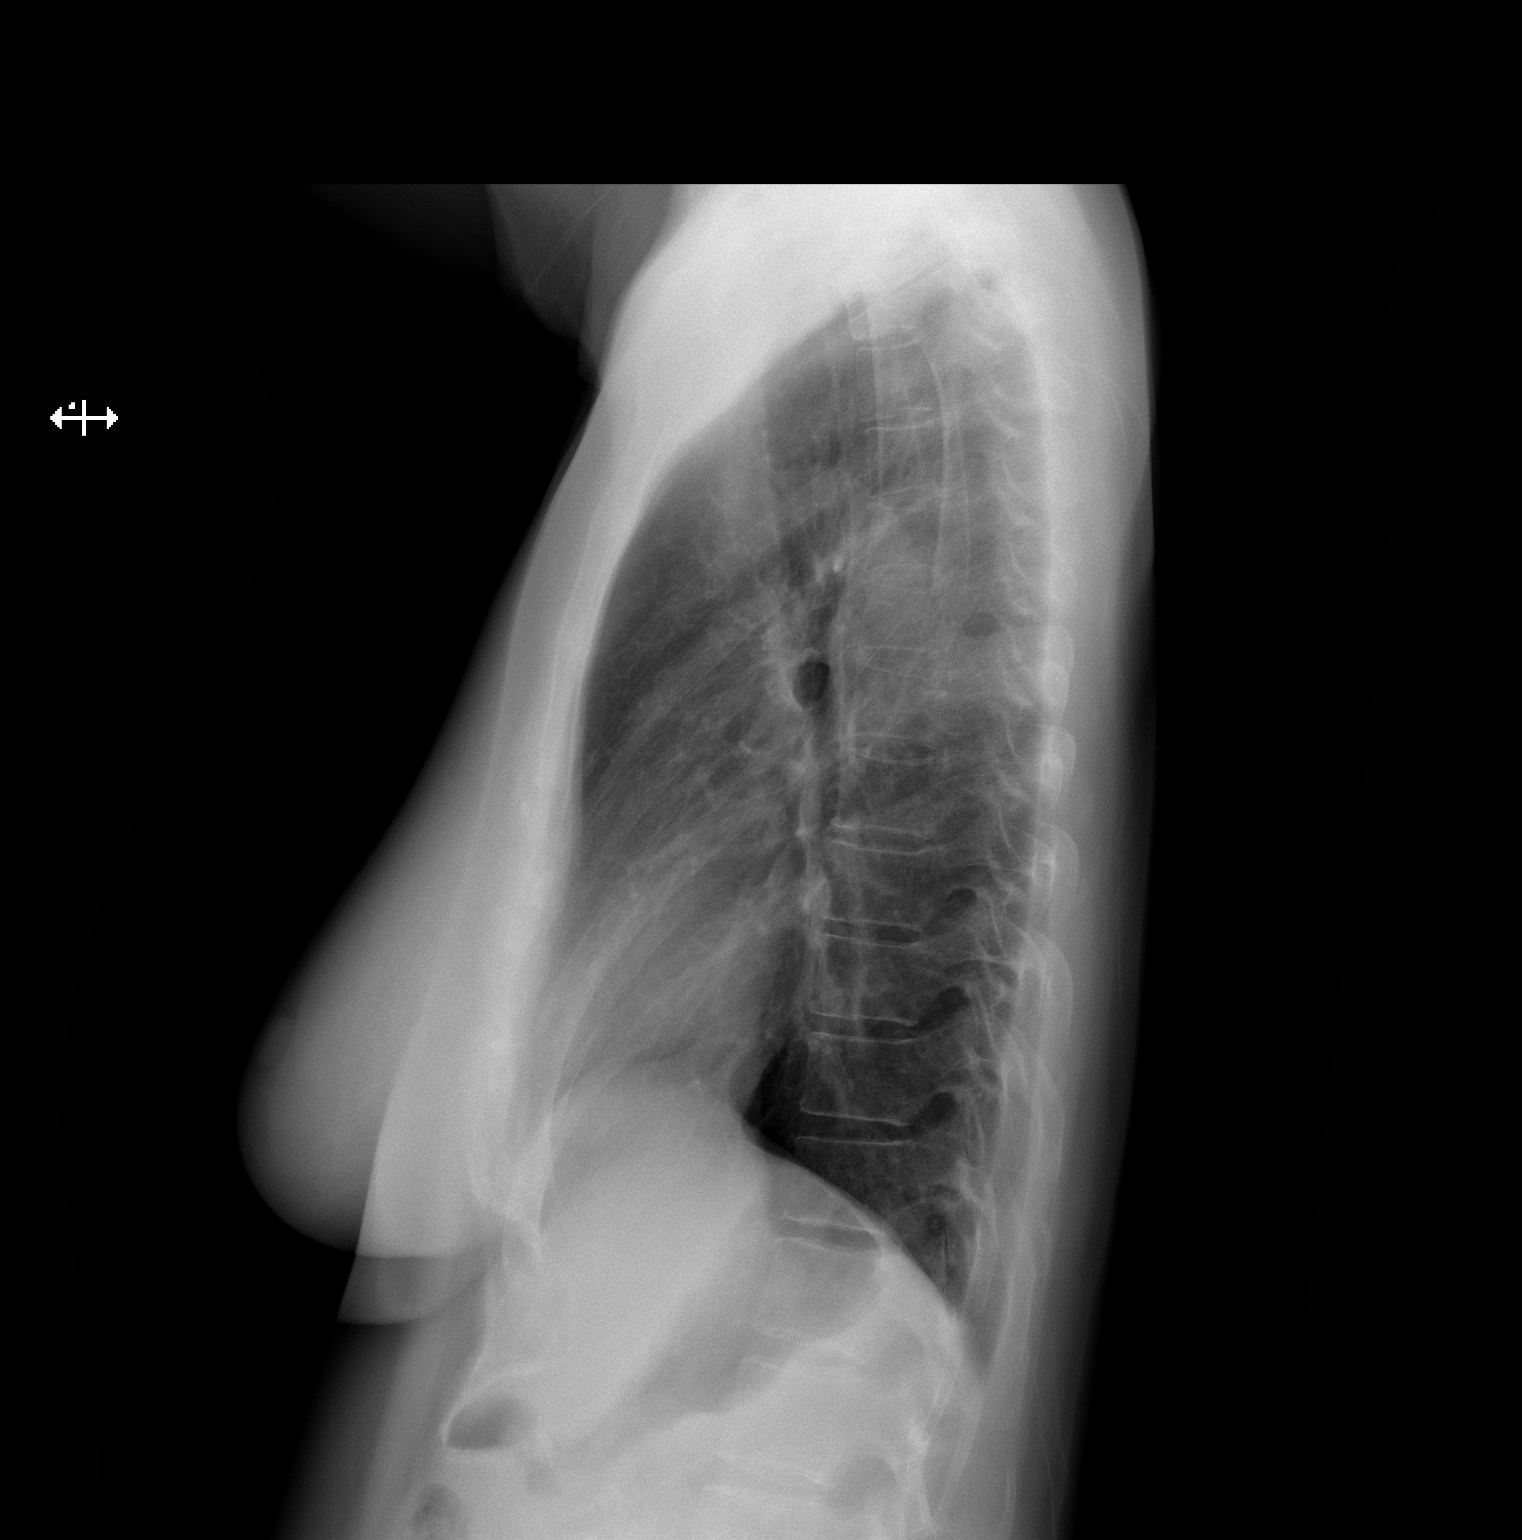

[2 of 2 positions shown; findings below may reference images not displayed]

FINDINGS: The heart size and mediastinal contours are within normal limits.
Both lungs are clear. The visualized skeletal structures are
unremarkable.
IMPRESSION: No active cardiopulmonary disease.

## 2022-01-09 NOTE — Progress Notes (Signed)
Office Visit Note  Patient: Taylor Kirby             Date of Birth: [birth year]           MRN: [id number]             PCP: Harlan Stains, MD Referring: Harlan Stains, MD Visit Date: 01/22/2022 Occupation: @GUAROCC @  Subjective:  Psoriasis   History of Present Illness: Taylor Kirby is a 66 y.o. female with history of psoriatic arthritis and osteoporosis.  Patient is currently on Enbrel 50 mg sq injections once weekly.  She has not missed any doses of Enbrel recently.  She currently has patches of psoriasis on both lower extremities.  She has been using Vaseline as well as A&D ointment as needed but has not noticed much improvement.  She has not seen a dermatologist recently.  She denies any signs or symptoms of a flare of arthritis.  She is not experiencing any joint pain, joint swelling, morning stiffness, difficulty with ADLs. She has not had any recent falls or fractures.  She remains on Fosamax 70 mg 1 tablet by mouth once weekly for management of osteoporosis.  She is tolerating fosamax without any side effects.  She denies any recent infections.    Activities of Daily Living:  Patient reports morning stiffness for 0 minutes.   Patient Denies nocturnal pain.  Difficulty dressing/grooming: Denies Difficulty climbing stairs: Denies Difficulty getting out of chair: Denies Difficulty using hands for taps, buttons, cutlery, and/or writing: Denies  Review of Systems  Constitutional:  Negative for fatigue.  HENT:  Negative for mouth sores, mouth dryness and nose dryness.   Eyes:  Positive for dryness. Negative for pain and itching.  Respiratory:  Negative for shortness of breath and difficulty breathing.   Cardiovascular:  Negative for chest pain and palpitations.  Gastrointestinal:  Negative for blood in stool, constipation and diarrhea.  Endocrine: Negative for increased urination.  Genitourinary:  Negative for difficulty urinating.  Musculoskeletal:  Negative for joint  pain, joint pain, joint swelling, myalgias, morning stiffness, muscle tenderness and myalgias.  Skin:  Negative for color change, rash and redness.  Allergic/Immunologic: Negative for susceptible to infections.  Neurological:  Negative for dizziness, numbness, headaches, memory loss and weakness.  Hematological:  Negative for bruising/bleeding tendency.  Psychiatric/Behavioral:  Negative for confusion.    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   High risk medication use 10/03/2016   Psoriatic arthritis ([place]) 10/25/2015    Past Medical History:  Diagnosis Date   Allergy    Anemia    Chronic kidney disease    Heart murmur    Osteoporosis    Rheumatoid arthritis ([place])     Family History  Problem Relation Age of Onset   Hyperlipidemia Mother    Hypertension Mother    Stroke Mother    Hyperlipidemia Father    Cancer Father    Heart attack Father    Hypertension Sister    Heart disease Maternal Grandmother    Cancer Maternal Grandfather    Hypertension Paternal Grandmother    Past Surgical History:  Procedure Laterality Date   TUBAL LIGATION     Social History   Social History Narrative   Not on file   Immunization History  Administered Date(s) Administered   Moderna SARS-COV2  Booster Vaccination 11/19/2020   Moderna Sars-Covid-2 Vaccination 01/27/2020, 02/24/2020     Objective: Vital Signs: BP 113/79 (BP Location: Left Arm, Patient Position: Sitting, Cuff Size: Normal)    Pulse 71    Ht 5\' 4"  (1.626 m)    Wt 124 lb 9.6 oz (56.5 kg)    BMI 21.39 kg/m    Physical Exam Vitals and nursing note reviewed.  Constitutional:      Appearance: She is well-developed.  HENT:     Head: Normocephalic and atraumatic.  Eyes:     Conjunctiva/sclera: Conjunctivae normal.  Cardiovascular:     Rate  and Rhythm: Normal rate and regular rhythm.     Heart sounds: Normal heart sounds.  Pulmonary:     Effort: Pulmonary effort is normal.     Breath sounds: Normal breath sounds.  Abdominal:     General: Bowel sounds are normal.     Palpations: Abdomen is soft.  Musculoskeletal:     Cervical back: Normal range of motion.  Lymphadenopathy:     Cervical: No cervical adenopathy.  Skin:    General: Skin is warm and dry.     Capillary Refill: Capillary refill takes less than 2 seconds.  Neurological:     Mental Status: She is alert and oriented to person, place, and time.  Psychiatric:        Behavior: Behavior normal.     Musculoskeletal Exam: C-spine, thoracic spine, lumbar spine have good range of motion.  No midline spinal tenderness or SI joint tenderness.  Shoulder joints have good range of motion with no discomfort.  Right elbow joint contracture noted.  No tenderness or inflammation of the right elbow joint was noted.  Left elbow joint has good range of motion with no tenderness or inflammation.  Limited range of motion of both wrist joints.  Arthritis mutilans of bilateral thumbs.  PIP and DIP thickening noted.  Hip joints have good range of motion with no groin pain.  Knee joints have good range of motion with no warmth or effusion.  Ankle joints have good range of motion with no tenderness or joint swelling.  No evidence of Achilles tendinitis.  CDAI Exam: CDAI Score: -- Patient Global: --; Provider Global: -- Swollen: --; Tender: -- Joint Exam 01/22/2022   No joint exam has been documented for this visit   There is currently no information documented on the homunculus. Go to the Rheumatology activity and complete the homunculus joint exam.  Investigation: No additional findings.  Imaging: No results found.  Recent Labs: Lab Results  Component Value Date   WBC 4.2 01/12/2022   HGB 12.9 01/12/2022   PLT 269 01/12/2022   NA 140 01/12/2022   K 4.4 01/12/2022   CL 103  01/12/2022   CO2 29 01/12/2022   GLUCOSE 88 01/12/2022   BUN 19 01/12/2022   CREATININE 0.81 01/12/2022   BILITOT 0.4 01/12/2022   ALKPHOS 95 02/04/2018   AST 16 01/12/2022   ALT 10 01/12/2022   PROT 7.4 01/12/2022   ALBUMIN 3.7 02/04/2018   CALCIUM 9.6 01/12/2022   GFRAA 85 04/10/2021   QFTBGOLDPLUS NEGATIVE 01/12/2022    Speciality Comments: Discontinued methotrexate September 2021 due to low WBC  Procedures:  No procedures performed Allergies: Penicillins and Sulfa antibiotics    Assessment / Plan:     Visit Diagnoses: Psoriatic arthritis ([place]): Hx of arthritis mutilans-Bilateral thumbs-She has no synovitis or dactylitis on examination today.  She has not had any signs or symptoms of  a psoriatic arthritis flare recently.  She is clinically doing well on Enbrel 50 mg subcutaneous injections once weekly.  She is tolerating Enbrel without any side effects and has not missed any doses recently.  She has not had any recent infections.  She is not experiencing any morning stiffness, nocturnal pain, or difficulty with ADLs.  She has no evidence of Achilles tendinitis or planter fasciitis.  She has no SI joint tenderness upon palpation.  She plans on increasing her activity level as tolerated.  She will remain on Enbrel as monotherapy.  She was advised to notify us if she develops increased joint pain or joint swelling.  She will follow-up in the office in 5 months.  Other psoriasis -She has a few to large patches of psoriasis on bilateral lower extremities.  She has been using Vaseline and A&D topically as needed with minimal improvement.  A prescription for clobetasol cream was sent to the pharmacy today.  She was advised to remain on Enbrel as prescribed.  A referral to dermatology was also placed today.  Plan: Ambulatory referral to Dermatology  High risk medication use - Enbrel 50 mg sq injections every 7 days. Previously on MTX as combination therapy-discontinued September 2021 due to  low WBC count.  CBC and CMP updated on 01/12/2022.  Her next lab work will be due in May and every 3 months to monitor for drug toxicity.  Standing orders for CBC and CMP remain in place.  TB Gold negative on 01/12/2022. She has not had any recent infections.  Discussed the importance of holding Enbrel if she develops signs or symptoms of infection and to resume once the infection has completely cleared. Discussed the importance of yearly skin examinations while on Enbrel.  A referral to dermatology was placed today.- Plan: Ambulatory referral to Dermatology  Contracture of elbow joint, right: Unchanged.  No tenderness or inflammation was noted on examination today.  Age-related osteoporosis without current pathological fracture - DEXA on 05/23/20: Lumbar spine BMD 0.828 with T-score -3.0.  She has not had any recent falls or fractures.  She remains on Fosamax 70 mg 1 tablet by mouth once weekly.  She is tolerating Fosamax without any side effects and has not missed any doses recently.  Vitamin D was 38 on 01/12/2022.  An updated order for a bone density was placed today to be updated in June 2023.  We will discuss results at her follow-up visit in July 2023.- Plan: DG BONE DENSITY (DXA)  Vitamin D deficiency -Vitamin D was 38 on 01/12/2022.  According to the patient her nephrologist has advised to avoid taking additional vitamin D or calcium supplement.  Essential hypertension: Blood pressure was 113/79 today in the office.  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   Orders: Orders Placed This Encounter  Procedures   DG BONE DENSITY (DXA)   Ambulatory referral to Dermatology   Meds ordered this encounter  Medications   clobetasol cream (TEMOVATE) 0.05 %    Sig: Apply 1 application topically 2 (two) times daily.    Dispense:  45 g    Refill:  0    Follow-Up Instructions: Return in about 5 months (around 06/21/2022) for Psoriatic arthritis, Osteoporosis.   Ofilia Neas, PA-C  Note -  This record has been created using Dragon software.  Chart creation errors have been sought, but may not always  have been located. Such creation errors do not reflect on  the standard of medical care.

## 2022-01-12 ENCOUNTER — Other Ambulatory Visit: Payer: Self-pay | Admitting: *Deleted

## 2022-01-12 DIAGNOSIS — Z9225 Personal history of immunosupression therapy: Secondary | ICD-10-CM

## 2022-01-12 DIAGNOSIS — Z111 Encounter for screening for respiratory tuberculosis: Secondary | ICD-10-CM

## 2022-01-12 DIAGNOSIS — E559 Vitamin D deficiency, unspecified: Secondary | ICD-10-CM

## 2022-01-12 DIAGNOSIS — M810 Age-related osteoporosis without current pathological fracture: Secondary | ICD-10-CM

## 2022-01-12 DIAGNOSIS — Z79899 Other long term (current) drug therapy: Secondary | ICD-10-CM

## 2022-01-13 NOTE — Progress Notes (Signed)
CBC and CMP are normal.  Vitamin D is normal.

## 2022-01-16 LAB — COMPLETE METABOLIC PANEL WITH GFR
AG Ratio: 0.9 (calc) — ABNORMAL LOW (ref 1.0–2.5)
ALT: 10 U/L (ref 6–29)
AST: 16 U/L (ref 10–35)
Albumin: 3.6 g/dL (ref 3.6–5.1)
Alkaline phosphatase (APISO): 78 U/L (ref 37–153)
BUN: 19 mg/dL (ref 7–25)
CO2: 29 mmol/L (ref 20–32)
Calcium: 9.6 mg/dL (ref 8.6–10.4)
Chloride: 103 mmol/L (ref 98–110)
Creat: 0.81 mg/dL (ref 0.50–1.05)
Globulin: 3.8 g/dL (calc) — ABNORMAL HIGH (ref 1.9–3.7)
Glucose, Bld: 88 mg/dL (ref 65–99)
Potassium: 4.4 mmol/L (ref 3.5–5.3)
Sodium: 140 mmol/L (ref 135–146)
Total Bilirubin: 0.4 mg/dL (ref 0.2–1.2)
Total Protein: 7.4 g/dL (ref 6.1–8.1)
eGFR: 81 mL/min/{1.73_m2} (ref >=60)

## 2022-01-16 LAB — CBC WITH DIFFERENTIAL/PLATELET
Absolute Monocytes: 349 cells/uL (ref 200–950)
Basophils Absolute: 29 cells/uL (ref 0–200)
Basophils Relative: 0.7 %
Eosinophils Absolute: 139 cells/uL (ref 15–500)
Eosinophils Relative: 3.3 %
HCT: 42.2 % (ref 35.0–45.0)
Hemoglobin: 12.9 g/dL (ref 11.7–15.5)
Lymphs Abs: 1399 cells/uL (ref 850–3900)
MCH: 24.2 pg — ABNORMAL LOW (ref 27.0–33.0)
MCHC: 30.6 g/dL — ABNORMAL LOW (ref 32.0–36.0)
MCV: 79.3 fL — ABNORMAL LOW (ref 80.0–100.0)
MPV: 12.4 fL (ref 7.5–12.5)
Monocytes Relative: 8.3 %
Neutro Abs: 2285 cells/uL (ref 1500–7800)
Neutrophils Relative %: 54.4 %
Platelets: 269 10*3/uL (ref 140–400)
RBC: 5.32 10*6/uL — ABNORMAL HIGH (ref 3.80–5.10)
RDW: 14.6 % (ref 11.0–15.0)
Total Lymphocyte: 33.3 %
WBC: 4.2 10*3/uL (ref 3.8–10.8)

## 2022-01-16 LAB — QUANTIFERON-TB GOLD PLUS
Mitogen-NIL: >10 IU/mL
NIL: 0.03 IU/mL
QuantiFERON-TB Gold Plus: NEGATIVE
TB1-NIL: 0 IU/mL
TB2-NIL: 0 IU/mL

## 2022-01-16 LAB — VITAMIN D 25 HYDROXY (VIT D DEFICIENCY, FRACTURES): Vit D, 25-Hydroxy: 38 ng/mL (ref 30–100)

## 2022-01-19 ENCOUNTER — Ambulatory Visit: Payer: BC Managed Care – PPO | Admitting: Rheumatology

## 2022-01-22 ENCOUNTER — Encounter: Payer: Self-pay | Admitting: Physician Assistant

## 2022-01-22 ENCOUNTER — Other Ambulatory Visit: Payer: Self-pay

## 2022-01-22 ENCOUNTER — Ambulatory Visit: Payer: BC Managed Care – PPO | Admitting: Physician Assistant

## 2022-01-22 VITALS — BP 113/79 | HR 71 | Ht 64.0 in | Wt 124.6 lb

## 2022-01-22 DIAGNOSIS — L4050 Arthropathic psoriasis, unspecified: Secondary | ICD-10-CM

## 2022-01-22 DIAGNOSIS — L408 Other psoriasis: Secondary | ICD-10-CM | POA: Diagnosis not present

## 2022-01-22 DIAGNOSIS — E559 Vitamin D deficiency, unspecified: Secondary | ICD-10-CM

## 2022-01-22 DIAGNOSIS — Z79899 Other long term (current) drug therapy: Secondary | ICD-10-CM | POA: Diagnosis not present

## 2022-01-22 DIAGNOSIS — M24521 Contracture, right elbow: Secondary | ICD-10-CM | POA: Diagnosis not present

## 2022-01-22 DIAGNOSIS — I10 Essential (primary) hypertension: Secondary | ICD-10-CM

## 2022-01-22 DIAGNOSIS — M810 Age-related osteoporosis without current pathological fracture: Secondary | ICD-10-CM

## 2022-01-22 DIAGNOSIS — N29 Other disorders of kidney and ureter in diseases classified elsewhere: Secondary | ICD-10-CM

## 2022-01-22 MED ORDER — CLOBETASOL PROPIONATE 0.05 % EX CREA: 1.0000 "application " | TOPICAL_CREAM | Freq: Two times a day (BID) | CUTANEOUS | 0 refills | Status: DC

## 2022-01-26 ENCOUNTER — Other Ambulatory Visit: Payer: Self-pay | Admitting: Physician Assistant

## 2022-01-27 NOTE — Telephone Encounter (Signed)
Next Visit: 06/24/2022  Last Visit: 01/22/2022  Last Fill: 11/07/2021  DX:  Age-related osteoporosis without current pathological fracture   Current Dose per office note 01/22/2022:  Fosamax 70 mg 1 tablet by mouth once weekly  Labs: 01/12/2022 CBC and CMP are normal.   Okay to refill Fosamax?

## 2022-01-29 ENCOUNTER — Telehealth: Payer: Self-pay | Admitting: *Deleted

## 2022-01-29 NOTE — Telephone Encounter (Addendum)
Received DEXA results from [facility]. ? ?Date of Scan: 01/28/2022 ? ?Lowest T-score:-2.0 ? ?BMD:0.831 ? ?Lowest site measured:Lumbar Spine ? ?DX: Osteopenia ? ?Significant changes in BMD and site measured (5% and above):8% Left total Femur ? ?Current Regimen:Fosamax ? ?Recommendation: Statically significant increase in BMD of left total hip. Right hip and spine stable. Calcium, Vitamin D and resistive exercises. Recommends drug holiday and repeat in 2 years.  ? ?Reviewed by: Dr. Estanislado Pandy and [name], PA-C ? ?Next Appointment:  06/24/2022 ? ?Patient advised of results and recommendations.  ?

## 2022-05-13 ENCOUNTER — Telehealth: Payer: Self-pay | Admitting: Rheumatology

## 2022-05-13 NOTE — Telephone Encounter (Signed)
Patient called the office stating her Psoriasis is flaring and is very uncomfortable. Patient states it is in both lower legs. Patient requests a call back on what to do.

## 2022-05-13 NOTE — Telephone Encounter (Signed)
I attempted to call the patient to discuss the symptoms she is currently experiencing.    Please clarify if she has been using clobetasol cream and if she has had any gaps in therapy while on enbrel?  A referral to dermatology was placed at her last office visit.  Please clarify if she has seen derm yet?

## 2022-05-13 NOTE — Telephone Encounter (Signed)
I called patient, symptoms x 1 month, redness, itching, drying, scaling, tightness, [facility], [address]

## 2022-05-15 NOTE — Telephone Encounter (Signed)
Patient advised she can use clobetasol cream twice daily for up to 2 weeks. Patient advised if her symptoms do not improve please advise her to schedule a sooner follow up visit. She should remain on enbrel as prescribed. Patient expressed understanding.

## 2022-05-15 NOTE — Telephone Encounter (Signed)
Patient states she has not been using clobetasol cream Patient states she has had a gaps in therapy while on Enbrel. It was in December for about 2 weeks.  Patient states she started seeing red spots in December. A referral to dermatology was placed at her last office visit.  Patient has not seen by dermatology and has an appointment scheduled for August 2023. Patient states she has a small tube of clobetasol cream and will start using it. She would ike to know for how long she can use the clobetasol cream. Patient states the itching has improved. Please advise.

## 2022-05-15 NOTE — Telephone Encounter (Signed)
She can use clobetasol cream twice daily for up to 2 weeks. If her symptoms do not improve please advise her to schedule a sooner follow up visit. She should remain on enbrel as prescribed.

## 2022-05-28 ENCOUNTER — Other Ambulatory Visit: Payer: Self-pay | Admitting: *Deleted

## 2022-05-28 ENCOUNTER — Other Ambulatory Visit: Payer: Self-pay | Admitting: Physician Assistant

## 2022-05-28 DIAGNOSIS — Z9225 Personal history of immunosupression therapy: Secondary | ICD-10-CM

## 2022-05-28 DIAGNOSIS — Z79899 Other long term (current) drug therapy: Secondary | ICD-10-CM

## 2022-05-28 MED ORDER — CLOBETASOL PROPIONATE 0.05 % EX CREA: 1.0000 | TOPICAL_CREAM | Freq: Two times a day (BID) | CUTANEOUS | 0 refills | Status: AC

## 2022-05-28 NOTE — Telephone Encounter (Signed)
Next Visit: 06/24/2022   Last Visit: 01/22/2022   Last Fill: 01/22/2022  Dx: Other psoriasis   Current Dose per office note on 01/22/2022: not discussed  Okay to refill Clobetasol Cream?

## 2022-05-28 NOTE — Telephone Encounter (Signed)
Next Visit: 06/24/2022   Last Visit: 01/22/2022   Last Fill: 12/02/2021  Dx: Psoriatic arthritis   Current Dose per office note 01/22/2022: Enbrel 50 mg sq injections every 7 days.  Labs: 01/12/2022 CBC and CMP are normal.   TB Gold: 01/12/2022 Neg   Patient advised she is due to update labs. Patient states she will come by the office today to update.   Okay to refill Enbrel?

## 2022-05-29 LAB — CBC WITH DIFFERENTIAL/PLATELET
Absolute Monocytes: 392 {cells}/uL (ref 200–950)
Basophils Absolute: 48 {cells}/uL (ref 0–200)
Basophils Relative: 1.1 %
Eosinophils Absolute: 110 {cells}/uL (ref 15–500)
Eosinophils Relative: 2.5 %
HCT: 43.1 % (ref 35.0–45.0)
Hemoglobin: 13.2 g/dL (ref 11.7–15.5)
Lymphs Abs: 1949 {cells}/uL (ref 850–3900)
MCH: 24.1 pg — ABNORMAL LOW (ref 27.0–33.0)
MCHC: 30.6 g/dL — ABNORMAL LOW (ref 32.0–36.0)
MCV: 78.8 fL — ABNORMAL LOW (ref 80.0–100.0)
MPV: 11.6 fL (ref 7.5–12.5)
Monocytes Relative: 8.9 %
Neutro Abs: 1901 {cells}/uL (ref 1500–7800)
Neutrophils Relative %: 43.2 %
Platelets: 242 10*3/uL (ref 140–400)
RBC: 5.47 Million/uL — ABNORMAL HIGH (ref 3.80–5.10)
RDW: 14.3 % (ref 11.0–15.0)
Total Lymphocyte: 44.3 %
WBC: 4.4 10*3/uL (ref 3.8–10.8)

## 2022-05-29 LAB — COMPLETE METABOLIC PANEL WITHOUT GFR
AG Ratio: 1.1 (calc) (ref 1.0–2.5)
ALT: 10 U/L (ref 6–29)
AST: 17 U/L (ref 10–35)
Albumin: 3.9 g/dL (ref 3.6–5.1)
Alkaline phosphatase (APISO): 77 U/L (ref 37–153)
BUN: 20 mg/dL (ref 7–25)
CO2: 30 mmol/L (ref 20–32)
Calcium: 9.6 mg/dL (ref 8.6–10.4)
Chloride: 103 mmol/L (ref 98–110)
Creat: 0.98 mg/dL (ref 0.50–1.05)
Globulin: 3.6 g/dL (ref 1.9–3.7)
Glucose, Bld: 84 mg/dL (ref 65–99)
Potassium: 5 mmol/L (ref 3.5–5.3)
Sodium: 139 mmol/L (ref 135–146)
Total Bilirubin: 0.3 mg/dL (ref 0.2–1.2)
Total Protein: 7.5 g/dL (ref 6.1–8.1)
eGFR: 64 mL/min/{1.73_m2}

## 2022-06-10 NOTE — Progress Notes (Signed)
Office Visit Note  Patient: Taylor Kirby             Date of Birth: Jun 05, 1956           MRN: [id number]             PCP: Harlan Stains, MD Referring: Harlan Stains, MD Visit Date: 06/24/2022 Occupation: '@GUAROCC'$ @  Subjective:  Medication management  History of Present Illness: Taylor Kirby is a 66 y.o. female with history of psoriatic arthritis, psoriasis and osteoporosis.  She states that on July 21 she went to visit her mother and picked up something from the floor.  She states her right knee gave out and she had difficulty getting up from the floor.  She is use Voltaren gel over the time and also applied heat which has been helpful.  Although she continues to have some discomfort in her knee joint.  None of the other joints are painful or swollen.  She states she stopped Enbrel last year for about a month and had a flare after that she resumed Enbrel.  States in February she developed psoriasis on bilateral lower extremities as it did not improve she started clobetasol topical therapy which helped the rash but she is left with postinflammatory hyperpigmentation.  Activities of Daily Living:  Patient reports morning stiffness for 45 minutes.   Patient Denies nocturnal pain.  Difficulty dressing/grooming: Denies Difficulty climbing stairs: Denies Difficulty getting out of chair: Denies Difficulty using hands for taps, buttons, cutlery, and/or writing: Denies  Review of Systems  Constitutional:  Positive for fatigue.  HENT:  Negative for mouth dryness.   Eyes:  Positive for dryness.  Respiratory:  Negative for shortness of breath.   Cardiovascular:  Negative for swelling in legs/feet.  Gastrointestinal:  Negative for constipation.  Endocrine: Negative for excessive thirst.  Genitourinary:  Negative for difficulty urinating.  Musculoskeletal:  Positive for joint pain, joint pain, joint swelling and morning stiffness.  Skin:  Negative for rash.  Allergic/Immunologic:  Negative for susceptible to infections.  Neurological:  Negative for numbness.  Hematological:  Negative for bruising/bleeding tendency.  Psychiatric/Behavioral:  Negative for sleep disturbance.     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   High risk medication use 10/03/2016   Psoriatic arthritis ([place]) 10/25/2015    Past Medical History:  Diagnosis Date   Allergy    Anemia    Chronic kidney disease    Heart murmur    Osteoporosis    Rheumatoid arthritis ([place])     Family History  Problem Relation Age of Onset   Hyperlipidemia Mother    Hypertension Mother    Stroke Mother    Hyperlipidemia Father    Cancer Father    Heart attack Father    Hypertension Sister    Heart disease Maternal Grandmother    Cancer Maternal Grandfather    Hypertension Paternal Grandmother    Past Surgical History:  Procedure Laterality Date   TUBAL LIGATION     Social History   Social History Narrative   Not on file   Immunization History  Administered Date(s) Administered   Moderna SARS-COV2 Booster Vaccination 11/19/2020   Moderna Sars-Covid-2 Vaccination 01/27/2020, 02/24/2020     Objective: Vital Signs: BP 118/78 (BP Location: Left Arm, Patient Position: Sitting, Cuff Size: Normal)  Pulse 69   Resp 15   Ht '5\' 2"'$  (1.575 m)   Wt 121 lb (54.9 kg)   BMI 22.13 kg/m    Physical Exam Vitals and nursing note reviewed.  Constitutional:      Appearance: She is well-developed.  HENT:     Head: Normocephalic and atraumatic.  Eyes:     Conjunctiva/sclera: Conjunctivae normal.  Cardiovascular:     Rate and Rhythm: Normal rate and regular rhythm.     Heart sounds: Normal heart sounds.  Pulmonary:     Effort: Pulmonary effort is normal.     Breath sounds: Normal breath sounds.  Abdominal:     General:  Bowel sounds are normal.     Palpations: Abdomen is soft.  Musculoskeletal:     Cervical back: Normal range of motion.  Lymphadenopathy:     Cervical: No cervical adenopathy.  Skin:    General: Skin is warm and dry.     Capillary Refill: Capillary refill takes less than 2 seconds.     Comments: Erythematous plaques consistent with psoriasis been noted over bilateral lower extremities.  Neurological:     Mental Status: She is alert and oriented to person, place, and time.  Psychiatric:        Behavior: Behavior normal.      Musculoskeletal Exam: Cervical, thoracic and lumbar spine were in good range of motion.  She had no tenderness over SI joints.  Shoulder joints with good range of motion.  Right elbow joint contracture was unchanged without synovitis.  Left elbow joint with good range of motion.  She had limited extension and flexion of her bilateral wrist joints.  Telescoping of bilateral thumbs due to arthritis maintenance was noted.  No synovitis was noted.  PIP and DIP thickening with subluxation of some of the PIP and DIP joints was noted.  Knee joints with good range of motion.  She has warmth on palpation of her right knee joint with small effusion.  Left knee joint was in good range of motion.  Ankle joints have good range of motion.  There was no evidence of planter fasciitis or Achilles tendinitis.  CDAI Exam: CDAI Score: -- Patient Global: --; Provider Global: -- Swollen: --; Tender: -- Joint Exam 06/24/2022   No joint exam has been documented for this visit   There is currently no information documented on the homunculus. Go to the Rheumatology activity and complete the homunculus joint exam.  Investigation: No additional findings.  Imaging: No results found.  Recent Labs: Lab Results  Component Value Date   WBC 4.4 05/28/2022   HGB 13.2 05/28/2022   PLT 242 05/28/2022   NA 139 05/28/2022   K 5.0 05/28/2022   CL 103 05/28/2022   CO2 30 05/28/2022   GLUCOSE  84 05/28/2022   BUN 20 05/28/2022   CREATININE 0.98 05/28/2022   BILITOT 0.3 05/28/2022   ALKPHOS 95 02/04/2018   AST 17 05/28/2022   ALT 10 05/28/2022   PROT 7.5 05/28/2022   ALBUMIN 3.7 02/04/2018   CALCIUM 9.6 05/28/2022   GFRAA 85 04/10/2021   QFTBGOLDPLUS NEGATIVE 01/12/2022    Speciality Comments: Discontinued methotrexate September 2021 due to low WBC  Procedures:  No procedures performed Allergies: Penicillins and Sulfa antibiotics   Assessment / Plan:     Visit Diagnoses: Psoriatic arthritis ([place]) - Hx of arthritis mutilans-Bilateral thumbs.  Patient has been on Enbrel for many years.  She has been taking Enbrel 50 mg subcu weekly.  She  discontinued methotrexate in September 2021 due to neutropenia.  She also decided to come off Enbrel for a month last year but resumed Enbrel due to a flare.  She had recent flare of psoriasis on her bilateral lower extremities which is left her postinflammatory hyperpigmentation.  She also had some active psoriasis today.  She had warmth and swelling in her right knee joint.  I offered cortisone injection but she declined.  We had a detailed discussion regarding switching her treatment regimen to [place].  Indications CONTRAINDICATIONS of Taltz were discussed at length.  Patient was in agreement to proceed.  We will apply for Taltz.  Handout was given and consent was taken.  Medication counseling:   Does patient have a history of inflammatory bowel disease? No  Counseled patient that Donnetta Hail is a IL-17 inhibitor that works to reduce pain and inflammation associated with arthritis.  Counseled patient on purpose, proper use, and adverse effects of Taltz. Reviewed the most common adverse effects of infection, inflammatory bowel disease, and allergic reaction. Counseled patient that Donnetta Hail should be held for infection and prior to scheduled surgery.  Counseled patient to avoid live vaccines while on Taltz.  Advised patient to get annual influenza vaccine,  pneumococcal vaccine, and Shingrix as indicated.  Reviewed storage information for Taltz.  Reviewed the importance of regular labs while on [place]. Standing orders placed and is to return in 1 month and then every 3 months after initiation.  Provided patient with medication education material and answered all questions.  Patient consented to [place].  Will upload consent into patient's chart.  Will apply for Taltz through patient's insurance and update when we receive a response.  Advised initial injection must be administered in office.  Patient voiced understanding.    Taltz dose will be: For psoriatic arthritis and plaque psoriasis overlap load of 160 mg then 80 mg on weeks 2,4,6,8,10,12 then 80 mg every 28 days  Prescription will be sent to pharmacy pending lab results and insurance approval.   Other psoriasis-she is having a flare of psoriasis on bilateral lower extremities.  She has been using clobetasol which has not been very effective.  High risk medication use - Enbrel 50 mg sq injections every 7 days. Previously on MTX as combination therapy-discontinued September 2021 due to low WBC count.  Labs obtained on May 28, 2022 were reviewed.  CBC and CMP were normal.  TB Gold was negative on February 09, 2022.  Information regarding immunization was placed in the wrist.  She was also advised to stop Taltz if she develops an infection.  Contracture of elbow joint, right-unchanged.  Age-related osteoporosis without current pathological fracture - DEXA on 05/23/20: Lumbar spine BMD 0.828 with T-score -3.0. Fosamax 70 mg 1 tablet by mouth once weekly.  She had been on Fosamax since March 2019.  But she interrupted treatment in between.  Fosamax was discontinued in March 2023 after her repeat DEXA scan.  T score -2.0, BMD 0.831 in the AP spine.  Vitamin D deficiency - According to the patient her nephrologist has advised to avoid taking additional vitamin D or calcium supplement.  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   Orders: No orders of the defined types were placed in this encounter.  No orders of the defined types were placed in this encounter.    Follow-Up Instructions: Return for Psoriatic arthritis.   Bo Merino, MD  Note - This record has been created using Editor, commissioning.  Chart creation errors  have been sought, but may not always  have been located. Such creation errors do not reflect on  the standard of medical care.

## 2022-06-24 ENCOUNTER — Telehealth: Payer: Self-pay | Admitting: Pharmacist

## 2022-06-24 ENCOUNTER — Ambulatory Visit: Payer: BC Managed Care – PPO | Admitting: Rheumatology

## 2022-06-24 ENCOUNTER — Encounter: Payer: Self-pay | Admitting: Rheumatology

## 2022-06-24 VITALS — BP 118/78 | HR 69 | Resp 15 | Ht 62.0 in | Wt 121.0 lb

## 2022-06-24 DIAGNOSIS — L4050 Arthropathic psoriasis, unspecified: Secondary | ICD-10-CM | POA: Diagnosis not present

## 2022-06-24 DIAGNOSIS — Z79899 Other long term (current) drug therapy: Secondary | ICD-10-CM

## 2022-06-24 DIAGNOSIS — I10 Essential (primary) hypertension: Secondary | ICD-10-CM

## 2022-06-24 DIAGNOSIS — L408 Other psoriasis: Secondary | ICD-10-CM | POA: Diagnosis not present

## 2022-06-24 DIAGNOSIS — N29 Other disorders of kidney and ureter in diseases classified elsewhere: Secondary | ICD-10-CM

## 2022-06-24 DIAGNOSIS — M810 Age-related osteoporosis without current pathological fracture: Secondary | ICD-10-CM

## 2022-06-24 DIAGNOSIS — M24521 Contracture, right elbow: Secondary | ICD-10-CM | POA: Diagnosis not present

## 2022-06-24 DIAGNOSIS — E559 Vitamin D deficiency, unspecified: Secondary | ICD-10-CM

## 2022-06-24 NOTE — Telephone Encounter (Signed)
Please start [place].  Dose: For psoriatic arthritis and plaque psoriasis overlap load of 160 mg then 80 mg on weeks 2,4,6,8,10,12 then 80 mg every 28 days   Dx: Psoriatic arthritis (L40.5) and Psoriasis (L40.9)  Previously tried therapies: Enbrel - waning clinical response MTX - low WBC  She will plan to use last Enbrel dose on Tuesday, 06/30/22. Please submit as urgent if possible so I can try to get her started next week before I am out of office.  She is eligible for Taltz copay card once approved and is switching to Medicare in January 2024  Knox Saliva, PharmD, MPH, BCPS, CPP Clinical Pharmacist (Rheumatology and Pulmonology)

## 2022-06-24 NOTE — Patient Instructions (Signed)
Ixekizumab injection What is this medication? IXEKIZUMAB (ix e KIZ ue mab) is used to treat plaque psoriasis, psoriatic arthritis, ankylosing spondylitis, and active non-radiographic axial spondyloarthritis. This medicine may be used for other purposes; ask your health care provider or pharmacist if you have questions. COMMON BRAND NAME(S): TALTZ What should I tell my care team before I take this medication? They need to know if you have any of these conditions: immune system problems infection (especially a viral infection such as chickenpox, cold sores, or herpes) recently received or are scheduled to receive a vaccine tuberculosis, a positive skin test for tuberculosis, or have recently been in close contact with someone who has tuberculosis an unusual or allergic reaction to ixekizumab, other medicines, foods, dyes or preservatives pregnant or trying to get pregnant breast-feeding How should I use this medication? This medicine is for injection under the skin. It may be administered by a healthcare professional in a hospital or clinic setting or at home. If you get this medicine at home, you will be taught how to prepare and give this medicine. Use exactly as directed. Take your medicine at regular intervals. Do not take your medicine more often than directed. It is important that you put your used needles and syringes in a special sharps container. Do not put them in a trash can. If you do not have a sharps container, call your pharmacist or healthcare provider to get one. A special MedGuide will be given to you by the pharmacist with each prescription and refill. Be sure to read this information carefully each time. Talk to your pediatrician regarding the use of this medicine in children. While this drug may be prescribed for children as young as 6 years for selected conditions, precautions do apply. Overdosage: If you think you have taken too much of this medicine contact a poison control  center or emergency room at once. NOTE: This medicine is only for you. Do not share this medicine with others. What if I miss a dose? It is important not to miss your dose. Call your doctor of health care professional if you are unable to keep an appointment. If you give yourself the medicine and you miss a dose, take it as soon as you can. Then be sure to take your next doses on your regular schedule. Do not take double or extra doses. If you have questions about a missed injection, call your health care professional. What may interact with this medication? Do not take this medicine with any of the following medications: live virus vaccines This medicine may also interact with the following medications: inactivated vaccines This list may not describe all possible interactions. Give your health care provider a list of all the medicines, herbs, non-prescription drugs, or dietary supplements you use. Also tell them if you smoke, drink alcohol, or use illegal drugs. Some items may interact with your medicine. What should I watch for while using this medication? Tell your doctor or healthcare professional if your symptoms do not start to get better or if they get worse. You will be tested for tuberculosis (TB) before you start this medicine. If your doctor prescribes any medicine for TB, you should start taking the TB medicine before starting this medicine. Make sure to finish the full course of TB medicine. Call your doctor or healthcare professional for advice if you get a fever, chills or sore throat, or other symptoms of a cold or flu. Do not treat yourself. This drug decreases your body's ability to  fight infections. Try to avoid being around people who are sick. This medicine can decrease the response to a vaccine. If you need to get vaccinated, tell your healthcare professional if you have received this medicine within the last 6 months. Extra booster doses may be needed. Talk to your doctor to see  if a different vaccination schedule is needed. What side effects may I notice from receiving this medication? Side effects that you should report to your doctor or health care professional as soon as possible: allergic reactions like skin rash, itching or hives, swelling of the face, lips, or tongue signs and symptoms of infection like fever or chills; cough; sore throat; pain or trouble passing urine signs and symptoms of bowel problems like abdominal pain, diarrhea, blood in the stool, and weight loss white patches in the mouth or throat vaginal discharge, itching, or odor in women Side effects that usually do not require medical attention (report to your doctor or health care professional if they continue or are bothersome): nausea runny nose sinus trouble This list may not describe all possible side effects. Call your doctor for medical advice about side effects. You may report side effects to FDA at 1-800-FDA-1088. Where should I keep my medication? Keep out of the reach of children. Store the prefilled syringe or injection pen in a refrigerator between 2 to 8 degrees C (36 to 46 degrees F). Keep the syringe or the pen in the original carton until ready for use. Protect from light. Do not freeze. Do not shake. Prior to use, remove the syringe or pen from the refrigerator and use within 30 minutes. Throw away any unused medicine after the expiration date on the label. NOTE: This sheet is a summary. It may not cover all possible information. If you have questions about this medicine, talk to your doctor, pharmacist, or health care provider.  2023 Elsevier/Gold Standard (2021-10-17 00:00:00)  Standing Labs We placed an order today for your standing lab work.   Please have your standing labs drawn in 1 month after starting Taltz and then every 3 months  If possible, please have your labs drawn 2 weeks prior to your appointment so that the provider can discuss your results at your  appointment.  Please note that you may see your imaging and lab results in [place] before we have reviewed them. We may be awaiting multiple results to interpret others before contacting you. Please allow our office up to 72 hours to thoroughly review all of the results before contacting the office for clarification of your results.  We have open lab daily: Monday through Thursday from 1:30-4:30 PM and Friday from 1:30-4:00 PM at the office of Dr. Bo Merino, [facility].   Please be advised, all patients with office appointments requiring lab work will take precedent over walk-in lab work.  If possible, please come for your lab work on Monday and Friday afternoons, as you may experience shorter wait times. The office is located at [address] [place], [place], [place] [address] No appointment is necessary.   Labs are drawn by [facility]. Please bring your co-pay at the time of your lab draw.  You may receive a bill from [facility] for your lab work.  Please note if you are on Hydroxychloroquine and and an order has been placed for a Hydroxychloroquine level, you will need to have it drawn 4 hours or more after your last dose.  If you wish to have your labs drawn at another location,  please call the office 24 hours in advance to send orders.  If you have any questions regarding directions or hours of operation,  please call [phone].   As a reminder, please drink plenty of water prior to coming for your lab work. Thanks!   Vaccines You are taking a medication(s) that can suppress your immune system.  The following immunizations are recommended: Flu annually Covid-19  Td/Tdap (tetanus, diphtheria, pertussis) every 10 years Pneumonia (Prevnar 15 then Pneumovax 23 at least 1 year apart.  Alternatively, can take Prevnar 20 without needing additional dose) Shingrix: 2 doses from 4 weeks to 6 months apart  Please check with your PCP to make sure you are up to date.   If  you have signs or symptoms of an infection or start antibiotics: First, call your PCP for workup of your infection. Hold your medication through the infection, until you complete your antibiotics, and until symptoms resolve if you take the following: Injectable medication (Actemra, Benlysta, Cimzia, Cosentyx, Enbrel, Humira, Kevzara, Orencia, Remicade, Simponi, Stelara, Taltz, Tremfya) Methotrexate Leflunomide (Arava) Mycophenolate (Cellcept) Morrie Sheldon, Olumiant, or Rinvoq

## 2022-06-24 NOTE — Progress Notes (Signed)
Pharmacy Note  Subjective:  Patient presents today to [facility] for follow up office visit.  Patient was seen by the pharmacist for counseling on [place] for psoriatic arthritis and plaque psoriasis..  Prior therapy includes: methotrexate (stopped in 07/2020 due to low WBC), Enbrel (waning clinical response).  History of inflammatory bowel disease: No  Objective:  CBC    Component Value Date/Time   WBC 4.4 05/28/2022 1420   RBC 5.47 (H) 05/28/2022 1420   HGB 13.2 05/28/2022 1420   HCT 43.1 05/28/2022 1420   PLT 242 05/28/2022 1420   MCV 78.8 (L) 05/28/2022 1420   MCH 24.1 (L) 05/28/2022 1420   MCHC 30.6 (L) 05/28/2022 1420   RDW 14.3 05/28/2022 1420   LYMPHSABS 1,949 05/28/2022 1420   MONOABS 0.3 02/04/2018 1618   EOSABS 110 05/28/2022 1420   BASOSABS 48 05/28/2022 1420    CMP     Component Value Date/Time   NA 139 05/28/2022 1420   K 5.0 05/28/2022 1420   CL 103 05/28/2022 1420   CO2 30 05/28/2022 1420   GLUCOSE 84 05/28/2022 1420   BUN 20 05/28/2022 1420   CREATININE 0.98 05/28/2022 1420   CALCIUM 9.6 05/28/2022 1420   PROT 7.5 05/28/2022 1420   ALBUMIN 3.7 02/04/2018 1618   AST 17 05/28/2022 1420   ALT 10 05/28/2022 1420   ALKPHOS 95 02/04/2018 1618   BILITOT 0.3 05/28/2022 1420   GFRNONAA 73 04/10/2021 0951   GFRAA 85 04/10/2021 0951    Baseline Immunosuppressant Therapy Labs     Latest Ref Rng & Units 01/12/2022   11:33 AM  Quantiferon TB Gold  Quantiferon TB Gold Plus NEGATIVE NEGATIVE    Hepatitis and HIV - pending review of SRS records by [place], CMA     Latest Ref Rng & Units 05/28/2022    2:20 PM  Serum Protein Electrophoresis  Total Protein 6.1 - 8.1 g/dL 7.5    Chest Xray:09/23/21 - No active cardiopulmonary disease.  Assessment/Plan:  Counseled patient that Donnetta Hail is a IL-17 inhibitor that works to reduce pain and inflammation associated with arthritis.  Counseled patient on purpose, proper use, and adverse effects of Taltz.  Reviewed the most common adverse effects of infection (more commonly nasopharyngitis, URTI), inflammatory bowel disease, and allergic reaction. Counseled patient that Donnetta Hail should be held for infection and prior to scheduled surgery.  Counseled patient to avoid live vaccines while on Taltz. Recommend annual influenza, PCV 15 or PCV20 or Pneumovax 23, and Shingrix as indicated. Reviewed storage information for Taltz.  Reviewed the importance of regular labs while on [place]. Will monitor CBC and CMP 1 month after starting and every 3 months routinely thereafter. Will monitor TB gold annually. Standing orders placed. Provided patient with medication education material and answered all questions.  Patient consented to [place].  Will upload consent into patient's chart.  Will apply for Taltz through patient's insurance and update when we receive a response.  Advised initial injection must be administered in office.  Patient voiced understanding.    Taltz dose will be: For psoriatic arthritis and plaque psoriasis overlap load of 160 mg then 80 mg on weeks 2,4,6,8,10,12 then 80 mg every 28 days  Prescription will be sent to pharmacy pending lab results and insurance approval.  She will plan to take last Enbrel dose on 06/30/22 and has been advised we will try to get her started next week if possible  Knox Saliva, PharmD, MPH, BCPS, CPP Clinical Pharmacist (Rheumatology and Pulmonology)

## 2022-06-24 NOTE — Telephone Encounter (Signed)
Clinical questions submitted to [employer]. Preferred insurance formulary treatments are: Cosentyx, Enbrel, Humira, O[place]zla, Remicade, Rinvoq, Simponi [place], Skyrizi ([place]), Stelara ([place]), and [place].  Donnetta Hail will likely be denied   Knox Saliva, PharmD, MPH, BCPS, CPP Clinical Pharmacist (Rheumatology and Pulmonology)

## 2022-06-24 NOTE — Telephone Encounter (Signed)
Submitted an URGENT Prior Authorization request to [facility] for TALTZ via CoverMyMeds. Awaiting clinical questions to populate  Key: [id number]  Knox Saliva, PharmD, MPH, BCPS, CPP Clinical Pharmacist (Rheumatology and Pulmonology)

## 2022-06-25 ENCOUNTER — Telehealth: Payer: Self-pay | Admitting: Pharmacist

## 2022-06-25 DIAGNOSIS — Z79899 Other long term (current) drug therapy: Secondary | ICD-10-CM

## 2022-06-25 DIAGNOSIS — L408 Other psoriasis: Secondary | ICD-10-CM

## 2022-06-25 DIAGNOSIS — L4050 Arthropathic psoriasis, unspecified: Secondary | ICD-10-CM

## 2022-06-25 NOTE — Telephone Encounter (Signed)
Per review of SRS chart review, hepatitis panel unable to be located. Will place order for patient to have drawn with next CBC, CMP.  Orders Placed This Encounter  Procedures   Hepatitis B surface antigen    Standing Status:   Standing    Number of Occurrences:   1    Standing Expiration Date:   06/26/2023   Hepatitis B core antibody, IgM    Standing Status:   Standing    Number of Occurrences:   1    Standing Expiration Date:   06/26/2023   Hepatitis C antibody    Standing Status:   Standing    Number of Occurrences:   1    Standing Expiration Date:   06/26/2023     Knox Saliva, PharmD, MPH, BCPS, CPP Clinical Pharmacist (Rheumatology and Pulmonology)

## 2022-06-25 NOTE — Telephone Encounter (Signed)
Received a fax regarding Prior Authorization from [place] for [place]. Authorization has been DENIED because The self-administered primary preferred products for the patient's health plan are Cosentyx, Enbrel, Humira, [place] Nail, Rinvoq, Skyrizi [place], Stelara , and Tremfya for psoriatic arthritis. The self-administered secondary preferred product is Cimzia syringe..  Will route to Dr. Talbot Grumbling, PharmD, MPH, BCPS, CPP Clinical Pharmacist (Rheumatology and Pulmonology)

## 2022-06-25 NOTE — Telephone Encounter (Signed)
Okay to apply for Cosyntex.

## 2022-06-25 NOTE — Telephone Encounter (Signed)
Left VM for patient to confirm she is in agreement for Cosentyx. Left VM requesting return call  Knox Saliva, PharmD, MPH, BCPS, CPP Clinical Pharmacist (Rheumatology and Pulmonology)

## 2022-06-26 ENCOUNTER — Telehealth: Payer: Self-pay | Admitting: Pharmacist

## 2022-06-26 NOTE — Telephone Encounter (Signed)
Submitted an URGENT Prior Authorization request to [facility] for COSENTYX via CoverMyMeds. Will update once we receive a response.  Key[place]us Height  Patient will need consent at new start visit.  Patient also eligible for copay card once authorization approved.  Knox Saliva, PharmD, MPH, BCPS, CPP Clinical Pharmacist (Rheumatology and Pulmonology)

## 2022-06-26 NOTE — Telephone Encounter (Signed)
Patient returned call. We reviewed Cosentyx. She is in agreement to move forward with Cosentyx authorization  Counseled patient that Cosentyx is a IL-17 inhibitor.  Counseled patient on purpose, proper use, and adverse effects of Cosentyx. Reviewed the most common adverse effects of infection (more commonly nasopharyngitis, URTI), inflammatory bowel disease, and allergic reaction.  Reviewed the importance of regular labs while on Cosentyx.  Will monitor CBC and CMP 1 month after starting and every 3 months routinely thereafter. Will monitor TB gold annually.  Patient VERBALLY consented to Cosentyx.  Will obtain consent at new start visit.  Will apply for Cosentyx through patient's insurance.   Patient dose will be for plaque psoriasis +/- psoriatic arthritis 300 mg every 7 days for 5 weeks then 300 mg every 28 days.   Knox Saliva, PharmD, MPH, BCPS, CPP Clinical Pharmacist (Rheumatology and Pulmonology)

## 2022-06-29 ENCOUNTER — Encounter: Payer: BC Managed Care – PPO | Attending: Nephrology | Admitting: Dietician

## 2022-06-29 ENCOUNTER — Encounter: Payer: Self-pay | Admitting: Dietician

## 2022-06-29 ENCOUNTER — Other Ambulatory Visit (HOSPITAL_COMMUNITY): Payer: Self-pay

## 2022-06-29 DIAGNOSIS — N189 Chronic kidney disease, unspecified: Secondary | ICD-10-CM | POA: Insufficient documentation

## 2022-06-29 DIAGNOSIS — M1990 Unspecified osteoarthritis, unspecified site: Secondary | ICD-10-CM | POA: Diagnosis not present

## 2022-06-29 DIAGNOSIS — E559 Vitamin D deficiency, unspecified: Secondary | ICD-10-CM | POA: Insufficient documentation

## 2022-06-29 DIAGNOSIS — I10 Essential (primary) hypertension: Secondary | ICD-10-CM | POA: Insufficient documentation

## 2022-06-29 DIAGNOSIS — M810 Age-related osteoporosis without current pathological fracture: Secondary | ICD-10-CM | POA: Insufficient documentation

## 2022-06-29 DIAGNOSIS — N182 Chronic kidney disease, stage 2 (mild): Secondary | ICD-10-CM

## 2022-06-29 DIAGNOSIS — Z713 Dietary counseling and surveillance: Secondary | ICD-10-CM | POA: Insufficient documentation

## 2022-06-29 NOTE — Telephone Encounter (Signed)
Received notification from [facility] regarding a prior authorization for [place]. Authorization has been APPROVED from 06/28/22 to 06/29/23.   Unable to run test claim as patient must fill [facility]: [phone]  Authorization #  [id number]   Enrolled patient into copay card [phone]) BIN: [id number] PCN: [id number] GRP: [id number] ID: [id number]  Patient scheduled for Cosentyx new started 06/30/22. Her last Enbrel dose was on 06/23/22 (she will not take Enbrel tomorrow).  Knox Saliva, PharmD, MPH, BCPS, CPP Clinical Pharmacist (Rheumatology and Pulmonology)

## 2022-06-29 NOTE — Progress Notes (Unsigned)
Pharmacy Note  Subjective:   Patient presents to clinic today to receive first dose of Cosentyx for PsA/psoriasis overlap. She has taken Enbrel since 2013 per chart review (if not sooner). Her last dose of Enbrel was on 06/23/22  Patient running a fever or have signs/symptoms of infection? {yes/no:20286}  Patient currently on antibiotics for the treatment of infection? {yes/no:20286}  Patient have any upcoming invasive procedures/surgeries? {yes/no:20286}  Objective: CMP     Component Value Date/Time   NA 139 05/28/2022 1420   K 5.0 05/28/2022 1420   CL 103 05/28/2022 1420   CO2 30 05/28/2022 1420   GLUCOSE 84 05/28/2022 1420   BUN 20 05/28/2022 1420   CREATININE 0.98 05/28/2022 1420   CALCIUM 9.6 05/28/2022 1420   PROT 7.5 05/28/2022 1420   ALBUMIN 3.7 02/04/2018 1618   AST 17 05/28/2022 1420   ALT 10 05/28/2022 1420   ALKPHOS 95 02/04/2018 1618   BILITOT 0.3 05/28/2022 1420   GFRNONAA 73 04/10/2021 0951   GFRAA 85 04/10/2021 0951    CBC    Component Value Date/Time   WBC 4.4 05/28/2022 1420   RBC 5.47 (H) 05/28/2022 1420   HGB 13.2 05/28/2022 1420   HCT 43.1 05/28/2022 1420   PLT 242 05/28/2022 1420   MCV 78.8 (L) 05/28/2022 1420   MCH 24.1 (L) 05/28/2022 1420   MCHC 30.6 (L) 05/28/2022 1420   RDW 14.3 05/28/2022 1420   LYMPHSABS 1,949 05/28/2022 1420   MONOABS 0.3 02/04/2018 1618   EOSABS 110 05/28/2022 1420   BASOSABS 48 05/28/2022 1420    Baseline Immunosuppressant Therapy Labs TB GOLD    Latest Ref Rng & Units 01/12/2022   11:33 AM  Quantiferon TB Gold  Quantiferon TB Gold Plus NEGATIVE NEGATIVE    Hepatitis Panel   HIV No results found for: "HIV" Immunoglobulins   SPEP    Latest Ref Rng & Units 05/28/2022    2:20 PM  Serum Protein Electrophoresis  Total Protein 6.1 - 8.1 g/dL 7.5    G6PD No results found for: "G6PDH" TPMT No results found for: "TPMT"   Chest x-ray: ***  Assessment/Plan:  Demonstrated proper injection technique with  *** demo device  Patient able to demonstrate proper injection technique using the teach back method.  Patient self injected in the *** with:  Sample Medication: *** NDC: *** Lot: *** Expiration: ***  Patient tolerated well.  Observed for 30 mins in office for adverse reaction and ***.   Patient is to return in 1 month for labs and 6-8 weeks for follow-up appointment.  Standing orders placed.   Cosentyx approved through insurance .   [facility] [phone].  Patient provided with pharmacy phone number and advised to call later this week to schedule shipment to home.  Patient will continue Cosentyx 300 mg SQ every 7 days for 5 weeks then 300 mg every 28 days.   She is switching to [facility] in January and has been advised to reach out to our clinic once she has obtained information.  All questions encouraged and answered.  Instructed patient to call with any further questions or concerns.  Knox Saliva, PharmD, MPH, BCPS, CPP Clinical Pharmacist (Rheumatology and Pulmonology)  06/29/2022 10:31 AM

## 2022-06-29 NOTE — Progress Notes (Signed)
Medical Nutrition Therapy  Appointment Start time:  [phone]  Appointment End time:  0930  Primary concerns today: She would like to learn more about nutrition to stabilize her weight and maintain or improve her kidney function.  Referral diagnosis: HTN Preferred learning style: no preference indicated Learning readiness: ready   NUTRITION ASSESSMENT   Anthropometrics   120 lbs Losing weight but not purposefully.  She has made diet changes to decrease phosphorous and potassium in her diet and lost as a result.  124 lbs 01/22/22 128 lbs 08/18/21   Clinical Medical Hx: HTN, osteoporosis, psoriatic arthritis, CKD with proteinuria, vitamin D deficiency Medications: [place] (for kidney's) Labs: K+ 5.0, BUN 20, Creatinine 0.98, eGFR 64, proteinuria (05/28/22). K+ 5.4 (03/24/22). Vitamin D 38 (01/12/22) Notable Signs/Symptoms: weight loss  Lifestyle & Dietary Hx Lives with her husband. She does most of the shopping and cooking. She reports recent bone density has improved on medication. She is a retired Firefighter. She is helping with her mother who has dementia.  Estimated daily fluid intake:  48 oz daily Supplements: MVI Sleep: 8 hours daily. Stress / self-care: very high Current average weekly physical activity: walks at the [facility] on the treadmill and has started weights and has thought about a personal trainer but wants to be more consistent with the walking first.  24-Hr Dietary Recall First Meal: 1 scrambled egg with 2T 1% milk mixed in with 1 piece buttered honey wheat toast Snack: none Second Meal: [place] sandwich with mayo and tomato OR chick-fila sandwich OR salad with green/red peppers, cucumber, nuts/seeds, carrot, ranch Snack: none Third Meal: baked chicken OR fish with a vegetable (cabbage, cole slaw, broccoli) Snack: none Beverages: water, 1/2 c coffee with flavored creamer, occasional green tea  Estimated Energy Needs Calories:  1600-1800 Protein: 45-55g  NUTRITION DIAGNOSIS  NB-1.1 Food and nutrition-related knowledge deficit As related to CKD and nutrition.  As evidenced by pt report and diet history.   NUTRITION INTERVENTION  Nutrition education (E-1) on the following topics:  Sodium, potassium, and phosphorus and CKD Discussed low vs. High potassium foods and how many to include daily Importance of adequate hydration Protein guidelines, sources of protein, benefits of plant-based protein Label reading in regards to eating out and resources to obtain information when eating out How to leach potassium from potatoes/vegetables Discussed importance of not being overly restrictive in order to maintain weight Basic overview of stages of kidney disease and how nutrition changes with each Available resources  Handouts Provided Include  NKD national kidney diet - Dish up a Kidney-Friendly Meal for Patients with Chronic Kidney Disease (not on dialysis) Potassium Content of Foods from AND Quick Reference Guide on Kidney Disease [place] for Change Teaching method utilized: Visual & Auditory  Demonstrated degree of understanding via: Teach Back  Barriers to learning/adherence to lifestyle change: none  Goals Resources: Calorie [employer] Kidney.org  Drink more water [employer] your potatoes (Peel, cut small, soak in a large volume of water 6-8 hours.  Rinse. Cook as desired.) Add more variety back to your diet. Continue a low potassium, low sodium diet Avoid/limit foods with added phos... Small amounts of protein.  Avoid being over restrictive.   MONITORING & EVALUATION Dietary intake, weekly physical activity, and label reading and follow up in 2 months.  Next Steps  Patient is to call for questions.

## 2022-06-29 NOTE — Patient Instructions (Incomplete)
Your next COSENTYX dose (2 injections each time) is due: on 8/1/2, 07/07/22, 07/14/22, 07/21/22, 07/28/22 THEN every 4 weeks thereafter (STARTING on 08/25/22)  STOP ENBREL - you may discard your remaining supply in sharps containers  HOLD COSENTYX if you have signs or symptoms of an infection. You can resume once you feel better or back to your baseline. HOLD COSENTYX if you start antibiotics to treat an infection. HOLD COSENTYX around the time of surgery/procedures. Your surgeon will be able to provide recommendations on when to hold BEFORE and when you are cleared to [place].  Pharmacy information: Your prescription will be shipped from [facility] Their phone number is [phone] Please call to schedule shipment and confirm address. They will mail your medication to your home.  Cost information: Your copay should be affordable. If you call the pharmacy and it is not affordable, please double-check that they are billing through your copay card as secondary coverage. That copay card information is: BIN: [id number] PCN: [id number] GRP: [id number] ID: [id number]  Labs are due in 1 month then every 3 months. Lab hours are from Monday to Thursday 1:30-4:30pm and Friday 1:30-4pm. You do not need an appointment if you come for labs during these times.  How to manage an injection site reaction: Remember the 5 C's: COUNTER - leave on the counter at least 30 minutes but up to overnight to bring medication to room temperature. This may help prevent stinging COLD - place something cold (like an ice gel pack or cold water bottle) on the injection site just before cleansing with alcohol. This may help reduce pain CLARITIN - use Claritin (generic name is loratadine) for the first two weeks of treatment or the day of, the day before, and the day after injecting. This will help to minimize injection site reactions CORTISONE CREAM - apply if injection site is irritated and itching CALL ME - if injection  site reaction is bigger than the size of your fist, looks infected, blisters, or if you develop hives

## 2022-06-29 NOTE — Patient Instructions (Addendum)
Resources: Calorie [employer] Kid[facility].org  Drink more water [employer] your potatoes (Peel, cut small, soak in a large volume of water 6-8 hours.  Rinse. Cook as desired.) Add more variety back to your diet. Continue a low potassium, low sodium diet Avoid/limit foods with added phos... Small amounts of protein.  Avoid being over restrictive.

## 2022-06-30 ENCOUNTER — Ambulatory Visit: Payer: BC Managed Care – PPO | Attending: Rheumatology | Admitting: Pharmacist

## 2022-06-30 DIAGNOSIS — L408 Other psoriasis: Secondary | ICD-10-CM

## 2022-06-30 DIAGNOSIS — Z7189 Other specified counseling: Secondary | ICD-10-CM

## 2022-06-30 DIAGNOSIS — Z79899 Other long term (current) drug therapy: Secondary | ICD-10-CM

## 2022-06-30 DIAGNOSIS — L4050 Arthropathic psoriasis, unspecified: Secondary | ICD-10-CM

## 2022-06-30 MED ORDER — COSENTYX SENSOREADY (300 MG) 150 MG/ML ~~LOC~~ SOAJ: SUBCUTANEOUS | 0 refills | Status: DC

## 2022-06-30 MED ORDER — COSENTYX SENSOREADY (300 MG) 150 MG/ML ~~LOC~~ SOAJ: SUBCUTANEOUS | 1 refills | Status: DC

## 2022-07-01 ENCOUNTER — Other Ambulatory Visit: Payer: Self-pay | Admitting: Rheumatology

## 2022-07-01 DIAGNOSIS — Z79899 Other long term (current) drug therapy: Secondary | ICD-10-CM

## 2022-07-01 DIAGNOSIS — L4050 Arthropathic psoriasis, unspecified: Secondary | ICD-10-CM

## 2022-07-01 DIAGNOSIS — L408 Other psoriasis: Secondary | ICD-10-CM

## 2022-07-02 MED ORDER — COSENTYX SENSOREADY (300 MG) 150 MG/ML ~~LOC~~ SOAJ: SUBCUTANEOUS | 1 refills | Status: DC

## 2022-07-03 ENCOUNTER — Ambulatory Visit
Admission: RE | Admit: 2022-07-03 | Discharge: 2022-07-03 | Disposition: A | Discharge status: Home / Self Care | Payer: BC Managed Care – PPO | Source: Ambulatory Visit | Attending: Family Medicine | Admitting: Family Medicine

## 2022-07-03 ENCOUNTER — Other Ambulatory Visit: Payer: Self-pay | Admitting: Family Medicine

## 2022-07-03 DIAGNOSIS — M542 Cervicalgia: Secondary | ICD-10-CM

## 2022-07-07 ENCOUNTER — Other Ambulatory Visit: Payer: Self-pay | Admitting: Family Medicine

## 2022-07-07 DIAGNOSIS — M5030 Other cervical disc degeneration, unspecified cervical region: Secondary | ICD-10-CM

## 2022-07-07 DIAGNOSIS — M542 Cervicalgia: Secondary | ICD-10-CM

## 2022-07-13 ENCOUNTER — Ambulatory Visit: Payer: BC Managed Care – PPO | Admitting: Dermatology

## 2022-07-13 DIAGNOSIS — W57XXXA Bitten or stung by nonvenomous insect and other nonvenomous arthropods, initial encounter: Secondary | ICD-10-CM

## 2022-07-13 DIAGNOSIS — L4050 Arthropathic psoriasis, unspecified: Secondary | ICD-10-CM | POA: Diagnosis not present

## 2022-07-13 DIAGNOSIS — S80862A Insect bite (nonvenomous), left lower leg, initial encounter: Secondary | ICD-10-CM | POA: Diagnosis not present

## 2022-07-13 DIAGNOSIS — L409 Psoriasis, unspecified: Secondary | ICD-10-CM | POA: Diagnosis not present

## 2022-07-13 DIAGNOSIS — L819 Disorder of pigmentation, unspecified: Secondary | ICD-10-CM

## 2022-07-13 MED ORDER — TRIAMCINOLONE ACETONIDE 0.1 % EX CREA: TOPICAL_CREAM | CUTANEOUS | 1 refills | Status: AC

## 2022-07-13 NOTE — Patient Instructions (Signed)
Due to recent changes in healthcare laws, you may see results of your pathology and/or laboratory studies on MyChart before the doctors have had a chance to review them. We understand that in some cases there may be results that are confusing or concerning to you. Please understand that not all results are received at the same time and often the doctors may need to interpret multiple results in order to provide you with the best plan of care or course of treatment. Therefore, we ask that you please give us 2 business days to thoroughly review all your results before contacting the office for clarification. Should we see a critical lab result, you will be contacted sooner.   If You Need Anything After Your Visit  If you have any questions or concerns for your doctor, please call our main line at [phone] and press option 4 to reach your doctor's medical assistant. If no one answers, please leave a voicemail as directed and we will return your call as soon as possible. Messages left after 4 pm will be answered the following business day.   You may also send us a message via MyChart. We typically respond to MyChart messages within 1-2 business days.  For prescription refills, please ask your pharmacy to contact our office. Our fax number is [phone].  If you have an urgent issue when the clinic is closed that cannot wait until the next business day, you can page your doctor at the number below.    Please note that while we do our best to be available for urgent issues outside of office hours, we are not available 24/7.   If you have an urgent issue and are unable to reach us, you may choose to seek medical care at your doctor's office, retail clinic, urgent care center, or emergency room.  If you have a medical emergency, please immediately call 911 or go to the emergency department.  Pager Numbers  - Dr. Kowalski: [phone]  - Dr. Moye: [phone]  - Dr. Stewart:  [phone]  In the event of inclement weather, please call our main line at [phone] for an update on the status of any delays or closures.  Dermatology Medication Tips: Please keep the boxes that topical medications come in in order to help keep track of the instructions about where and how to use these. Pharmacies typically print the medication instructions only on the boxes and not directly on the medication tubes.   If your medication is too expensive, please contact our office at [phone] option 4 or send us a message through MyChart.   We are unable to tell what your co-pay for medications will be in advance as this is different depending on your insurance coverage. However, we may be able to find a substitute medication at lower cost or fill out paperwork to get insurance to cover a needed medication.   If a prior authorization is required to get your medication covered by your insurance company, please allow us 1-2 business days to complete this process.  Drug prices often vary depending on where the prescription is filled and some pharmacies may offer cheaper prices.  The website www.goodrx.com contains coupons for medications through different pharmacies. The prices here do not account for what the cost may be with help from insurance (it may be cheaper with your insurance), but the website can give you the price if you did not use any insurance.  - You can print the associated coupon and take it with   your prescription to the pharmacy.  - You may also stop by our office during regular business hours and pick up a GoodRx coupon card.  - If you need your prescription sent electronically to a different pharmacy, notify our office through [facility] MyChart or by phone at [phone] option 4.     Si Usted Necesita Algo Despus de Su Visita  Tambin puede enviarnos un mensaje a travs de MyChart. Por lo general respondemos a los mensajes de MyChart en el transcurso de 1 a 2  das hbiles.  Para renovar recetas, por favor pida a su farmacia que se ponga en contacto con nuestra oficina. Nuestro nmero de fax es el [phone].  Si tiene un asunto urgente cuando la clnica est cerrada y que no puede esperar hasta el siguiente da hbil, puede llamar/localizar a su doctor(a) al nmero que aparece a continuacin.   Por favor, tenga en cuenta que aunque hacemos todo lo posible para estar disponibles para asuntos urgentes fuera del horario de oficina, no estamos disponibles las 24 horas del da, los 7 das de la semana.   Si tiene un problema urgente y no puede comunicarse con nosotros, puede optar por buscar atencin mdica  en el consultorio de su doctor(a), en una clnica privada, en un centro de atencin urgente o en una sala de emergencias.  Si tiene una emergencia mdica, por favor llame inmediatamente al 911 o vaya a la sala de emergencias.  Nmeros de bper  - Dr. Kowalski: [phone]  - Dra. Moye: [phone]  - Dra. Stewart: [phone]  En caso de inclemencias del tiempo, por favor llame a nuestra lnea principal al [phone] para una actualizacin sobre el estado de cualquier retraso o cierre.  Consejos para la medicacin en dermatologa: Por favor, guarde las cajas en las que vienen los medicamentos de uso tpico para ayudarle a seguir las instrucciones sobre dnde y cmo usarlos. Las farmacias generalmente imprimen las instrucciones del medicamento slo en las cajas y no directamente en los tubos del medicamento.   Si su medicamento es muy caro, por favor, pngase en contacto con nuestra oficina llamando al [phone] y presione la opcin 4 o envenos un mensaje a travs de MyChart.   No podemos decirle cul ser su copago por los medicamentos por adelantado ya que esto es diferente dependiendo de la cobertura de su seguro. Sin embargo, es posible que podamos encontrar un medicamento sustituto a menor costo o llenar un formulario para que el  seguro cubra el medicamento que se considera necesario.   Si se requiere una autorizacin previa para que su compaa de seguros cubra su medicamento, por favor permtanos de 1 a 2 das hbiles para completar este proceso.  Los precios de los medicamentos varan con frecuencia dependiendo del lugar de dnde se surte la receta y alguna farmacias pueden ofrecer precios ms baratos.  El sitio web www.goodrx.com tiene cupones para medicamentos de diferentes farmacias. Los precios aqu no tienen en cuenta lo que podra costar con la ayuda del seguro (puede ser ms barato con su seguro), pero el sitio web puede darle el precio si no utiliz ningn seguro.  - Puede imprimir el cupn correspondiente y llevarlo con su receta a la farmacia.  - Tambin puede pasar por nuestra oficina durante el horario de atencin regular y recoger una tarjeta de cupones de GoodRx.  - Si necesita que su receta se enve electrnicamente a una farmacia diferente, informe a nuestra oficina a travs de MyChart de [place]   o por telfono llamando al [phone] y presione la opcin 4.  

## 2022-07-13 NOTE — Progress Notes (Signed)
New Patient Visit  Subjective  Taylor Kirby is a 66 y.o. female who presents for the following: Psoriasis with psoriatic arthritis (Of the legs - patient currently using Cosentyx SQ injections, Clobetasol 0.05% PRN, and A&D ointment PRN. Patient has noticed improvement in conditions since starting Cosentyx on 06/30/22. She is being followed by a rheumatologist who prescribed the Cosentyx and Clobetasol and has tried MTX, and Enbrel in the past).  The following portions of the chart were reviewed this encounter and updated as appropriate:   Tobacco  Allergies  Meds  Problems  Med Hx  Surg Hx  Fam Hx     Review of Systems:  No other skin or systemic complaints except as noted in HPI or Assessment and Plan.  Objective  Well appearing patient in no apparent distress; mood and affect are within normal limits.  A focused examination was performed including the face, arms, and legs. Relevant physical exam findings are noted in the Assessment and Plan.  B/L lower legs Plaques of the lower legs.        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 Condition is symptomatic / bothersome to patient. Not to goal.  Psoriasis is a chronic non-curable, but treatable genetic/hereditary disease that may have other systemic features affecting other organ systems such as joints (Psoriatic Arthritis). It is associated with an increased risk of inflammatory bowel disease, heart disease, non-alcoholic fatty liver disease, and depression.    Continue care with rheumatologist.   Continue Cosentyx '300mg'$ /mL SQ as directed. Reviewed risks of biologics including immunosuppression, infections, injection site reaction, and failure to improve condition. Goal is control of skin condition, not cure.  Some older biologics such as Humira and Enbrel may slightly increase  risk of malignancy and may worsen congestive heart failure.  Talz and Cosentyx may cause inflammatory bowel disease to flare. The use of biologics requires long term medication management, including periodic office visits and monitoring of blood work.  D/C Clobetasol 0.05% cream. Start TMC 0.1% cream to aa's QD-BID 5d/wk. Topical steroids (such as triamcinolone, fluocinolone, fluocinonide, mometasone, clobetasol, halobetasol, betamethasone, hydrocortisone) can cause thinning and lightening of the skin if they are used for too long in the same area. Your physician has selected the right strength medicine for your problem and area affected on the body. Please use your medication only as directed by your physician to prevent side effects.   triamcinolone cream (KENALOG) 0.1 % - B/L lower legs Apply to aa's psoriasis QD-BID up to 5d/wk. Avoid applying to face, groin, and axilla. Use as directed. Long-term use can cause thinning of the skin.  Insect bite of left lower leg with local reaction, initial encounter L lower leg Ok to use TMC 0.1% cream to aa's QD PRN up to 5 days per week. Topical steroids (such as triamcinolone, fluocinolone, fluocinonide, mometasone, clobetasol, halobetasol, betamethasone, hydrocortisone) can cause thinning and lightening of the skin if they are used for too long in the same area. Your physician has selected the right strength medicine for your problem and area affected on the body. Please use your medication only as directed by your physician to prevent side effects.   Return in about 6 months (around 01/13/2023) for psoriasis follow up .  Luther Redo, CMA, am acting as scribe for Sarina Ser, MD . Documentation: I have reviewed the above documentation for accuracy and  completeness, and I agree with the above.  Sarina Ser, MD

## 2022-07-20 ENCOUNTER — Encounter: Payer: Self-pay | Admitting: Dermatology

## 2022-07-27 ENCOUNTER — Other Ambulatory Visit: Payer: BC Managed Care – PPO

## 2022-08-07 ENCOUNTER — Other Ambulatory Visit: Payer: Self-pay | Admitting: *Deleted

## 2022-08-07 DIAGNOSIS — Z79899 Other long term (current) drug therapy: Secondary | ICD-10-CM

## 2022-08-07 DIAGNOSIS — L4050 Arthropathic psoriasis, unspecified: Secondary | ICD-10-CM

## 2022-08-07 DIAGNOSIS — L408 Other psoriasis: Secondary | ICD-10-CM

## 2022-08-10 LAB — CBC WITH DIFFERENTIAL/PLATELET
Absolute Monocytes: 293 cells/uL (ref 200–950)
Basophils Absolute: 20 cells/uL (ref 0–200)
Basophils Relative: 0.5 %
Eosinophils Absolute: 90 cells/uL (ref 15–500)
Eosinophils Relative: 2.3 %
HCT: 41.4 % (ref 35.0–45.0)
Hemoglobin: 12.8 g/dL (ref 11.7–15.5)
Lymphs Abs: 1443 cells/uL (ref 850–3900)
MCH: 24.9 pg — ABNORMAL LOW (ref 27.0–33.0)
MCHC: 30.9 g/dL — ABNORMAL LOW (ref 32.0–36.0)
MCV: 80.5 fL (ref 80.0–100.0)
MPV: 11.6 fL (ref 7.5–12.5)
Monocytes Relative: 7.5 %
Neutro Abs: 2055 cells/uL (ref 1500–7800)
Neutrophils Relative %: 52.7 %
Platelets: 267 10*3/uL (ref 140–400)
RBC: 5.14 10*6/uL — ABNORMAL HIGH (ref 3.80–5.10)
RDW: 14.9 % (ref 11.0–15.0)
Total Lymphocyte: 37 %
WBC: 3.9 10*3/uL (ref 3.8–10.8)

## 2022-08-10 LAB — COMPLETE METABOLIC PANEL WITH GFR
AG Ratio: 1.1 (calc) (ref 1.0–2.5)
ALT: 19 U/L (ref 6–29)
AST: 20 U/L (ref 10–35)
Albumin: 3.8 g/dL (ref 3.6–5.1)
Alkaline phosphatase (APISO): 85 U/L (ref 37–153)
BUN: 17 mg/dL (ref 7–25)
CO2: 29 mmol/L (ref 20–32)
Calcium: 9.3 mg/dL (ref 8.6–10.4)
Chloride: 102 mmol/L (ref 98–110)
Creat: 0.91 mg/dL (ref 0.50–1.05)
Globulin: 3.4 g/dL (calc) (ref 1.9–3.7)
Glucose, Bld: 84 mg/dL (ref 65–99)
Potassium: 4.2 mmol/L (ref 3.5–5.3)
Sodium: 139 mmol/L (ref 135–146)
Total Bilirubin: 0.4 mg/dL (ref 0.2–1.2)
Total Protein: 7.2 g/dL (ref 6.1–8.1)
eGFR: 70 mL/min/{1.73_m2} (ref >=60)

## 2022-08-10 LAB — HEPATITIS B CORE ANTIBODY, IGM: Hep B C IgM: NONREACTIVE

## 2022-08-10 LAB — HEPATITIS B SURFACE ANTIGEN: Hepatitis B Surface Ag: NONREACTIVE

## 2022-08-10 LAB — HEPATITIS C ANTIBODY: Hepatitis C Ab: NONREACTIVE

## 2022-08-10 NOTE — Progress Notes (Signed)
CBC and CMP are stable.  Hepatitis panel is pending.

## 2022-08-11 NOTE — Progress Notes (Signed)
Hepatitis panel is negative.

## 2022-08-16 NOTE — Progress Notes (Deleted)
Office Visit Note  Patient: Taylor Kirby             Date of Birth: 11/06/1956           MRN: [id number]             PCP: Harlan Stains, MD Referring: Harlan Stains, MD Visit Date: 08/27/2022 Occupation: '@GUAROCC'$ @  Subjective:  No chief complaint on file.   History of Present Illness: Taylor Kirby is a 66 y.o. female ***   CBC and CMP updated on 08/07/22 TB gold negative on 01/22/22   Activities of Daily Living:  Patient reports morning stiffness for *** {minute/hour:19697}.   Patient {ACTIONS;DENIES/REPORTS:21021675::"Denies"} nocturnal pain.  Difficulty dressing/grooming: {ACTIONS;DENIES/REPORTS:21021675::"Denies"} Difficulty climbing stairs: {ACTIONS;DENIES/REPORTS:21021675::"Denies"} Difficulty getting out of chair: {ACTIONS;DENIES/REPORTS:21021675::"Denies"} Difficulty using hands for taps, buttons, cutlery, and/or writing: {ACTIONS;DENIES/REPORTS:21021675::"Denies"}  No Rheumatology ROS completed.   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   High risk medication use 10/03/2016   Psoriatic arthritis ([place]) 10/25/2015    Past Medical History:  Diagnosis Date   Allergy    Anemia    Chronic kidney disease    Heart murmur    Osteoporosis    Rheumatoid arthritis ([place])     Family History  Problem Relation Age of Onset   Hyperlipidemia Mother    Hypertension Mother    Stroke Mother    Hyperlipidemia Father    Cancer Father    Heart attack Father    Hypertension Sister    Heart disease Maternal Grandmother    Cancer Maternal Grandfather    Hypertension Paternal Grandmother    Past Surgical History:  Procedure Laterality Date   TUBAL LIGATION     Social History   Social History Narrative   Not on file   Immunization History  Administered Date(s) Administered   Moderna  SARS-COV2 Booster Vaccination 11/19/2020   Moderna Sars-Covid-2 Vaccination 01/27/2020, 02/24/2020     Objective: Vital Signs: There were no vitals taken for this visit.   Physical Exam   Musculoskeletal Exam: ***  CDAI Exam: CDAI Score: -- Patient Global: --; Provider Global: -- Swollen: --; Tender: -- Joint Exam 08/27/2022   No joint exam has been documented for this visit   There is currently no information documented on the homunculus. Go to the Rheumatology activity and complete the homunculus joint exam.  Investigation: No additional findings.  Imaging: No results found.  Recent Labs: Lab Results  Component Value Date   WBC 3.9 08/07/2022   HGB 12.8 08/07/2022   PLT 267 08/07/2022   NA 139 08/07/2022   K 4.2 08/07/2022   CL 102 08/07/2022   CO2 29 08/07/2022   GLUCOSE 84 08/07/2022   BUN 17 08/07/2022   CREATININE 0.91 08/07/2022   BILITOT 0.4 08/07/2022   ALKPHOS 95 02/04/2018   AST 20 08/07/2022   ALT 19 08/07/2022   PROT 7.2 08/07/2022   ALBUMIN 3.7 02/04/2018   CALCIUM 9.3 08/07/2022   GFRAA 85 04/10/2021   QFTBGOLDPLUS NEGATIVE 01/12/2022    Speciality Comments: Discontinued methotrexate September 2021 due to low WBC Fosamax March 2019 till March 2023 with few gaps.  Procedures:  No procedures performed Allergies: Penicillins and Sulfa antibiotics   Assessment / Plan:     Visit Diagnoses: Psoriatic arthritis ([place])  Psoriasis  High risk medication  use  Contracture of elbow joint, right  Age-related osteoporosis without current pathological fracture  Vitamin D deficiency  Essential hypertension  Renal calcinosis  Orders: No orders of the defined types were placed in this encounter.  No orders of the defined types were placed in this encounter.   Face-to-face time spent with patient was *** minutes. Greater than 50% of time was spent in counseling and coordination of care.  Follow-Up Instructions: No follow-ups on  file.   Ofilia Neas, PA-C  Note - This record has been created using Dragon software.  Chart creation errors have been sought, but may not always  have been located. Such creation errors do not reflect on  the standard of medical care.,

## 2022-08-24 ENCOUNTER — Encounter: Payer: Self-pay | Admitting: Rheumatology

## 2022-08-24 NOTE — Telephone Encounter (Signed)
Medications which suppress your immune system makes you more prone towards infection.  If her symptoms from COVID-19 infection are already better then probably Paxlovid is not recommended .  She should discuss treatment of COVID-19 infection with her PCP.  Cosyntex does not affect the kidneys.  Recent labs show normal kidney function.

## 2022-08-24 NOTE — Progress Notes (Deleted)
Office Visit Note  Patient: Taylor Kirby             Date of Birth: Mar 07, 1956           MRN: [id number]             PCP: Harlan Stains, MD Referring: Harlan Stains, MD Visit Date: 09/02/2022 Occupation: '@GUAROCC'$ @  Subjective:    History of Present Illness: Taylor Kirby is a 65 y.o. female with history of psoriatic arthritis and osteoporosis.  Taylor Kirby is currently on cosentyx 300 mg sq injections every 4 weeks.  Patient was initiated on cosentyx on 06/30/22.   CBC and CMP updated on 08/07/22. Taylor Kirby will be due to update lab work in December and every 3 months.  Standing orders for CBC and CMP remain in place. TB gold negative on 01/12/22.   Discussed the importance of holding cosentyx if Taylor Kirby develops signs or symptoms of an infection and to resume once the infection has completely cleared.   Activities of Daily Living:  Patient reports morning stiffness for *** {minute/hour:19697}.   Patient {ACTIONS;DENIES/REPORTS:21021675::"Denies"} nocturnal pain.  Difficulty dressing/grooming: {ACTIONS;DENIES/REPORTS:21021675::"Denies"} Difficulty climbing stairs: {ACTIONS;DENIES/REPORTS:21021675::"Denies"} Difficulty getting out of chair: {ACTIONS;DENIES/REPORTS:21021675::"Denies"} Difficulty using hands for taps, buttons, cutlery, and/or writing: {ACTIONS;DENIES/REPORTS:21021675::"Denies"}  No Rheumatology ROS completed.   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   High risk medication use 10/03/2016   Psoriatic arthritis ([place]) 10/25/2015    Past Medical History:  Diagnosis Date   Allergy    Anemia    Chronic kidney disease    Heart murmur    Osteoporosis    Rheumatoid arthritis ([place])     Family History  Problem Relation Age of Onset   Hyperlipidemia Mother    Hypertension Mother    Stroke Mother     Hyperlipidemia Father    Cancer Father    Heart attack Father    Hypertension Sister    Heart disease Maternal Grandmother    Cancer Maternal Grandfather    Hypertension Paternal Grandmother    Past Surgical History:  Procedure Laterality Date   TUBAL LIGATION     Social History   Social History Narrative   Not on file   Immunization History  Administered Date(s) Administered   Moderna SARS-COV2 Booster Vaccination 11/19/2020   Moderna Sars-Covid-2 Vaccination 01/27/2020, 02/24/2020     Objective: Vital Signs: There were no vitals taken for this visit.   Physical Exam Vitals and nursing note reviewed.  Constitutional:      Appearance: Taylor Kirby is well-developed.  HENT:     Head: Normocephalic and atraumatic.  Eyes:     Conjunctiva/sclera: Conjunctivae normal.  Cardiovascular:     Rate and Rhythm: Normal rate and regular rhythm.     Heart sounds: Normal heart sounds.  Pulmonary:     Effort: Pulmonary effort is normal.     Breath sounds: Normal breath sounds.  Abdominal:     General: Bowel sounds are normal.     Palpations: Abdomen is soft.  Musculoskeletal:     Cervical back: Normal range of motion.  Skin:    General: Skin is warm and dry.     Capillary Refill: Capillary refill takes less than 2 seconds.  Neurological:     Mental Status: Taylor Kirby is alert and oriented to person, place, and time.  Psychiatric:        Behavior: Behavior normal.      Musculoskeletal Exam: ***  CDAI Exam: CDAI Score: -- Patient Global: --; Provider Global: -- Swollen: --; Tender: -- Joint Exam 09/02/2022   No joint exam has been documented for this visit   There is currently no information documented on the homunculus. Go to the Rheumatology activity and complete the homunculus joint exam.  Investigation: No additional findings.  Imaging: No results found.  Recent Labs: Lab Results  Component Value Date   WBC 3.9 08/07/2022   HGB 12.8 08/07/2022   PLT 267 08/07/2022    NA 139 08/07/2022   K 4.2 08/07/2022   CL 102 08/07/2022   CO2 29 08/07/2022   GLUCOSE 84 08/07/2022   BUN 17 08/07/2022   CREATININE 0.91 08/07/2022   BILITOT 0.4 08/07/2022   ALKPHOS 95 02/04/2018   AST 20 08/07/2022   ALT 19 08/07/2022   PROT 7.2 08/07/2022   ALBUMIN 3.7 02/04/2018   CALCIUM 9.3 08/07/2022   GFRAA 85 04/10/2021   QFTBGOLDPLUS NEGATIVE 01/12/2022    Speciality Comments: Discontinued methotrexate September 2021 due to low WBC Fosamax March 2019 till March 2023 with few gaps.  Procedures:  No procedures performed Allergies: Penicillins and Sulfa antibiotics   Assessment / Plan:     Visit Diagnoses: No diagnosis found.  Orders: No orders of the defined types were placed in this encounter.  No orders of the defined types were placed in this encounter.   Face-to-face time spent with patient was *** minutes. Greater than 50% of time was spent in counseling and coordination of care.  Follow-Up Instructions: No follow-ups on file.   Earnestine Mealing, CMA  Note - This record has been created using Editor, commissioning.  Chart creation errors have been sought, but may not always  have been located. Such creation errors do not reflect on  the standard of medical care.

## 2022-08-27 ENCOUNTER — Ambulatory Visit: Payer: BC Managed Care – PPO | Admitting: Physician Assistant

## 2022-08-27 DIAGNOSIS — I10 Essential (primary) hypertension: Secondary | ICD-10-CM

## 2022-08-27 DIAGNOSIS — N29 Other disorders of kidney and ureter in diseases classified elsewhere: Secondary | ICD-10-CM

## 2022-08-27 DIAGNOSIS — M810 Age-related osteoporosis without current pathological fracture: Secondary | ICD-10-CM

## 2022-08-27 DIAGNOSIS — Z79899 Other long term (current) drug therapy: Secondary | ICD-10-CM

## 2022-08-27 DIAGNOSIS — E559 Vitamin D deficiency, unspecified: Secondary | ICD-10-CM

## 2022-08-27 DIAGNOSIS — M24521 Contracture, right elbow: Secondary | ICD-10-CM

## 2022-08-27 DIAGNOSIS — L409 Psoriasis, unspecified: Secondary | ICD-10-CM

## 2022-08-27 DIAGNOSIS — L4050 Arthropathic psoriasis, unspecified: Secondary | ICD-10-CM

## 2022-09-02 ENCOUNTER — Telehealth: Payer: Self-pay | Admitting: *Deleted

## 2022-09-02 ENCOUNTER — Ambulatory Visit: Payer: BC Managed Care – PPO | Attending: Physician Assistant | Admitting: Physician Assistant

## 2022-09-02 DIAGNOSIS — M810 Age-related osteoporosis without current pathological fracture: Secondary | ICD-10-CM

## 2022-09-02 DIAGNOSIS — E559 Vitamin D deficiency, unspecified: Secondary | ICD-10-CM

## 2022-09-02 DIAGNOSIS — Z79899 Other long term (current) drug therapy: Secondary | ICD-10-CM

## 2022-09-02 DIAGNOSIS — L4050 Arthropathic psoriasis, unspecified: Secondary | ICD-10-CM

## 2022-09-02 DIAGNOSIS — M24521 Contracture, right elbow: Secondary | ICD-10-CM

## 2022-09-02 DIAGNOSIS — I10 Essential (primary) hypertension: Secondary | ICD-10-CM

## 2022-09-02 DIAGNOSIS — L408 Other psoriasis: Secondary | ICD-10-CM

## 2022-09-02 NOTE — Telephone Encounter (Signed)
Patient contacted the office inquiring about when to take her Cosentyx. Patient was started on the Cosentyx loading dose on 06/30/2022 in office. Patient took her next doses on 07/07/2022 and 07/14/2022. Patient states she was then diagnosed with Covid. Patient states she missed her loading doses for 07/21/2022 and 07/28/2022. Patient's next injection after 07/28/2022 would have been 08/25/2022 and she has not done that injection as well. Patient would like to know when she should take her next injection and should finish it out as if she were still loading? Patient has rescheduled her follow up for 09/16/2022. She has been symptom free from Covid since 08/28/2022. Please advise.

## 2022-09-03 NOTE — Addendum Note (Signed)
Addended by: Cassandria Anger on: 09/03/2022 10:27 AM   Modules accepted: Orders

## 2022-09-03 NOTE — Telephone Encounter (Signed)
Returned call to patient regarding Cosentyx. She states she has 8 USED pens at home.  This means that she completed her loading dose regimen: 2 pens on 07/07/22, 2 pens on 07/14/22, 2 pens on 07/21/22, and 2 pens on 07/28/22.  She states that she has one unopened box of two Cosentyx pens. She will plan to take the dose TODAY, 09/03/22.  Reviewed that moving forward she will always use TWO pens but will be every 4 weeks since she completed the entire loading dose. She verbalized understanding and will set reminder in [place], PharmD, MPH, BCPS, CPP Clinical Pharmacist (Rheumatology and Pulmonology)

## 2022-09-07 NOTE — Progress Notes (Unsigned)
Office Visit Note  Patient: Taylor Kirby             Date of Birth: 1956/06/04           MRN: [id number]             PCP: Harlan Stains, MD Referring: Harlan Stains, MD Visit Date: 09/15/2022 Occupation: '@GUAROCC'$ @  Subjective:  Medication monitoring   History of Present Illness: Taylor Kirby is a 66 y.o. female with history psoriatic arthritis and osteoporosis.  She is on cosentyx 300 mg sq injections every 4 weeks.  Patient initiated Cosentyx on 06/30/2022.  She has been tolerating Cosentyx without any side effects or injection site reactions.  She was recently diagnosed with COVID-19 on 08/23/2022.  She tested positive until 08/31/22.  She held her dose of Cosentyx on 08/25/2022 and restarted Cosentyx on 09/03/2022.  Overall since initiating Cosentyx she has noticed about a 90% improvement in her symptoms.  Her psoriasis has almost completely cleared on her lower extremities.  She denies any increased joint pain, joint swelling, or joint stiffness.  She has not had any nocturnal symptoms.  She denies any Achilles tendinitis or plantar fasciitis.  She denies any SI joint discomfort.  She denies any other new medical conditions.     Activities of Daily Living:  Patient reports morning stiffness for 0 minutes.   Patient Denies nocturnal pain.  Difficulty dressing/grooming: Denies Difficulty climbing stairs: Denies Difficulty getting out of chair: Denies Difficulty using hands for taps, buttons, cutlery, and/or writing: Denies  Review of Systems  Constitutional:  Positive for fatigue.  HENT:  Positive for mouth dryness. Negative for mouth sores.   Eyes:  Negative for dryness.  Respiratory:  Negative for shortness of breath.   Cardiovascular:  Negative for chest pain and palpitations.  Gastrointestinal:  Negative for blood in stool, constipation and diarrhea.  Endocrine: Negative for increased urination.  Genitourinary:  Negative for involuntary urination.  Musculoskeletal:   Negative for joint pain, gait problem, joint pain, joint swelling, myalgias, muscle weakness, morning stiffness, muscle tenderness and myalgias.  Skin:  Negative for color change, rash, hair loss and sensitivity to sunlight.  Allergic/Immunologic: Negative for susceptible to infections.  Neurological:  Negative for dizziness and headaches.  Hematological:  Negative for swollen glands.  Psychiatric/Behavioral:  Negative for depressed mood and sleep disturbance. The patient is not nervous/anxious.     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   High risk medication use 10/03/2016   Psoriatic arthritis ([place]) 10/25/2015    Past Medical History:  Diagnosis Date   Allergy    Anemia    Chronic kidney disease    Heart murmur    Osteoporosis    Rheumatoid arthritis ([place])     Family History  Problem Relation Age of Onset   Hyperlipidemia Mother    Hypertension Mother    Stroke Mother    Hyperlipidemia Father    Cancer Father    Heart attack Father    Hypertension Sister    Heart disease Maternal Grandmother    Cancer Maternal Grandfather    Hypertension Paternal Grandmother    Past Surgical History:  Procedure Laterality Date   TUBAL LIGATION     Social History   Social History Narrative   Not on file   Immunization  History  Administered Date(s) Administered   Moderna SARS-COV2 Booster Vaccination 11/19/2020   Moderna Sars-Covid-2 Vaccination 01/27/2020, 02/24/2020     Objective: Vital Signs: BP 120/82 (BP Location: Left Arm, Patient Position: Sitting, Cuff Size: Normal)   Pulse 62   Resp 16   Ht '5\' 5"'$  (1.651 m)   Wt 121 lb 9.6 oz (55.2 kg)   BMI 20.24 kg/m    Physical Exam Vitals and nursing note reviewed.  Constitutional:      Appearance: She is well-developed.  HENT:     Head:  Normocephalic and atraumatic.  Eyes:     Conjunctiva/sclera: Conjunctivae normal.  Cardiovascular:     Rate and Rhythm: Normal rate and regular rhythm.     Heart sounds: Normal heart sounds.  Pulmonary:     Effort: Pulmonary effort is normal.     Breath sounds: Normal breath sounds.  Abdominal:     General: Bowel sounds are normal.     Palpations: Abdomen is soft.  Musculoskeletal:     Cervical back: Normal range of motion.  Skin:    General: Skin is warm and dry.     Capillary Refill: Capillary refill takes less than 2 seconds.  Neurological:     Mental Status: She is alert and oriented to person, place, and time.  Psychiatric:        Behavior: Behavior normal.      Musculoskeletal Exam: C-spine, thoracic spine, lumbar spine and good range of motion.  No midline spinal tenderness.  No SI joint tenderness.  Shoulder joints have good range of motion with no discomfort.  Right elbow joint flexion contracture.  Left elbow joint has good range of motion with no tenderness or inflammation.  Limited extension and flexion of both wrist joints.  Arthritis mutilans bilateral thumbs.  PIP and DIP thickening with subluxation but no active synovitis noted.  Hip joints have good range of motion with no groin pain.  Knee joints have good range of motion with no warmth or effusion.  Ankle joints have good range of motion with no tenderness or joint swelling.  No evidence of Achilles tendinitis.  CDAI Exam: CDAI Score: -- Patient Global: --; Provider Global: -- Swollen: --; Tender: -- Joint Exam 09/15/2022   No joint exam has been documented for this visit   There is currently no information documented on the homunculus. Go to the Rheumatology activity and complete the homunculus joint exam.  Investigation: No additional findings.  Imaging: No results found.  Recent Labs: Lab Results  Component Value Date   WBC 3.9 08/07/2022   HGB 12.8 08/07/2022   PLT 267 08/07/2022   NA 139  08/07/2022   K 4.2 08/07/2022   CL 102 08/07/2022   CO2 29 08/07/2022   GLUCOSE 84 08/07/2022   BUN 17 08/07/2022   CREATININE 0.91 08/07/2022   BILITOT 0.4 08/07/2022   ALKPHOS 95 02/04/2018   AST 20 08/07/2022   ALT 19 08/07/2022   PROT 7.2 08/07/2022   ALBUMIN 3.7 02/04/2018   CALCIUM 9.3 08/07/2022   GFRAA 85 04/10/2021   QFTBGOLDPLUS NEGATIVE 01/12/2022    Speciality Comments: Discontinued methotrexate September 2021 due to low WBC Fosamax March 2019 till March 2023 with few gaps.  Procedures:  No procedures performed Allergies: Penicillins and Sulfa antibiotics   Assessment / Plan:     Visit Diagnoses: Psoriatic arthritis ([place]) - Hx of arthritis mutilans-Bilateral thumbs: She has no synovitis or dactylitis on examination today.  No evidence of Achilles tendinitis  or plantar fasciitis.  No SI joint tenderness upon palpation.  She has not been experiencing any nocturnal pain or morning stiffness.  She has not had any signs or symptoms of a flare.  She has noticed about a 90% improvement in her symptoms since switching from Enbrel to Cosentyx.  She initiated Cosentyx on 06/30/2022 and has been tolerating without any side effects or injection site reactions.  She had a short gap in therapy after being diagnosed with COVID-19 on 08/23/2022.  She restarted Cosentyx on 09/03/2022.  She will remain on Cosentyx as monotherapy.  She was advised to notify us if she develops increased joint pain or joint swelling.  She will follow-up in the office in 3 months or sooner if needed.  Other psoriasis: Improving.  She has had almost complete skin clearance since initiating Cosentyx on 06/30/2022.  She has some dry skin on the anterolateral aspect of the left shin but no active patches at this time.  She will remain on Cosentyx as prescribed.  High risk medication use - Cosentyx 300 mg sq injections every 4 weeks.  Initiated Cosentyx on 06/30/22.   Previous therapy:enbrel and MTX  CBC and CMP updated on  08/07/22.  Her next lab work will be due in December and every 3 months to monitor for drug toxicity. TB gold negative on 01/12/22.  Patient was COVID-19 positive from 08/23/2022 to 08/31/22.  She held her dose of Cosentyx on 08/25/2022 and restarted on 09/03/2022. Discussed the importance of holding Cosentyx if she develops signs or symptoms of an infection and to resume once the infection is completely cleared.  Contracture of elbow joint, right: Unchanged.  No tenderness or inflammation.   Age-related osteoporosis without current pathological fracture - Fosamax was discontinued in March 2023 after her repeat DEXA scan  DEXA updated on 01/28/2022: Lumbar spine BMD 0.831 with T score -2.1.  Statistically significant increase in BMD of the left total hip.  The right total hip and lumbar spine are stable.  Recommend a drug holiday and a repeat DEXA in 2 years.  Discussed the use of calcium, vitamin D, and resistive exercises.  According to the patient she was told to avoid taking calcium supplement by her nephrologist.  Vitamin D deficiency  Essential hypertension: Blood pressure is 120/82 today in the office.  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   Orders: No orders of the defined types were placed in this encounter.  No orders of the defined types were placed in this encounter.   Follow-Up Instructions: Return in 3 months (on 12/16/2022) for Psoriatic arthritis, Osteoporosis.   Ofilia Neas, PA-C  Note - This record has been created using Dragon software.  Chart creation errors have been sought, but may not always  have been located. Such creation errors do not reflect on  the standard of medical care.

## 2022-09-15 ENCOUNTER — Encounter: Payer: Self-pay | Admitting: Physician Assistant

## 2022-09-15 ENCOUNTER — Ambulatory Visit: Payer: BC Managed Care – PPO | Attending: Physician Assistant | Admitting: Physician Assistant

## 2022-09-15 VITALS — BP 120/82 | HR 62 | Resp 16 | Ht 65.0 in | Wt 121.6 lb

## 2022-09-15 DIAGNOSIS — Z79899 Other long term (current) drug therapy: Secondary | ICD-10-CM | POA: Diagnosis not present

## 2022-09-15 DIAGNOSIS — M24521 Contracture, right elbow: Secondary | ICD-10-CM | POA: Diagnosis not present

## 2022-09-15 DIAGNOSIS — L408 Other psoriasis: Secondary | ICD-10-CM

## 2022-09-15 DIAGNOSIS — I10 Essential (primary) hypertension: Secondary | ICD-10-CM

## 2022-09-15 DIAGNOSIS — L4050 Arthropathic psoriasis, unspecified: Secondary | ICD-10-CM | POA: Diagnosis not present

## 2022-09-15 DIAGNOSIS — E559 Vitamin D deficiency, unspecified: Secondary | ICD-10-CM

## 2022-09-15 DIAGNOSIS — M810 Age-related osteoporosis without current pathological fracture: Secondary | ICD-10-CM

## 2022-09-15 DIAGNOSIS — N29 Other disorders of kidney and ureter in diseases classified elsewhere: Secondary | ICD-10-CM

## 2022-09-15 NOTE — Patient Instructions (Addendum)
Standing Labs We placed an order today for your standing lab work.   Please have your standing labs drawn in December and every 3 months   Please have your labs drawn 2 weeks prior to your appointment so that the provider can discuss your lab results at your appointment.  Please note that you may see your imaging and lab results in [place] before we have reviewed them. We will contact you once all results are reviewed. Please allow our office up to 72 hours to thoroughly review all of the results before contacting the office for clarification of your results.  Lab hours are: Monday through Thursday from 1:30 pm-4:30 pm and Friday from 1:30 pm- 4:00 pm  You may experience shorter wait times on Monday, Thursday or Friday afternoons,.   Effective September 28, 2022, new lab hours will be: Monday through Thursday from 8:00 am -12:30 pm and 1:00 pm-5:00 pm and Friday from 8:00 am-12:00 pm.  Please be advised, all patients with office appointments requiring lab work will take precedent over walk-in lab work.   Labs are drawn by [facility]. Please bring your co-pay at the time of your lab draw.  You may receive a bill from [facility] for your lab work.  Please note if you are on Hydroxychloroquine and and an order has been placed for a Hydroxychloroquine level, you will need to have it drawn 4 hours or more after your last dose.  If you wish to have your labs drawn at another location, please call the office 24 hours in advance so we can fax the orders.  The office is located at [address], [place], [place], [place] [address] No appointment is necessary.    If you have any questions regarding directions or hours of operation,  please call [phone].   As a reminder, please drink plenty of water prior to coming for your lab work. Thanks!  If you have signs or symptoms of an infection or start antibiotics: First, call your PCP for workup of your infection. Hold your medication through the  infection, until you complete your antibiotics, and until symptoms resolve if you take the following: Injectable medication (Actemra, Benlysta, Cimzia, Cosentyx, Enbrel, Humira, Kevzara, Orencia, Remicade, Simponi, Stelara, Taltz, Tremfya) Methotrexate Leflunomide (Arava) Mycophenolate (Cellcept) Morrie Sheldon, Olumiant, or Rinvoq   Vaccines You are taking a medication(s) that can suppress your immune system.  The following immunizations are recommended: Flu annually Covid-19  Td/Tdap (tetanus, diphtheria, pertussis) every 10 years Pneumonia (Prevnar 15 then Pneumovax 23 at least 1 year apart.  Alternatively, can take Prevnar 20 without needing additional dose) Shingrix: 2 doses from 4 weeks to 6 months apart  Please check with your PCP to make sure you are up to date.

## 2022-09-16 ENCOUNTER — Ambulatory Visit: Payer: BC Managed Care – PPO | Admitting: Physician Assistant

## 2022-09-22 ENCOUNTER — Emergency Department (HOSPITAL_COMMUNITY): Payer: BC Managed Care – PPO

## 2022-09-22 ENCOUNTER — Emergency Department (HOSPITAL_COMMUNITY)
Admission: EM | Admit: 2022-09-22 | Discharge: 2022-09-23 | Disposition: A | Discharge status: Home / Self Care | Payer: BC Managed Care – PPO | Attending: Emergency Medicine | Admitting: Emergency Medicine

## 2022-09-22 ENCOUNTER — Encounter (HOSPITAL_COMMUNITY): Payer: Self-pay | Admitting: Emergency Medicine

## 2022-09-22 DIAGNOSIS — R519 Headache, unspecified: Secondary | ICD-10-CM | POA: Diagnosis present

## 2022-09-22 DIAGNOSIS — M79602 Pain in left arm: Secondary | ICD-10-CM | POA: Diagnosis not present

## 2022-09-22 DIAGNOSIS — M79601 Pain in right arm: Secondary | ICD-10-CM | POA: Diagnosis not present

## 2022-09-22 DIAGNOSIS — Y92410 Unspecified street and highway as the place of occurrence of the external cause: Secondary | ICD-10-CM | POA: Insufficient documentation
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
## 2022-09-29 ENCOUNTER — Other Ambulatory Visit: Payer: Self-pay | Admitting: Rheumatology

## 2022-09-29 DIAGNOSIS — L408 Other psoriasis: Secondary | ICD-10-CM

## 2022-09-29 DIAGNOSIS — Z79899 Other long term (current) drug therapy: Secondary | ICD-10-CM

## 2022-09-29 DIAGNOSIS — L4050 Arthropathic psoriasis, unspecified: Secondary | ICD-10-CM

## 2022-09-29 NOTE — Telephone Encounter (Signed)
Next Visit: 12/16/2022  Last Visit: 09/15/2022  Last Fill: 07/02/2022  [id number] arthritis   Current Dose per office note 09/15/2022: Cosentyx 300 mg sq injections every 4 weeks  Labs: 08/07/2022 CBC and CMP are stable  TB Gold: 01/12/2022 Neg    Okay to refill Cosentyx?

## 2022-09-30 ENCOUNTER — Encounter: Payer: Self-pay | Admitting: *Deleted

## 2022-11-10 ENCOUNTER — Other Ambulatory Visit: Payer: Self-pay | Admitting: *Deleted

## 2022-11-10 DIAGNOSIS — Z79899 Other long term (current) drug therapy: Secondary | ICD-10-CM

## 2022-11-11 LAB — COMPLETE METABOLIC PANEL WITH GFR
AG Ratio: 1.1 (calc) (ref 1.0–2.5)
ALT: 14 U/L (ref 6–29)
AST: 20 U/L (ref 10–35)
Albumin: 3.8 g/dL (ref 3.6–5.1)
Alkaline phosphatase (APISO): 80 U/L (ref 37–153)
BUN: 19 mg/dL (ref 7–25)
CO2: 30 mmol/L (ref 20–32)
Calcium: 10 mg/dL (ref 8.6–10.4)
Chloride: 104 mmol/L (ref 98–110)
Creat: 0.91 mg/dL (ref 0.50–1.05)
Globulin: 3.6 g/dL (calc) (ref 1.9–3.7)
Glucose, Bld: 84 mg/dL (ref 65–99)
Potassium: 5.5 mmol/L — ABNORMAL HIGH (ref 3.5–5.3)
Sodium: 141 mmol/L (ref 135–146)
Total Bilirubin: 0.4 mg/dL (ref 0.2–1.2)
Total Protein: 7.4 g/dL (ref 6.1–8.1)
eGFR: 70 mL/min/{1.73_m2} (ref >=60)

## 2022-11-11 LAB — CBC WITH DIFFERENTIAL/PLATELET
Absolute Monocytes: 400 cells/uL (ref 200–950)
Basophils Absolute: 38 cells/uL (ref 0–200)
Basophils Relative: 0.8 %
Eosinophils Absolute: 89 cells/uL (ref 15–500)
Eosinophils Relative: 1.9 %
HCT: 42.8 % (ref 35.0–45.0)
Hemoglobin: 13.6 g/dL (ref 11.7–15.5)
Lymphs Abs: 1772 cells/uL (ref 850–3900)
MCH: 25.1 pg — ABNORMAL LOW (ref 27.0–33.0)
MCHC: 31.8 g/dL — ABNORMAL LOW (ref 32.0–36.0)
MCV: 79.1 fL — ABNORMAL LOW (ref 80.0–100.0)
MPV: 12.2 fL (ref 7.5–12.5)
Monocytes Relative: 8.5 %
Neutro Abs: 2402 cells/uL (ref 1500–7800)
Neutrophils Relative %: 51.1 %
Platelets: 302 10*3/uL (ref 140–400)
RBC: 5.41 10*6/uL — ABNORMAL HIGH (ref 3.80–5.10)
RDW: 14 % (ref 11.0–15.0)
Total Lymphocyte: 37.7 %
WBC: 4.7 10*3/uL (ref 3.8–10.8)

## 2022-11-11 NOTE — Progress Notes (Signed)
CBC is normal.  CMP shows elevated potassium probably hemolyzed sample.  Please forward results to her PCP.
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
## 2022-12-09 ENCOUNTER — Other Ambulatory Visit (HOSPITAL_COMMUNITY): Payer: Self-pay

## 2022-12-09 ENCOUNTER — Telehealth: Payer: Self-pay | Admitting: Pharmacist

## 2022-12-09 DIAGNOSIS — L4050 Arthropathic psoriasis, unspecified: Secondary | ICD-10-CM

## 2022-12-09 DIAGNOSIS — L408 Other psoriasis: Secondary | ICD-10-CM

## 2022-12-09 DIAGNOSIS — Z79899 Other long term (current) drug therapy: Secondary | ICD-10-CM

## 2022-12-09 NOTE — Telephone Encounter (Signed)
Received notification from [facility] regarding a prior authorization for [place]. Authorization has been APPROVED from 12/09/2022 to 11/30/2023. Approval letter sent to scan center.  Per test claim, copay for 28 days supply is $100 as predicted. Will attach to PAP application  Phone # [phone]  Knox Saliva, PharmD, MPH, BCPS, CPP Clinical Pharmacist (Rheumatology and Pulmonology)

## 2022-12-09 NOTE — Telephone Encounter (Signed)
Received fax from [facility] with PA form for Cosentyx. Patient appears to have switched from [facility] to [place] (that [place] recipients qualify for). Patient's copay will likely be $100 once PA is approved  Completed PA form and faxed to [facility] with clinicals.  Phone: [phone] Fax: [phone]  I called patient to see if she'd be interested in [employer] PAP program. She believes she qualifies for PAP. She will stop by the clinic to sign the forms and bring her proof of income. Forms have been placed up front for patient to sign. Provider portion placed in Dr. Arlean Hopping folder for signature.   Patient advised that turnaround time for PAP is generally 7-10 business days.  Knox Saliva, PharmD, MPH, BCPS, CPP Clinical Pharmacist (Rheumatology and Pulmonology)

## 2022-12-10 NOTE — Telephone Encounter (Signed)
Received signed patient form and her income documents for [employer] PAP. Submission is pending provider signature still.  Knox Saliva, PharmD, MPH, BCPS, CPP Clinical Pharmacist (Rheumatology and Pulmonology)

## 2022-12-15 NOTE — Telephone Encounter (Signed)
Received a fax from  [employer] regarding an approval for [place] patient assistance from 12/15/2022 to 11/30/2023. Approval letter sent to scan center.  Phone number: [phone]  ATC patient but phone went straight to VM. Will ATC patient tomorrow morning  Knox Saliva, PharmD, MPH, BCPS, CPP Clinical Pharmacist (Rheumatology and Pulmonology)

## 2022-12-15 NOTE — Telephone Encounter (Signed)
Submitted Patient Assistance Application to [employer] for [employer] along with provider portion, pt portion, PA, med list, insurance card copy, and income documents. Will update patient when we receive a response.  Fax# [phone] Phone# [phone]  Knox Saliva, PharmD, MPH, BCPS, CPP Clinical Pharmacist (Rheumatology and Pulmonology)

## 2022-12-16 ENCOUNTER — Ambulatory Visit: Payer: Medicare PPO | Attending: Physician Assistant | Admitting: Physician Assistant

## 2022-12-16 ENCOUNTER — Encounter: Payer: Self-pay | Admitting: Physician Assistant

## 2022-12-16 VITALS — BP 106/74 | HR 73 | Resp 14 | Ht 64.0 in | Wt 119.0 lb

## 2022-12-16 DIAGNOSIS — M24521 Contracture, right elbow: Secondary | ICD-10-CM

## 2022-12-16 DIAGNOSIS — L4050 Arthropathic psoriasis, unspecified: Secondary | ICD-10-CM

## 2022-12-16 DIAGNOSIS — Z79899 Other long term (current) drug therapy: Secondary | ICD-10-CM | POA: Diagnosis not present

## 2022-12-16 DIAGNOSIS — N29 Other disorders of kidney and ureter in diseases classified elsewhere: Secondary | ICD-10-CM

## 2022-12-16 DIAGNOSIS — L408 Other psoriasis: Secondary | ICD-10-CM

## 2022-12-16 DIAGNOSIS — E559 Vitamin D deficiency, unspecified: Secondary | ICD-10-CM

## 2022-12-16 DIAGNOSIS — M810 Age-related osteoporosis without current pathological fracture: Secondary | ICD-10-CM

## 2022-12-16 DIAGNOSIS — I10 Essential (primary) hypertension: Secondary | ICD-10-CM

## 2022-12-16 DIAGNOSIS — Z111 Encounter for screening for respiratory tuberculosis: Secondary | ICD-10-CM

## 2022-12-16 MED ORDER — COSENTYX SENSOREADY (300 MG) 150 MG/ML ~~LOC~~ SOAJ: 300.0000 mg | SUBCUTANEOUS | 2 refills | Status: DC

## 2022-12-16 NOTE — Patient Instructions (Signed)
Standing Labs We placed an order today for your standing lab work.   Please have your standing labs drawn in March and every 3 months  Please have your labs drawn 2 weeks prior to your appointment so that the provider can discuss your lab results at your appointment.  Please note that you may see your imaging and lab results in MyChart before we have reviewed them. We will contact you once all results are reviewed. Please allow our office up to 72 hours to thoroughly review all of the results before contacting the office for clarification of your results.  Lab hours are:   Monday through Thursday from 8:00 am -12:30 pm and 1:00 pm-5:00 pm and Friday from 8:00 am-12:00 pm.  Please be advised, all patients with office appointments requiring lab work will take precedent over walk-in lab work.   Labs are drawn by [facility]. Please bring your co-pay at the time of your lab draw.  You may receive a bill from [facility] for your lab work.  Please note if you are on Hydroxychloroquine and and an order has been placed for a Hydroxychloroquine level, you will need to have it drawn 4 hours or more after your last dose.  If you wish to have your labs drawn at another location, please call the office 24 hours in advance so we can fax the orders.  The office is located at [address], [address], [place], [place] [address] No appointment is necessary.    If you have any questions regarding directions or hours of operation,  please call [phone].   As a reminder, please drink plenty of water prior to coming for your lab work. Thanks!  

## 2022-12-16 NOTE — Telephone Encounter (Signed)
Patient had OV today with [name], PA-C. Advised her of PAP approval for Cosentyx and that we will have to re-apply again at the end of the year. She verbalized understanding. She states she already talked to company and they will be trying to get medication to her by Starla Link, PharmD, MPH, BCPS, CPP Clinical Pharmacist (Rheumatology and Pulmonology)

## 2022-12-18 ENCOUNTER — Other Ambulatory Visit (HOSPITAL_COMMUNITY): Payer: Self-pay

## 2022-12-31 ENCOUNTER — Telehealth: Payer: Self-pay | Admitting: *Deleted

## 2022-12-31 NOTE — Telephone Encounter (Signed)
Patient contacted the office and states she has recently started on Cosentyx. Patient states her schedule for injecting was thrown off. Patient states she would like a call back to clarify when she should inject her Cosentyx. Patient states she had her last injection on 12/22/2022.

## 2023-01-01 NOTE — Telephone Encounter (Signed)
Patient is to continue Cosentyx SQ 300 mg SQ every 4 weeks. Next dose is due 01/19/2023 and every 4 weeks thereafter  Phone went straight to VM. Left detailed VM advising. Nothing further needed  Knox Saliva, PharmD, MPH, BCPS, CPP Clinical Pharmacist (Rheumatology and Pulmonology)

## 2023-01-14 ENCOUNTER — Ambulatory Visit: Payer: BC Managed Care – PPO | Admitting: Dermatology

## 2023-01-28 ENCOUNTER — Ambulatory Visit: Payer: BC Managed Care – PPO | Admitting: Dermatology

## 2023-02-01 ENCOUNTER — Other Ambulatory Visit: Payer: Self-pay | Admitting: *Deleted

## 2023-02-01 DIAGNOSIS — Z111 Encounter for screening for respiratory tuberculosis: Secondary | ICD-10-CM

## 2023-02-01 DIAGNOSIS — Z79899 Other long term (current) drug therapy: Secondary | ICD-10-CM

## 2023-02-02 ENCOUNTER — Other Ambulatory Visit: Payer: Self-pay | Admitting: *Deleted

## 2023-02-02 DIAGNOSIS — L408 Other psoriasis: Secondary | ICD-10-CM

## 2023-02-02 DIAGNOSIS — Z79899 Other long term (current) drug therapy: Secondary | ICD-10-CM

## 2023-02-02 DIAGNOSIS — L4050 Arthropathic psoriasis, unspecified: Secondary | ICD-10-CM

## 2023-02-02 NOTE — Progress Notes (Signed)
CMP WNL.  WBC count is slightly low-3.5.  absolute monocytes are low.  Rest of CBC stable.  Recommend rechecking CBC with diff in 1 month.

## 2023-02-03 ENCOUNTER — Other Ambulatory Visit: Payer: Self-pay | Admitting: *Deleted

## 2023-02-03 DIAGNOSIS — L4050 Arthropathic psoriasis, unspecified: Secondary | ICD-10-CM

## 2023-02-03 DIAGNOSIS — L408 Other psoriasis: Secondary | ICD-10-CM

## 2023-02-03 DIAGNOSIS — Z79899 Other long term (current) drug therapy: Secondary | ICD-10-CM

## 2023-02-03 LAB — COMPLETE METABOLIC PANEL WITH GFR
AG Ratio: 1.2 (calc) (ref 1.0–2.5)
ALT: 14 U/L (ref 6–29)
AST: 21 U/L (ref 10–35)
Albumin: 4.1 g/dL (ref 3.6–5.1)
Alkaline phosphatase (APISO): 79 U/L (ref 37–153)
BUN: 13 mg/dL (ref 7–25)
CO2: 27 mmol/L (ref 20–32)
Calcium: 9.5 mg/dL (ref 8.6–10.4)
Chloride: 102 mmol/L (ref 98–110)
Creat: 0.85 mg/dL (ref 0.50–1.05)
Globulin: 3.5 g/dL (calc) (ref 1.9–3.7)
Glucose, Bld: 97 mg/dL (ref 65–99)
Potassium: 4.4 mmol/L (ref 3.5–5.3)
Sodium: 139 mmol/L (ref 135–146)
Total Bilirubin: 0.4 mg/dL (ref 0.2–1.2)
Total Protein: 7.6 g/dL (ref 6.1–8.1)
eGFR: 76 mL/min/{1.73_m2} (ref >=60)

## 2023-02-03 LAB — CBC WITH DIFFERENTIAL/PLATELET
Absolute Monocytes: 179 cells/uL — ABNORMAL LOW (ref 200–950)
Basophils Absolute: 39 cells/uL (ref 0–200)
Basophils Relative: 1.1 %
Eosinophils Absolute: 98 cells/uL (ref 15–500)
Eosinophils Relative: 2.8 %
HCT: 43.9 % (ref 35.0–45.0)
Hemoglobin: 13.6 g/dL (ref 11.7–15.5)
Lymphs Abs: 1502 cells/uL (ref 850–3900)
MCH: 24.2 pg — ABNORMAL LOW (ref 27.0–33.0)
MCHC: 31 g/dL — ABNORMAL LOW (ref 32.0–36.0)
MCV: 78.3 fL — ABNORMAL LOW (ref 80.0–100.0)
MPV: 12.2 fL (ref 7.5–12.5)
Monocytes Relative: 5.1 %
Neutro Abs: 1684 cells/uL (ref 1500–7800)
Neutrophils Relative %: 48.1 %
Platelets: 267 10*3/uL (ref 140–400)
RBC: 5.61 10*6/uL — ABNORMAL HIGH (ref 3.80–5.10)
RDW: 13.6 % (ref 11.0–15.0)
Total Lymphocyte: 42.9 %
WBC: 3.5 10*3/uL — ABNORMAL LOW (ref 3.8–10.8)

## 2023-02-03 LAB — QUANTIFERON-TB GOLD PLUS
Mitogen-NIL: 8.66 IU/mL
NIL: 0.02 IU/mL
QuantiFERON-TB Gold Plus: NEGATIVE
TB1-NIL: 0 IU/mL
TB2-NIL: 0 IU/mL

## 2023-02-03 MED ORDER — COSENTYX SENSOREADY (300 MG) 150 MG/ML ~~LOC~~ SOAJ: 300.0000 mg | SUBCUTANEOUS | 2 refills | Status: DC

## 2023-02-03 NOTE — Telephone Encounter (Signed)
Next Visit: 03/17/2023  Last Visit: 12/16/2022  Last Fill: 12/16/2022  DX: Psoriatic arthritis   Current Dose per office note 12/16/2022: Cosentyx 300 mg sq injections every 4 weeks   Labs: 02/01/2023 CMP WNL. WBC count is slightly low-3.5.  absolute monocytes are low.  Rest of CBC stable. Recommend rechecking CBC with diff in 1 month.  TB Gold: 02/01/2023 in process   Okay to refill Cosentyx?

## 2023-02-04 NOTE — Progress Notes (Signed)
TB gold negative

## 2023-02-10 ENCOUNTER — Other Ambulatory Visit: Payer: Self-pay

## 2023-02-10 DIAGNOSIS — L408 Other psoriasis: Secondary | ICD-10-CM

## 2023-02-10 DIAGNOSIS — Z79899 Other long term (current) drug therapy: Secondary | ICD-10-CM

## 2023-02-10 DIAGNOSIS — L4050 Arthropathic psoriasis, unspecified: Secondary | ICD-10-CM

## 2023-02-10 MED ORDER — COSENTYX SENSOREADY (300 MG) 150 MG/ML ~~LOC~~ SOAJ: 300.0000 mg | SUBCUTANEOUS | 2 refills | Status: DC

## 2023-02-10 NOTE — Telephone Encounter (Signed)
Prescription was placed as a no print. Received another refill request from [facility]. Resent prescription to the pharmacy.

## 2023-03-01 ENCOUNTER — Other Ambulatory Visit: Payer: Self-pay | Admitting: *Deleted

## 2023-03-01 DIAGNOSIS — Z79899 Other long term (current) drug therapy: Secondary | ICD-10-CM

## 2023-03-01 DIAGNOSIS — L4050 Arthropathic psoriasis, unspecified: Secondary | ICD-10-CM

## 2023-03-01 DIAGNOSIS — L408 Other psoriasis: Secondary | ICD-10-CM

## 2023-03-01 LAB — CBC WITH DIFFERENTIAL/PLATELET
Absolute Monocytes: 338 cells/uL (ref 200–950)
Basophils Absolute: 38 cells/uL (ref 0–200)
Basophils Relative: 0.8 %
Eosinophils Absolute: 160 cells/uL (ref 15–500)
Eosinophils Relative: 3.4 %
HCT: 42 % (ref 35.0–45.0)
Hemoglobin: 13 g/dL (ref 11.7–15.5)
Lymphs Abs: 1795.4 cells/uL (ref 850–3900)
MCH: 24.5 pg — ABNORMAL LOW (ref 27.0–33.0)
MCHC: 31 g/dL — ABNORMAL LOW (ref 32.0–36.0)
MCV: 79.2 fL — ABNORMAL LOW (ref 80.0–100.0)
MPV: 12 fL (ref 7.5–12.5)
Monocytes Relative: 7.2 %
Neutro Abs: 2369 cells/uL (ref 1500–7800)
Neutrophils Relative %: 50.4 %
Platelets: 295 10*3/uL (ref 140–400)
RBC: 5.3 10*6/uL — ABNORMAL HIGH (ref 3.80–5.10)
RDW: 14.1 % (ref 11.0–15.0)
Total Lymphocyte: 38.2 %
WBC: 4.7 10*3/uL (ref 3.8–10.8)

## 2023-03-02 NOTE — Progress Notes (Signed)
CBC is stable.

## 2023-03-03 NOTE — Progress Notes (Unsigned)
Office Visit Note  Patient: Taylor Kirby             Date of Birth: 12/06/1955           MRN: [id number]             PCP: Laurann Montana, MD Referring: Laurann Montana, MD Visit Date: 03/17/2023 Occupation: @GUAROCC @  Subjective:  Fatigue   History of Present Illness: Taylor Kirby is a 67 y.o. female with history of psoriatic arthritis and osteoarthritis.  Patient is currently on Cosentyx 300 mg sq injections every 4 weeks.  She is tolerating Cosentyx without any side effects or injection site reactions.  She has not missed any doses recently.  She denies any signs or symptoms of a psoriatic arthritis flare.  She denies any Achilles tendinitis or plantar fasciitis.  She denies any joint pain or joint swelling.  She denies any morning stiffness or nocturnal pain.  She denies any difficulty with ADLs.  She denies any SI joint discomfort.  She denies any active psoriasis.  She has not needed to use clobetasol cream recently.  She has been eating better and sleeping better at night.  She continues to experience some daytime fatigue. She denies any new medical conditions.   She denies any recent or recurrent flares.    Activities of Daily Living:  Patient reports morning stiffness for 0 minutes.   Patient Denies nocturnal pain.  Difficulty dressing/grooming: Denies Difficulty climbing stairs: Denies Difficulty getting out of chair: Denies Difficulty using hands for taps, buttons, cutlery, and/or writing: Denies  Review of Systems  Constitutional:  Positive for fatigue.  HENT:  Negative for mouth sores and mouth dryness.   Eyes:  Negative for dryness.  Respiratory:  Negative for shortness of breath.   Cardiovascular:  Negative for chest pain and palpitations.  Gastrointestinal:  Negative for blood in stool, constipation and diarrhea.  Endocrine: Negative for increased urination.  Genitourinary:  Negative for involuntary urination.  Musculoskeletal:  Negative for joint pain, gait  problem, joint pain, joint swelling, myalgias, muscle weakness, morning stiffness, muscle tenderness and myalgias.  Skin:  Negative for color change, rash, hair loss and sensitivity to sunlight.  Allergic/Immunologic: Negative for susceptible to infections.  Neurological:  Negative for dizziness and headaches.  Hematological:  Negative for swollen glands.  Psychiatric/Behavioral:  Negative for depressed mood and sleep disturbance. The patient is not nervous/anxious.     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   High risk medication use 10/03/2016   Psoriatic arthritis 10/25/2015    Past Medical History:  Diagnosis Date   Allergy    Anemia    Chronic kidney disease    Heart murmur    Osteoporosis    Rheumatoid arthritis     Family History  Problem Relation Age of Onset   Hyperlipidemia Mother    Hypertension Mother    Stroke Mother    Hyperlipidemia Father    Cancer Father    Heart attack Father    Hypertension Sister    Heart disease Maternal Grandmother    Cancer Maternal Grandfather    Hypertension Paternal Grandmother    Past Surgical History:  Procedure Laterality Date   TUBAL LIGATION     Social History   Social History Narrative   Not on file  Immunization History  Administered Date(s) Administered   [employer] Booster Vaccination 11/19/2020   Moderna Sars-Covid-2 Vaccination 01/27/2020, 02/24/2020     Objective: Vital Signs: BP 96/64 (BP Location: Left Arm, Patient Position: Sitting, Cuff Size: Normal)   Pulse 69   Resp 13   Ht 5\' 4"  (1.626 m)   Wt 115 lb 12.8 oz (52.5 kg)   BMI 19.88 kg/m    Physical Exam Vitals and nursing note reviewed.  Constitutional:      Appearance: She is well-developed.  HENT:     Head: Normocephalic and atraumatic.  Eyes:      Conjunctiva/sclera: Conjunctivae normal.  Cardiovascular:     Rate and Rhythm: Normal rate and regular rhythm.     Heart sounds: Normal heart sounds.  Pulmonary:     Effort: Pulmonary effort is normal.     Breath sounds: Normal breath sounds.  Abdominal:     General: Bowel sounds are normal.     Palpations: Abdomen is soft.  Musculoskeletal:     Cervical back: Normal range of motion.  Skin:    General: Skin is warm and dry.     Capillary Refill: Capillary refill takes less than 2 seconds.     Comments: Fingernail pitting noted in left 3rd fingernail.   Neurological:     Mental Status: She is alert and oriented to person, place, and time.  Psychiatric:        Behavior: Behavior normal.      Musculoskeletal Exam: C-spine, thoracic spine, lumbar spine have good range of motion.  No midline spinal tenderness or SI joint tenderness.  Shoulder joints have good range of motion.  Right elbow joint flexion contracture unchanged.  Left elbow has good range of motion.  No tenderness along the elbow joint line.  Limited extension of both wrist joints.  Arthritis mutilans of bilateral thumbs noted.  PIP and DIP thickening with subluxation.  No synovitis or dactylitis noted.  Hip joints have good range of motion with no groin pain.  Knee joints have good range of motion with no warmth or effusion.  Ankle joints have good range of motion with no tenderness or joint swelling.  No evidence of Achilles tendinitis or plantar fasciitis.  CDAI Exam: CDAI Score: -- Patient Global: --; Provider Global: -- Swollen: --; Tender: -- Joint Exam 03/17/2023   No joint exam has been documented for this visit   There is currently no information documented on the homunculus. Go to the Rheumatology activity and complete the homunculus joint exam.  Investigation: No additional findings.  Imaging: No results found.  Recent Labs: Lab Results  Component Value Date   WBC 4.7 03/01/2023   HGB 13.0 03/01/2023    PLT 295 03/01/2023   NA 139 02/01/2023   K 4.4 02/01/2023   CL 102 02/01/2023   CO2 27 02/01/2023   GLUCOSE 97 02/01/2023   BUN 13 02/01/2023   CREATININE 0.85 02/01/2023   BILITOT 0.4 02/01/2023   ALKPHOS 95 02/04/2018   AST 21 02/01/2023   ALT 14 02/01/2023   PROT 7.6 02/01/2023   ALBUMIN 3.7 02/04/2018   CALCIUM 9.5 02/01/2023   GFRAA 85 04/10/2021   QFTBGOLDPLUS NEGATIVE 02/01/2023    Speciality Comments: Discontinued methotrexate September 2021 due to low WBC Fosamax March 2019 till March 2023 with few gaps.  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  She has not had any signs or symptoms of a flare while on Cosentyx.  No evidence of enthesitis noted.  She has not had any signs or symptoms of uveitis.  No active psoriasis at this time.  No SI joint tenderness upon palpation.  She remains on Cosentyx 300 mg subcutaneous injections every 4 weeks.  She is tolerating Cosentyx without any side effects or injection site reactions.  She has not had any recent or recurrent infections. She will remain on Cosentyx as prescribed.  She was advised to notify us if she develops signs or symptoms of a flare.  She will follow-up in the office in 3 months or sooner if needed.  Other psoriasis: No active psoriasis at this time.  She will remain on Cosentyx as prescribed.  She does not need a refill of clobetasol cream at this time.  High risk medication use - Cosentyx 300 mg sq injections every 4 weeks. Initiated Cosentyx on 06/30/22. TB gold negative on 02/01/23.  CBC updated on 03/01/23. CMP updated on 02/01/23.  She will be due to update lab work in June and every 3 months to monitor for drug toxicity.  Standing orders for CBC and CMP remain in place. No recent or  recurrent flares.  Discussed the importance of holding cosentyx if she develops signs or symptoms of an infection and to resume once the infection has completely cleared.   Contracture of elbow joint, right: Unchanged.  No tenderness or inflammation noted.  Age-related osteoporosis without current pathological fracture - Fosamax was discontinued in March 2023 after her repeat DEXA scan  DEXA updated on 01/28/2022: Lumbar spine BMD 0.831 with T score -2.1. Due to update DEXA in March 2025.  Vitamin D deficiency - Vitamin D will be updated today.  Plan: VITAMIN D 25 Hydroxy (Vit-D Deficiency, Fractures)  History of anemia - She has been experiencing ongoing daytime fatigue.  MCV, MCH, and MCHC were low on 03/01/2023.  Iron panel will be obtained today for further evaluation.  Plan: Fe+TIBC+Fer, B12 and Folate Panel  Vitamin B12 deficiency -vitamin B12 and folate panel will be checked today.  Plan: B12 and Folate Panel  Other medical conditions are listed as follows:  Renal calcinosis  Essential hypertension: Blood pressure was 96/64 today in the office.    Orders: Orders Placed This Encounter  Procedures   VITAMIN D 25 Hydroxy (Vit-D Deficiency, Fractures)   Fe+TIBC+Fer   B12 and Folate Panel   No orders of the defined types were placed in this encounter.  Follow-Up Instructions: Return in about 3 months (around 06/16/2023) for Psoriatic arthritis, Osteoarthritis.   Gearldine Bienenstock, PA-C  Note - This record has been created using Dragon software.  Chart creation errors have been sought, but may not always  have been located. Such creation errors do not reflect on  the standard of medical care.

## 2023-03-17 ENCOUNTER — Ambulatory Visit: Payer: Medicare PPO | Attending: Physician Assistant | Admitting: Physician Assistant

## 2023-03-17 ENCOUNTER — Encounter: Payer: Self-pay | Admitting: Physician Assistant

## 2023-03-17 VITALS — BP 96/64 | HR 69 | Resp 13 | Ht 64.0 in | Wt 115.8 lb

## 2023-03-17 DIAGNOSIS — Z862 Personal history of diseases of the blood and blood-forming organs and certain disorders involving the immune mechanism: Secondary | ICD-10-CM

## 2023-03-17 DIAGNOSIS — M24521 Contracture, right elbow: Secondary | ICD-10-CM

## 2023-03-17 DIAGNOSIS — L408 Other psoriasis: Secondary | ICD-10-CM

## 2023-03-17 DIAGNOSIS — Z79899 Other long term (current) drug therapy: Secondary | ICD-10-CM

## 2023-03-17 DIAGNOSIS — M810 Age-related osteoporosis without current pathological fracture: Secondary | ICD-10-CM

## 2023-03-17 DIAGNOSIS — I10 Essential (primary) hypertension: Secondary | ICD-10-CM

## 2023-03-17 DIAGNOSIS — E559 Vitamin D deficiency, unspecified: Secondary | ICD-10-CM

## 2023-03-17 DIAGNOSIS — L4050 Arthropathic psoriasis, unspecified: Secondary | ICD-10-CM

## 2023-03-17 DIAGNOSIS — N29 Other disorders of kidney and ureter in diseases classified elsewhere: Secondary | ICD-10-CM

## 2023-03-17 DIAGNOSIS — E538 Deficiency of other specified B group vitamins: Secondary | ICD-10-CM

## 2023-03-17 NOTE — Patient Instructions (Signed)
Standing Labs We placed an order today for your standing lab work.   Please have your standing labs drawn in June and every 3 months   Please have your labs drawn 2 weeks prior to your appointment so that the provider can discuss your lab results at your appointment, if possible.  Please note that you may see your imaging and lab results in MyChart before we have reviewed them. We will contact you once all results are reviewed. Please allow our office up to 72 hours to thoroughly review all of the results before contacting the office for clarification of your results.  WALK-IN LAB HOURS  Monday through Thursday from 8:00 am -12:30 pm and 1:00 pm-5:00 pm and Friday from 8:00 am-12:00 pm.  Patients with office visits requiring labs will be seen before walk-in labs.  You may encounter longer than normal wait times. Please allow additional time. Wait times may be shorter on  Monday and Thursday afternoons.  We do not book appointments for walk-in labs. We appreciate your patience and understanding with our staff.   Labs are drawn by [facility]. Please bring your co-pay at the time of your lab draw.  You may receive a bill from [facility] for your lab work.  Please note if you are on Hydroxychloroquine and and an order has been placed for a Hydroxychloroquine level,  you will need to have it drawn 4 hours or more after your last dose.  If you wish to have your labs drawn at another location, please call the office 24 hours in advance so we can fax the orders.  The office is located at [address], [address], [place], [place] [address]   If you have any questions regarding directions or hours of operation,  please call [phone].   As a reminder, please drink plenty of water prior to coming for your lab work. Thanks!  

## 2023-03-18 LAB — B12 AND FOLATE PANEL
Folate: >24 ng/mL
Vitamin B-12: 577 pg/mL (ref 200–1100)

## 2023-03-18 LAB — IRON,TIBC AND FERRITIN PANEL
%SAT: 27 % (calc) (ref 16–45)
Ferritin: 29 ng/mL (ref 16–288)
Iron: 86 ug/dL (ref 45–160)
TIBC: 324 mcg/dL (calc) (ref 250–450)

## 2023-03-18 LAB — VITAMIN D 25 HYDROXY (VIT D DEFICIENCY, FRACTURES): Vit D, 25-Hydroxy: 41 ng/mL (ref 30–100)

## 2023-03-18 NOTE — Progress Notes (Signed)
Vitamin B12, folate, vitamin D, and iron panel WNL

## 2023-03-31 LAB — LAB REPORT - SCANNED: Creatinine, POC: 81 mg/dL

## 2023-05-13 DIAGNOSIS — I129 Hypertensive chronic kidney disease with stage 1 through stage 4 chronic kidney disease, or unspecified chronic kidney disease: Secondary | ICD-10-CM | POA: Diagnosis not present

## 2023-05-13 DIAGNOSIS — N182 Chronic kidney disease, stage 2 (mild): Secondary | ICD-10-CM | POA: Diagnosis not present

## 2023-05-20 DIAGNOSIS — L4050 Arthropathic psoriasis, unspecified: Secondary | ICD-10-CM | POA: Diagnosis not present

## 2023-05-20 DIAGNOSIS — B029 Zoster without complications: Secondary | ICD-10-CM | POA: Diagnosis not present

## 2023-05-25 ENCOUNTER — Other Ambulatory Visit: Payer: Self-pay

## 2023-05-25 DIAGNOSIS — Z79899 Other long term (current) drug therapy: Secondary | ICD-10-CM

## 2023-05-25 DIAGNOSIS — L4050 Arthropathic psoriasis, unspecified: Secondary | ICD-10-CM

## 2023-05-25 DIAGNOSIS — L408 Other psoriasis: Secondary | ICD-10-CM

## 2023-05-25 MED ORDER — COSENTYX SENSOREADY (300 MG) 150 MG/ML ~~LOC~~ SOAJ: 300.0000 mg | SUBCUTANEOUS | 2 refills | Status: DC

## 2023-05-25 NOTE — Telephone Encounter (Signed)
Patient called requesting a refill of cosentyx to [facility] pharmacy. I advised patient that she is due to update labs. Patient states she currently has shingles and is on treatment. I advised patient to ensure she holds cosentyx while on treatment for shingles and until they resolve. Patient verbalized understanding. Patient states she was advised they have resolved once they scab over. Patient will update labs asap once shingles have scabbed over completely.   Last Fill: 02/10/2023  Labs: 02/01/2023 CMP WNL  03/01/2023 CBC: RBC 5.30, MCV 79.2, MCH 24.5, MCHC 31.0  TB Gold: 02/01/2023 negative    Next Visit: 06/16/2023  Last Visit: 03/17/2023  RS:[id number] arthritis   Current Dose per office note on 03/17/2023: Cosentyx 300 mg sq injections every 4 weeks.   Okay to refill Cosentyx?

## 2023-05-31 ENCOUNTER — Other Ambulatory Visit: Payer: Self-pay | Admitting: *Deleted

## 2023-05-31 DIAGNOSIS — Z79899 Other long term (current) drug therapy: Secondary | ICD-10-CM

## 2023-06-01 LAB — CBC WITH DIFFERENTIAL/PLATELET
Absolute Monocytes: 257 cells/uL (ref 200–950)
Basophils Absolute: 31 cells/uL (ref 0–200)
Basophils Relative: 0.8 %
Eosinophils Absolute: 78 cells/uL (ref 15–500)
Eosinophils Relative: 2 %
HCT: 41.5 % (ref 35.0–45.0)
Hemoglobin: 12.9 g/dL (ref 11.7–15.5)
Lymphs Abs: 1521 cells/uL (ref 850–3900)
MCH: 24.6 pg — ABNORMAL LOW (ref 27.0–33.0)
MCHC: 31.1 g/dL — ABNORMAL LOW (ref 32.0–36.0)
MCV: 79.2 fL — ABNORMAL LOW (ref 80.0–100.0)
MPV: 11.7 fL (ref 7.5–12.5)
Monocytes Relative: 6.6 %
Neutro Abs: 2012 cells/uL (ref 1500–7800)
Neutrophils Relative %: 51.6 %
Platelets: 300 10*3/uL (ref 140–400)
RBC: 5.24 10*6/uL — ABNORMAL HIGH (ref 3.80–5.10)
RDW: 14.5 % (ref 11.0–15.0)
Total Lymphocyte: 39 %
WBC: 3.9 10*3/uL (ref 3.8–10.8)

## 2023-06-01 LAB — COMPLETE METABOLIC PANEL WITH GFR
AG Ratio: 1.1 (calc) (ref 1.0–2.5)
ALT: 17 U/L (ref 6–29)
AST: 20 U/L (ref 10–35)
Albumin: 4 g/dL (ref 3.6–5.1)
Alkaline phosphatase (APISO): 74 U/L (ref 37–153)
BUN: 15 mg/dL (ref 7–25)
CO2: 32 mmol/L (ref 20–32)
Calcium: 10 mg/dL (ref 8.6–10.4)
Chloride: 100 mmol/L (ref 98–110)
Creat: 0.77 mg/dL (ref 0.50–1.05)
Globulin: 3.5 g/dL (calc) (ref 1.9–3.7)
Glucose, Bld: 88 mg/dL (ref 65–99)
Potassium: 4.2 mmol/L (ref 3.5–5.3)
Sodium: 138 mmol/L (ref 135–146)
Total Bilirubin: 0.4 mg/dL (ref 0.2–1.2)
Total Protein: 7.5 g/dL (ref 6.1–8.1)
eGFR: 86 mL/min/{1.73_m2} (ref >=60)

## 2023-06-01 NOTE — Progress Notes (Signed)
CMP WNL. CBC stable.

## 2023-06-02 ENCOUNTER — Telehealth: Payer: Self-pay

## 2023-06-02 NOTE — Telephone Encounter (Signed)
Patient contacted the office and states she needs a refill of Cosentyx. Advised the patient a refill of Cosentyx was sent to [facility] pharmacy on 05/25/2023. Advised the patient we got a receipt from the pharmacy. Advised the patient to contact her pharmacy. Patient verbalized understanding.

## 2023-06-02 NOTE — Progress Notes (Unsigned)
Office Visit Note  Patient: Taylor Kirby             Date of Birth: September 09, 1956           MRN: [id number]             PCP: Laurann Montana, MD Referring: Laurann Montana, MD Visit Date: 06/16/2023 Occupation: @GUAROCC @  Subjective:  Medication monitoring   History of Present Illness: Taylor Kirby is a 67 y.o. female with history of psoriatic arthritis and osteoporosis.  Patient remains on Cosentyx 300 mg sq injections every 4 weeks.  She is tolerating cosentyx without any side effects.  Patient reports she had shingles 3-4 weeks ago which cleared with the use of valtrex.  She had to postpone her cosentyx dose for a few days but denies any other recurrent infections.  She denies any signs or symptoms of a psoriatic arthritis flare.  She denies any psoriasis at this time.  She denies any Achilles tendinitis or plantar fasciitis.  She denies any SI joint pain.  She has not been experiencing any morning stiffness, nocturnal pain, or difficulty with ADLs.   Activities of Daily Living:  Patient reports morning stiffness for 0 minutes.   Patient Denies nocturnal pain.  Difficulty dressing/grooming: Denies Difficulty climbing stairs: Denies Difficulty getting out of chair: Denies Difficulty using hands for taps, buttons, cutlery, and/or writing: Denies  Review of Systems  Constitutional:  Positive for fatigue.  HENT:  Negative for mouth sores and mouth dryness.   Eyes:  Negative for dryness.  Respiratory:  Negative for shortness of breath.   Cardiovascular:  Negative for chest pain and palpitations.  Gastrointestinal:  Negative for blood in stool, constipation and diarrhea.  Endocrine: Negative for increased urination.  Genitourinary:  Negative for involuntary urination.  Musculoskeletal:  Negative for joint pain, gait problem, joint pain, joint swelling, myalgias, muscle weakness, morning stiffness, muscle tenderness and myalgias.  Skin:  Negative for color change, rash, hair loss  and sensitivity to sunlight.  Allergic/Immunologic: Negative for susceptible to infections.  Neurological:  Negative for dizziness and headaches.  Hematological:  Negative for swollen glands.  Psychiatric/Behavioral:  Negative for depressed mood and sleep disturbance. The patient is not nervous/anxious.     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   High risk medication use 10/03/2016   Psoriatic arthritis (HCC) 10/25/2015    Past Medical History:  Diagnosis Date   Allergy    Anemia    Chronic kidney disease    Heart murmur    Osteoporosis    Rheumatoid arthritis (HCC)     Family History  Problem Relation Age of Onset   Hyperlipidemia Mother    Hypertension Mother    Stroke Mother    Hyperlipidemia Father    Cancer Father    Heart attack Father    Hypertension Sister    Heart disease Maternal Grandmother    Cancer Maternal Grandfather    Hypertension Paternal Grandmother    Past Surgical History:  Procedure Laterality Date   TUBAL LIGATION     Social History   Social History Narrative   Not on file   Immunization History  Administered Date(s) Administered   Moderna SARS-COV2 Booster Vaccination 11/19/2020   Moderna Sars-Covid-2 Vaccination 01/27/2020, 02/24/2020     Objective: Vital Signs: BP  96/64 (BP Location: Left Arm, Patient Position: Sitting, Cuff Size: Normal)   Pulse 66   Resp 14   Ht 5\' 4"  (1.626 m)   Wt 114 lb (51.7 kg)   BMI 19.57 kg/m    Physical Exam Vitals and nursing note reviewed.  Constitutional:      Appearance: She is well-developed.  HENT:     Head: Normocephalic and atraumatic.  Eyes:     Conjunctiva/sclera: Conjunctivae normal.  Cardiovascular:     Rate and Rhythm: Normal rate and regular rhythm.     Heart sounds: Normal heart sounds.  Pulmonary:      Effort: Pulmonary effort is normal.     Breath sounds: Normal breath sounds.  Abdominal:     General: Bowel sounds are normal.     Palpations: Abdomen is soft.  Musculoskeletal:     Cervical back: Normal range of motion.  Lymphadenopathy:     Cervical: No cervical adenopathy.  Skin:    General: Skin is warm and dry.     Capillary Refill: Capillary refill takes less than 2 seconds.  Neurological:     Mental Status: She is alert and oriented to person, place, and time.  Psychiatric:        Behavior: Behavior normal.      Musculoskeletal Exam: C-spine, thoracic spine, lumbar spine have good range of motion with no discomfort.  No midline spinal tenderness.  No SI joint tenderness.  Shoulder joints have good range of motion with no discomfort or tenderness.  Right elbow joint flexion contracture unchanged.  Left elbow has good range of motion with no tenderness or inflammation.  Limited extension of both wrist joints.  Arthritis medial hands of bilateral thumbs.  PIP and DIP thickening with subluxation.  No synovitis or dactylitis noted.  Hip joints have good range of motion with no groin pain.  Knee joints have good range of motion with no warmth or effusion.  Ankle joints have good range of motion with no tenderness or joint swelling.  No evidence of Achilles tendinitis or plantar fasciitis.  No tenderness or synovitis over MTP joints.  CDAI Exam: CDAI Score: -- Patient Global: --; Provider Global: -- Swollen: --; Tender: -- Joint Exam 06/16/2023   No joint exam has been documented for this visit   There is currently no information documented on the homunculus. Go to the Rheumatology activity and complete the homunculus joint exam.  Investigation: No additional findings.  Imaging: No results found.  Recent Labs: Lab Results  Component Value Date   WBC 3.9 05/31/2023   HGB 12.9 05/31/2023   PLT 300 05/31/2023   NA 138 05/31/2023   K 4.2 05/31/2023   CL 100 05/31/2023    CO2 32 05/31/2023   GLUCOSE 88 05/31/2023   BUN 15 05/31/2023   CREATININE 0.77 05/31/2023   BILITOT 0.4 05/31/2023   ALKPHOS 95 02/04/2018   AST 20 05/31/2023   ALT 17 05/31/2023   PROT 7.5 05/31/2023   ALBUMIN 3.7 02/04/2018   CALCIUM 10.0 05/31/2023   GFRAA 85 04/10/2021   QFTBGOLDPLUS NEGATIVE 02/01/2023    Speciality Comments: Discontinued methotrexate September 2021 due to low WBC Fosamax March 2019 till March 2023 with few gaps.  Procedures:  No procedures performed Allergies: Erythromycin, Penicillins, and Sulfa antibiotics   Assessment / Plan:     Visit Diagnoses: Psoriatic arthritis (HCC) - Hx of arthritis mutilans-Bilateral thumbs: She has no joint tenderness, synovitis, or dactylitis on examination today.  She has not been  experiencing any morning stiffness, nocturnal pain, or difficulty with ADLs.  She has no Achilles tendinitis or plantar fasciitis.  No SI joint tenderness upon palpation.  No active psoriasis at this time.  She is clinically doing well on Cosentyx 300 mg sq injections every 4 weeks.  She continues to tolerate Cosentyx without any side effects or injection site reactions.  No medication changes will be made at this time.  She is vies notify us if she develops signs or symptoms of a flare.  She would like to continue to follow-up every 3 months for close monitoring.  High risk medication use - Cosentyx 300 mg sq injections every 4 weeks. Initiated Cosentyx on 06/30/22.TB gold negative on 02/01/23. CBC and CMP updated on 05/31/23. Her next lab work will be due in October and every 3 months.  TB gold negative on 02/01/23.  No recurrent infections. Discussed the importance of holding cosentyx if she develops signs or symptoms of an infection and to resume once the infection has completely cleared. Recommend receiving the shingrix and pneumonia vaccines.   Other psoriasis: No active psoriasis at this time.   Contracture of elbow joint, right: No tenderness or  inflammation noted.   Age-related osteoporosis without current pathological fracture - Fosamax was discontinued in March 2023 after her repeat DEXA scan  DEXA updated on 01/28/2022: Lumbar spine BMD 0.831 with T score -2.1. Due to update DEXA in March 2025.   Other medical conditions are listed as follows:  Vitamin D deficiency  Renal calcinosis  Essential hypertension: Blood pressure was 96/64 today in the office.  Vitamin B12 deficiency  History of anemia  Orders: No orders of the defined types were placed in this encounter.  No orders of the defined types were placed in this encounter.   Follow-Up Instructions: Return in about 3 months (around 09/16/2023) for Psoriatic arthritis, Osteoporosis.   Gearldine Bienenstock, PA-C  Note - This record has been created using Dragon software.  Chart creation errors have been sought, but may not always  have been located. Such creation errors do not reflect on  the standard of medical care.

## 2023-06-16 ENCOUNTER — Ambulatory Visit: Payer: Medicare PPO | Attending: Physician Assistant | Admitting: Physician Assistant

## 2023-06-16 ENCOUNTER — Encounter: Payer: Self-pay | Admitting: Physician Assistant

## 2023-06-16 VITALS — BP 96/64 | HR 66 | Resp 14 | Ht 64.0 in | Wt 114.0 lb

## 2023-06-16 DIAGNOSIS — Z79899 Other long term (current) drug therapy: Secondary | ICD-10-CM | POA: Diagnosis not present

## 2023-06-16 DIAGNOSIS — N29 Other disorders of kidney and ureter in diseases classified elsewhere: Secondary | ICD-10-CM

## 2023-06-16 DIAGNOSIS — I10 Essential (primary) hypertension: Secondary | ICD-10-CM | POA: Diagnosis not present

## 2023-06-16 DIAGNOSIS — E559 Vitamin D deficiency, unspecified: Secondary | ICD-10-CM | POA: Diagnosis not present

## 2023-06-16 DIAGNOSIS — M24521 Contracture, right elbow: Secondary | ICD-10-CM | POA: Diagnosis not present

## 2023-06-16 DIAGNOSIS — L408 Other psoriasis: Secondary | ICD-10-CM

## 2023-06-16 DIAGNOSIS — L4050 Arthropathic psoriasis, unspecified: Secondary | ICD-10-CM | POA: Diagnosis not present

## 2023-06-16 DIAGNOSIS — E538 Deficiency of other specified B group vitamins: Secondary | ICD-10-CM | POA: Diagnosis not present

## 2023-06-16 DIAGNOSIS — M810 Age-related osteoporosis without current pathological fracture: Secondary | ICD-10-CM

## 2023-06-16 DIAGNOSIS — Z862 Personal history of diseases of the blood and blood-forming organs and certain disorders involving the immune mechanism: Secondary | ICD-10-CM

## 2023-06-16 NOTE — Patient Instructions (Signed)

## 2023-06-30 ENCOUNTER — Encounter: Payer: Self-pay | Admitting: Pharmacist

## 2023-06-30 NOTE — Telephone Encounter (Signed)
Error

## 2023-07-08 DIAGNOSIS — I129 Hypertensive chronic kidney disease with stage 1 through stage 4 chronic kidney disease, or unspecified chronic kidney disease: Secondary | ICD-10-CM | POA: Diagnosis not present

## 2023-07-08 DIAGNOSIS — R413 Other amnesia: Secondary | ICD-10-CM | POA: Diagnosis not present

## 2023-07-08 DIAGNOSIS — N182 Chronic kidney disease, stage 2 (mild): Secondary | ICD-10-CM | POA: Diagnosis not present

## 2023-07-08 DIAGNOSIS — D84821 Immunodeficiency due to drugs: Secondary | ICD-10-CM | POA: Diagnosis not present

## 2023-07-08 DIAGNOSIS — Z23 Encounter for immunization: Secondary | ICD-10-CM | POA: Diagnosis not present

## 2023-07-08 DIAGNOSIS — L4050 Arthropathic psoriasis, unspecified: Secondary | ICD-10-CM | POA: Diagnosis not present

## 2023-07-08 DIAGNOSIS — F439 Reaction to severe stress, unspecified: Secondary | ICD-10-CM | POA: Diagnosis not present

## 2023-07-21 DIAGNOSIS — L309 Dermatitis, unspecified: Secondary | ICD-10-CM | POA: Diagnosis not present

## 2023-07-26 DIAGNOSIS — L139 Bullous disorder, unspecified: Secondary | ICD-10-CM | POA: Diagnosis not present

## 2023-07-31 DIAGNOSIS — F4321 Adjustment disorder with depressed mood: Secondary | ICD-10-CM | POA: Diagnosis not present

## 2023-08-19 ENCOUNTER — Other Ambulatory Visit: Payer: Self-pay

## 2023-08-19 DIAGNOSIS — L4050 Arthropathic psoriasis, unspecified: Secondary | ICD-10-CM

## 2023-08-19 DIAGNOSIS — L408 Other psoriasis: Secondary | ICD-10-CM

## 2023-08-19 DIAGNOSIS — Z79899 Other long term (current) drug therapy: Secondary | ICD-10-CM

## 2023-08-19 MED ORDER — COSENTYX SENSOREADY (300 MG) 150 MG/ML ~~LOC~~ SOAJ: 300.0000 mg | SUBCUTANEOUS | 2 refills | Status: DC

## 2023-08-19 NOTE — Telephone Encounter (Signed)
Patient contacted the office to request a refill of Cosentyx be sent to [facility].   Last Fill: 05/25/2023  Labs: 05/31/2023 CMP WNL. CBC stable.  TB Gold: 02/01/2023 TB gold negative   Next Visit: 09/16/2023  Last Visit: 06/16/2023  [id number] arthritis   Current Dose per office note 06/16/2023: Cosentyx 300 mg sq injections every 4 weeks.   Okay to refill Cosentyx?

## 2023-08-30 DIAGNOSIS — F4321 Adjustment disorder with depressed mood: Secondary | ICD-10-CM | POA: Diagnosis not present

## 2023-09-03 ENCOUNTER — Other Ambulatory Visit: Payer: Self-pay | Admitting: *Deleted

## 2023-09-03 DIAGNOSIS — Z79899 Other long term (current) drug therapy: Secondary | ICD-10-CM

## 2023-09-03 NOTE — Progress Notes (Unsigned)
Office Visit Note  Patient: Taylor Kirby             Date of Birth: 08-28-1956           MRN: [id number]             PCP: Laurann Montana, MD Referring: Laurann Montana, MD Visit Date: 09/16/2023 Occupation: @GUAROCC @  Subjective:  Medication monitoring   History of Present Illness: Taylor Kirby is a 67 y.o. female with history of psoriatic arthritis.  She remains on Cosentyx 300 mg sq injections every 4 weeks.  She continues to tolerate Cosentyx without any side effects or injection site reactions.  She denies any signs or symptoms of a psoriatic arthritis flare.  She denies any psoriasis at this time.  She denies any Achilles tendinitis or plantar fasciitis.  She has not had any SI joint pain.  She denies any joint swelling at this time.  She denies any new medical conditions.  She denies any recent or recurrent infections.  She is planning on getting the annual flu shot next week.    Activities of Daily Living:  Patient reports morning stiffness for 0  none .   Patient Denies nocturnal pain.  Difficulty dressing/grooming: Denies Difficulty climbing stairs: Denies Difficulty getting out of chair: Denies Difficulty using hands for taps, buttons, cutlery, and/or writing: Denies  Review of Systems  Constitutional:  Negative for fatigue.  HENT:  Negative for mouth sores and mouth dryness.   Eyes:  Negative for dryness.  Respiratory:  Negative for shortness of breath.   Cardiovascular:  Negative for chest pain and palpitations.  Gastrointestinal:  Negative for blood in stool, constipation and diarrhea.  Endocrine: Negative for increased urination.  Genitourinary:  Negative for involuntary urination.  Musculoskeletal:  Negative for joint pain, gait problem, joint pain, joint swelling, myalgias, muscle weakness, morning stiffness, muscle tenderness and myalgias.  Skin:  Negative for color change, rash, hair loss and sensitivity to sunlight.  Allergic/Immunologic: Negative for  susceptible to infections.  Neurological:  Negative for dizziness and headaches.  Hematological:  Negative for swollen glands.  Psychiatric/Behavioral:  Negative for depressed mood and sleep disturbance. The patient is not nervous/anxious.     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   High risk medication use 10/03/2016   Psoriatic arthritis (HCC) 10/25/2015    Past Medical History:  Diagnosis Date   Allergy    Anemia    Chronic kidney disease    Heart murmur    Osteoporosis    Rheumatoid arthritis (HCC)     Family History  Problem Relation Age of Onset   Hyperlipidemia Mother    Hypertension Mother    Stroke Mother    Hyperlipidemia Father    Cancer Father    Heart attack Father    Hypertension Sister    Heart disease Maternal Grandmother    Cancer Maternal Grandfather    Hypertension Paternal Grandmother    Past Surgical History:  Procedure Laterality Date   TUBAL LIGATION     Social History   Social History Narrative   Not on file   Immunization History  Administered Date(s) Administered   Moderna SARS-COV2 Booster Vaccination 11/19/2020   Moderna Sars-Covid-2 Vaccination 01/27/2020, 02/24/2020     Objective: Vital Signs: BP 129/77 (BP Location: Left Arm,  Patient Position: Sitting, Cuff Size: Normal)   Pulse 66   Resp 15   Ht 5\' 4"  (1.626 m)   Wt 114 lb (51.7 kg)   BMI 19.57 kg/m    Physical Exam Vitals and nursing note reviewed.  Constitutional:      Appearance: She is well-developed.  HENT:     Head: Normocephalic and atraumatic.  Eyes:     Conjunctiva/sclera: Conjunctivae normal.  Cardiovascular:     Rate and Rhythm: Normal rate and regular rhythm.     Heart sounds: Normal heart sounds.  Pulmonary:     Effort: Pulmonary effort is normal.     Breath sounds:  Normal breath sounds.  Abdominal:     General: Bowel sounds are normal.     Palpations: Abdomen is soft.  Musculoskeletal:     Cervical back: Normal range of motion.  Lymphadenopathy:     Cervical: No cervical adenopathy.  Skin:    General: Skin is warm and dry.     Capillary Refill: Capillary refill takes less than 2 seconds.  Neurological:     Mental Status: She is alert and oriented to person, place, and time.  Psychiatric:        Behavior: Behavior normal.      Musculoskeletal Exam: C-spine, thoracic spine, lumbar spine good range of motion.  No midline spinal tenderness.  No SI joint tenderness.  Shoulder joints have good range of motion with no discomfort.  Right elbow flexion contracture unchanged.  Left elbow has good range of motion.  Limited extension of both wrist joints.  Arthritis mutilans of both thumbs.  PIP and DIP thickening with subluxation.  No synovitis noted.  Hip joints have good range of motion with no groin pain.  Knee joints have good range of motion with no warmth or effusion.  Ankle joints have good range of motion with no tenderness or joint swelling.  No evidence of Achilles tendinitis.  CDAI Exam: CDAI Score: -- Patient Global: --; Provider Global: -- Swollen: --; Tender: -- Joint Exam 09/16/2023   No joint exam has been documented for this visit   There is currently no information documented on the homunculus. Go to the Rheumatology activity and complete the homunculus joint exam.  Investigation: No additional findings.  Imaging: No results found.  Recent Labs: Lab Results  Component Value Date   WBC 3.9 09/03/2023   HGB 13.1 09/03/2023   PLT 268 09/03/2023   NA 140 09/03/2023   K 4.3 09/03/2023   CL 104 09/03/2023   CO2 29 09/03/2023   GLUCOSE 83 09/03/2023   BUN 20 09/03/2023   CREATININE 0.91 09/03/2023   BILITOT 0.3 09/03/2023   ALKPHOS 95 02/04/2018   AST 19 09/03/2023   ALT 15 09/03/2023   PROT 7.2 09/03/2023   ALBUMIN 3.7  02/04/2018   CALCIUM 9.5 09/03/2023   GFRAA 85 04/10/2021   QFTBGOLDPLUS NEGATIVE 02/01/2023    Speciality Comments: Discontinued methotrexate September 2021 due to low WBC Fosamax March 2019 till March 2023 with few gaps.  Procedures:  No procedures performed Allergies: Erythromycin, Penicillins, and Sulfa antibiotics     Assessment / Plan:     Visit Diagnoses: Psoriatic arthritis (HCC) - Hx of arthritis mutilans-Bilateral thumbs: No synovitis or dactylitis noted on examination today.  No SI joint tenderness upon palpation.  No evidence of Achilles tendinitis or plantar fasciitis.  Patient remains on Cosentyx 300 mg sq injections every 4 weeks.  She is tolerating Cosentyx without any side effects  or injection site reactions.  She has not had any recent or recurrent infections.  No gaps in therapy.  Patient will remain on Cosentyx as monotherapy.  She was advised to notify us if she develops signs or symptoms of a flare.  She will follow-up in the office in 3 months or sooner if needed.  High risk medication use - Cosentyx 300 mg sq injections every 4 weeks. Initiated Cosentyx on 06/30/22. CBC and CMP updated on 09/03/23.Her next lab work will be updated in January and every 3 months.  TB gold negative 02/01/23.  No recent or recurrent infections.  Discussed the importance of holding cosentyx if she develops signs or symptoms of an infection and to resume once the infection has completely cleared.  - Plan: CBC with Differential/Platelet, COMPLETE METABOLIC PANEL WITH GFR  Other psoriasis: She has no active psoriasis at this time.  She will remain on Cosentyx as monotherapy.  Contracture of elbow joint, right: Unchanged.  No tenderness or inflammation.  Age-related osteoporosis without current pathological fracture - Fosamax was discontinued in March 2023 after her repeat DEXA scan.  DEXA updated on 01/28/2022: Lumbar spine BMD 0.831 with T score -2.1. Due to update DEXA in March 2025.   Other  medical conditions are listed as follows:  Vitamin D deficiency  Renal calcinosis  Essential hypertension: Blood pressure was 129/77 today in the office.  Vitamin B12 deficiency  History of anemia: Hemoglobin and hematocrit were within normal limits on 09/03/2023.  Diabetes mellitus screening -Future order for hemoglobin A1c was placed as requested.  Plan: HgB A1c  Lipid screening -Future order for lipid panel placed today to be drawn with her next lab work as requested.  Plan: Lipid panel  Orders: Orders Placed This Encounter  Procedures   HgB A1c   Lipid panel   CBC with Differential/Platelet   COMPLETE METABOLIC PANEL WITH GFR   No orders of the defined types were placed in this encounter.    Follow-Up Instructions: Return in about 3 months (around 12/17/2023) for Psoriatic arthritis.   Gearldine Bienenstock, PA-C  Note - This record has been created using Dragon software.  Chart creation errors have been sought, but may not always  have been located. Such creation errors do not reflect on  the standard of medical care.

## 2023-09-04 LAB — COMPLETE METABOLIC PANEL WITH GFR
AG Ratio: 1.1 (calc) (ref 1.0–2.5)
ALT: 15 U/L (ref 6–29)
AST: 19 U/L (ref 10–35)
Albumin: 3.8 g/dL (ref 3.6–5.1)
Alkaline phosphatase (APISO): 82 U/L (ref 37–153)
BUN: 20 mg/dL (ref 7–25)
CO2: 29 mmol/L (ref 20–32)
Calcium: 9.5 mg/dL (ref 8.6–10.4)
Chloride: 104 mmol/L (ref 98–110)
Creat: 0.91 mg/dL (ref 0.50–1.05)
Globulin: 3.4 g/dL (ref 1.9–3.7)
Glucose, Bld: 83 mg/dL (ref 65–99)
Potassium: 4.3 mmol/L (ref 3.5–5.3)
Sodium: 140 mmol/L (ref 135–146)
Total Bilirubin: 0.3 mg/dL (ref 0.2–1.2)
Total Protein: 7.2 g/dL (ref 6.1–8.1)
eGFR: 70 mL/min/{1.73_m2} (ref >=60)

## 2023-09-04 LAB — CBC WITH DIFFERENTIAL/PLATELET
Absolute Monocytes: 218 {cells}/uL (ref 200–950)
Basophils Absolute: 39 {cells}/uL (ref 0–200)
Basophils Relative: 1 %
Eosinophils Absolute: 90 {cells}/uL (ref 15–500)
Eosinophils Relative: 2.3 %
HCT: 43.9 % (ref 35.0–45.0)
Hemoglobin: 13.1 g/dL (ref 11.7–15.5)
Lymphs Abs: 1521 {cells}/uL (ref 850–3900)
MCH: 24 pg — ABNORMAL LOW (ref 27.0–33.0)
MCHC: 29.8 g/dL — ABNORMAL LOW (ref 32.0–36.0)
MCV: 80.6 fL (ref 80.0–100.0)
MPV: 11.7 fL (ref 7.5–12.5)
Monocytes Relative: 5.6 %
Neutro Abs: 2032 {cells}/uL (ref 1500–7800)
Neutrophils Relative %: 52.1 %
Platelets: 268 10*3/uL (ref 140–400)
RBC: 5.45 10*6/uL — ABNORMAL HIGH (ref 3.80–5.10)
RDW: 14.2 % (ref 11.0–15.0)
Total Lymphocyte: 39 %
WBC: 3.9 10*3/uL (ref 3.8–10.8)

## 2023-09-06 NOTE — Progress Notes (Signed)
CBC stable. CMP WNL.

## 2023-09-07 ENCOUNTER — Encounter: Payer: Self-pay | Admitting: Psychology

## 2023-09-13 ENCOUNTER — Telehealth: Payer: Self-pay | Admitting: Pharmacist

## 2023-09-13 NOTE — Telephone Encounter (Signed)
Received PAP renewal application from [employer] for patient's Cosentyx SQ. Mailed to pt's home today.   Provider portion and PA renewal will be completed after patient portion is returned   Chesley Mires, PharmD, MPH, BCPS, CPP Clinical Pharmacist (Rheumatology and Pulmonology)

## 2023-09-16 ENCOUNTER — Ambulatory Visit: Payer: Medicare PPO | Attending: Physician Assistant | Admitting: Physician Assistant

## 2023-09-16 ENCOUNTER — Encounter: Payer: Self-pay | Admitting: Physician Assistant

## 2023-09-16 VITALS — BP 129/77 | HR 66 | Resp 15 | Ht 64.0 in | Wt 114.0 lb

## 2023-09-16 DIAGNOSIS — I10 Essential (primary) hypertension: Secondary | ICD-10-CM | POA: Diagnosis not present

## 2023-09-16 DIAGNOSIS — L408 Other psoriasis: Secondary | ICD-10-CM | POA: Diagnosis not present

## 2023-09-16 DIAGNOSIS — L4050 Arthropathic psoriasis, unspecified: Secondary | ICD-10-CM | POA: Diagnosis not present

## 2023-09-16 DIAGNOSIS — N29 Other disorders of kidney and ureter in diseases classified elsewhere: Secondary | ICD-10-CM

## 2023-09-16 DIAGNOSIS — Z13220 Encounter for screening for lipoid disorders: Secondary | ICD-10-CM

## 2023-09-16 DIAGNOSIS — E538 Deficiency of other specified B group vitamins: Secondary | ICD-10-CM | POA: Diagnosis not present

## 2023-09-16 DIAGNOSIS — M810 Age-related osteoporosis without current pathological fracture: Secondary | ICD-10-CM | POA: Diagnosis not present

## 2023-09-16 DIAGNOSIS — E559 Vitamin D deficiency, unspecified: Secondary | ICD-10-CM | POA: Diagnosis not present

## 2023-09-16 DIAGNOSIS — Z862 Personal history of diseases of the blood and blood-forming organs and certain disorders involving the immune mechanism: Secondary | ICD-10-CM

## 2023-09-16 DIAGNOSIS — M24521 Contracture, right elbow: Secondary | ICD-10-CM | POA: Diagnosis not present

## 2023-09-16 DIAGNOSIS — Z79899 Other long term (current) drug therapy: Secondary | ICD-10-CM | POA: Diagnosis not present

## 2023-09-16 DIAGNOSIS — Z131 Encounter for screening for diabetes mellitus: Secondary | ICD-10-CM

## 2023-09-16 NOTE — Patient Instructions (Signed)

## 2023-09-28 DIAGNOSIS — R799 Abnormal finding of blood chemistry, unspecified: Secondary | ICD-10-CM | POA: Diagnosis not present

## 2023-09-28 DIAGNOSIS — E559 Vitamin D deficiency, unspecified: Secondary | ICD-10-CM | POA: Diagnosis not present

## 2023-09-28 DIAGNOSIS — I129 Hypertensive chronic kidney disease with stage 1 through stage 4 chronic kidney disease, or unspecified chronic kidney disease: Secondary | ICD-10-CM | POA: Diagnosis not present

## 2023-09-28 DIAGNOSIS — Z23 Encounter for immunization: Secondary | ICD-10-CM | POA: Diagnosis not present

## 2023-09-28 DIAGNOSIS — N182 Chronic kidney disease, stage 2 (mild): Secondary | ICD-10-CM | POA: Diagnosis not present

## 2023-09-29 LAB — LAB REPORT - SCANNED: A1c: 6.2

## 2023-09-30 DIAGNOSIS — F4321 Adjustment disorder with depressed mood: Secondary | ICD-10-CM | POA: Diagnosis not present

## 2023-10-07 ENCOUNTER — Telehealth: Payer: Self-pay | Admitting: *Deleted

## 2023-10-07 NOTE — Telephone Encounter (Signed)
Labs received from:[facility]  Drawn on: 09/28/2023   Reviewed by:Sherron Ales, PA-C  Labs drawn: UA, Protein/Creat. Ratio. CBC, Hgb A1C  Results: UA: Protein 2+           Glucose 3+           Protein/Creat. Ratio 786           CO2 31           RBC 5.64           MCV 76           MCH 25           RDW 15.7          Hgb A1C 6.2

## 2023-10-10 DIAGNOSIS — L03116 Cellulitis of left lower limb: Secondary | ICD-10-CM | POA: Diagnosis not present

## 2023-10-10 DIAGNOSIS — T148XXA Other injury of unspecified body region, initial encounter: Secondary | ICD-10-CM | POA: Diagnosis not present

## 2023-10-18 DIAGNOSIS — L810 Postinflammatory hyperpigmentation: Secondary | ICD-10-CM | POA: Diagnosis not present

## 2023-10-18 DIAGNOSIS — L309 Dermatitis, unspecified: Secondary | ICD-10-CM | POA: Diagnosis not present

## 2023-10-26 DIAGNOSIS — I129 Hypertensive chronic kidney disease with stage 1 through stage 4 chronic kidney disease, or unspecified chronic kidney disease: Secondary | ICD-10-CM | POA: Diagnosis not present

## 2023-10-26 NOTE — Telephone Encounter (Signed)
Received signed patient forms with income documents and insurance card copy. Provider portion signed by Sherron Ales, PA-C today.   Submitted a Prior Authorization request to [facility] for TALTZ via CoverMyMeds. Will update once we receive a response.  Key: Rolm Baptise, PharmD, MPH, BCPS, CPP Clinical Pharmacist (Rheumatology and Pulmonology)

## 2023-11-01 NOTE — Telephone Encounter (Signed)
Received notification from [facility] regarding a prior authorization for COSENTYX SQ. Authorization has been APPROVED from 10/26/23 to 11/29/24. Approval letter sent to scan center.  Submitted Patient Assistance RENEWAL Application to [employer] for COSENTYX SQ along with provider portion, patient portion, PA, medication list, insurance card copy and income documents. Will update patient when we receive a response.  Phone: [phone] Fax: [phone]  Chesley Mires, PharmD, MPH, BCPS, CPP Clinical Pharmacist (Rheumatology and Pulmonology)

## 2023-11-03 ENCOUNTER — Telehealth: Payer: Self-pay

## 2023-11-03 DIAGNOSIS — L4050 Arthropathic psoriasis, unspecified: Secondary | ICD-10-CM

## 2023-11-03 DIAGNOSIS — Z79899 Other long term (current) drug therapy: Secondary | ICD-10-CM

## 2023-11-03 DIAGNOSIS — L408 Other psoriasis: Secondary | ICD-10-CM

## 2023-11-03 NOTE — Telephone Encounter (Signed)
Patient states she had given the pharmacist some paperwork to submit to Patient Assistance. Patient states she received a test message stating the assistance was denied. Patient inquires if there is anything else that can be done. Please advise.

## 2023-11-04 ENCOUNTER — Other Ambulatory Visit (HOSPITAL_COMMUNITY): Payer: Self-pay

## 2023-11-04 MED ORDER — COSENTYX SENSOREADY (300 MG) 150 MG/ML ~~LOC~~ SOAJ: 300.0000 mg | SUBCUTANEOUS | 0 refills | Status: DC

## 2023-11-04 NOTE — Telephone Encounter (Addendum)
Received multiple voicemails this morning from patient regarding Cosentyx PAP renewal denial  Patient's copay for Cosentyx is $100 per month since she has [employer] (former [employer] plan member). She states she is definitely comfortable with paying $100 per month in 2025. She is going to get one additional fill through PAP this year and then we can fill her Cosentyx through [facility] next year.  Her next dose of Cosentyx (through PAP) is due to be administered on 11/12/23, so her following dose would be due 12/09/2022. Rx sent to [facility]. [facility] rx deferred.  Chesley Mires, PharmD, MPH, BCPS, CPP Clinical Pharmacist (Rheumatology and Pulmonology)

## 2023-11-04 NOTE — Telephone Encounter (Signed)
Received fax from [employer] for COSENTYX SQ patient assistance, patient's application has been DENIED due to exceeding income criteria for program.    Received multiple voicemails this morning from patient regarding Cosentyx PAP renewal denial  Patient's copay for Cosentyx is $100 per month since she has [employer] (former [employer] plan member). She states she is definitely comfortable with paying $100 per month in 2025. She is going to get one additional fill through PAP this year and then we can fill her Cosentyx through [facility] next year.  Her next dose of Cosentyx (through PAP) is due to be administered on 11/12/23, so her following dose would be due 12/09/2022. Rx sent to [facility]. [facility] rx deferred.  Chesley Mires, PharmD, MPH, BCPS, CPP Clinical Pharmacist (Rheumatology and Pulmonology)

## 2023-11-04 NOTE — Addendum Note (Signed)
Addended by: Murrell Redden on: 11/04/2023 09:40 AM   Modules accepted: Orders

## 2023-11-08 ENCOUNTER — Other Ambulatory Visit: Payer: Self-pay | Admitting: Rheumatology

## 2023-11-08 DIAGNOSIS — L4050 Arthropathic psoriasis, unspecified: Secondary | ICD-10-CM

## 2023-11-08 DIAGNOSIS — Z79899 Other long term (current) drug therapy: Secondary | ICD-10-CM

## 2023-11-08 DIAGNOSIS — L408 Other psoriasis: Secondary | ICD-10-CM

## 2023-11-08 MED ORDER — COSENTYX SENSOREADY (300 MG) 150 MG/ML ~~LOC~~ SOAJ: 300.0000 mg | SUBCUTANEOUS | 0 refills | Status: DC

## 2023-11-08 NOTE — Telephone Encounter (Signed)
Patient contacted the office to request a medication refill.   1. Name of Medication: Cosentyx  2. How are you currently taking this medication (dosage and times per day)? Injection    3. What pharmacy would you like for that to be sent to?  Pt has it sent through [facility]    [phone] is the number

## 2023-11-08 NOTE — Telephone Encounter (Signed)
Last Fill: 08/19/2023  Labs: 09/28/2023 CO2 31, RBC 5.64, MCV 76, MCH 25.0, RDW 15.7  TB Gold: 02/01/2023 Neg    Next Visit: 12/20/2023  Last Visit: 09/16/2023  DX: Psoriatic arthritis   Current Dose per office note 09/16/2023: Cosentyx 300 mg sq injections every 4 weeks.   Okay to refill Cosentyx?

## 2023-12-03 ENCOUNTER — Other Ambulatory Visit: Payer: Self-pay | Admitting: Rheumatology

## 2023-12-03 DIAGNOSIS — Z79899 Other long term (current) drug therapy: Secondary | ICD-10-CM

## 2023-12-03 DIAGNOSIS — L4050 Arthropathic psoriasis, unspecified: Secondary | ICD-10-CM

## 2023-12-03 DIAGNOSIS — L408 Other psoriasis: Secondary | ICD-10-CM

## 2023-12-03 MED ORDER — COSENTYX SENSOREADY (300 MG) 150 MG/ML ~~LOC~~ SOAJ
300.0000 mg | SUBCUTANEOUS | 2 refills | Status: DC
  Filled 2023-12-07 – 2023-12-14 (×2): qty 2, 28d supply, fill #0
  Filled 2023-12-30: qty 2, 28d supply, fill #1
  Filled 2024-01-31: qty 2, 28d supply, fill #2

## 2023-12-03 NOTE — Telephone Encounter (Signed)
 Last Fill: 11/08/2023   Labs: 09/28/2023 CO2 31, RBC 5.64, MCV 76, MCH 25.0, RDW 15.7   TB Gold: 02/01/2023 Neg     Next Visit: 12/20/2023   Last Visit: 09/16/2023   DX: Psoriatic arthritis    Current Dose per office note 09/16/2023: Cosentyx  300 mg sq injections every 4 weeks.    Okay to refill Cosentyx ?

## 2023-12-03 NOTE — Telephone Encounter (Signed)
 Patient called checking the status of her Cosentyx prescription.   Patient states she is due to take her injection on Tuesday, 12/10/23 and hasn't received a call from [facility] to confirm shipment.  Patient requested a return call.

## 2023-12-06 ENCOUNTER — Other Ambulatory Visit: Payer: Self-pay

## 2023-12-06 ENCOUNTER — Other Ambulatory Visit (HOSPITAL_COMMUNITY): Payer: Self-pay

## 2023-12-06 NOTE — Progress Notes (Deleted)
Office Visit Note  Patient: Taylor Kirby             Date of Birth: [birth year]           MRN: [id number]             PCP: Laurann Montana, MD Referring: Laurann Montana, MD Visit Date: 12/20/2023 Occupation: @GUAROCC @  Subjective:  No chief complaint on file.   History of Present Illness: Taylor Kirby is a 68 y.o. female ***     Activities of Daily Living:  Patient reports morning stiffness for *** {minute/hour:19697}.   Patient {ACTIONS;DENIES/REPORTS:21021675::"Denies"} nocturnal pain.  Difficulty dressing/grooming: {ACTIONS;DENIES/REPORTS:21021675::"Denies"} Difficulty climbing stairs: {ACTIONS;DENIES/REPORTS:21021675::"Denies"} Difficulty getting out of chair: {ACTIONS;DENIES/REPORTS:21021675::"Denies"} Difficulty using hands for taps, buttons, cutlery, and/or writing: {ACTIONS;DENIES/REPORTS:21021675::"Denies"}  No Rheumatology ROS completed.   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   High risk medication use 10/03/2016   Psoriatic arthritis (HCC) 10/25/2015    Past Medical History:  Diagnosis Date   Allergy    Anemia    Chronic kidney disease    Heart murmur    Osteoporosis    Rheumatoid arthritis (HCC)     Family History  Problem Relation Age of Onset   Hyperlipidemia Mother    Hypertension Mother    Stroke Mother    Hyperlipidemia Father    Cancer Father    Heart attack Father    Hypertension Sister    Heart disease Maternal Grandmother    Cancer Maternal Grandfather    Hypertension Paternal Grandmother    Past Surgical History:  Procedure Laterality Date   TUBAL LIGATION     Social History   Social History Narrative   Not on file   Immunization History  Administered Date(s) Administered   Moderna SARS-COV2 Booster Vaccination 11/19/2020   Moderna  Sars-Covid-2 Vaccination 01/27/2020, 02/24/2020     Objective: Vital Signs: There were no vitals taken for this visit.   Physical Exam   Musculoskeletal Exam: ***  CDAI Exam: CDAI Score: -- Patient Global: --; Provider Global: -- Swollen: --; Tender: -- Joint Exam 12/20/2023   No joint exam has been documented for this visit   There is currently no information documented on the homunculus. Go to the Rheumatology activity and complete the homunculus joint exam.  Investigation: No additional findings.  Imaging: No results found.  Recent Labs: Lab Results  Component Value Date   WBC 3.9 09/03/2023   HGB 13.1 09/03/2023   PLT 268 09/03/2023   NA 140 09/03/2023   K 4.3 09/03/2023   CL 104 09/03/2023   CO2 29 09/03/2023   GLUCOSE 83 09/03/2023   BUN 20 09/03/2023   CREATININE 0.91 09/03/2023   BILITOT 0.3 09/03/2023   ALKPHOS 95 02/04/2018   AST 19 09/03/2023   ALT 15 09/03/2023   PROT 7.2 09/03/2023   ALBUMIN 3.7 02/04/2018   CALCIUM 9.5 09/03/2023   GFRAA 85 04/10/2021   QFTBGOLDPLUS NEGATIVE 02/01/2023    Speciality Comments: Discontinued methotrexate September 2021 due to low WBC Fosamax March 2019 till March 2023 with few gaps.  Procedures:  No procedures performed Allergies: Erythromycin, Penicillins, and Sulfa antibiotics   Assessment / Plan:     Visit Diagnoses: Psoriatic arthritis (HCC)  Other psoriasis  High risk medication use  Contracture of elbow joint, right  Age-related  osteoporosis without current pathological fracture  Vitamin D deficiency  Renal calcinosis  Essential hypertension  Vitamin B12 deficiency  History of anemia  Diabetes mellitus screening  Lipid screening  Orders: No orders of the defined types were placed in this encounter.  No orders of the defined types were placed in this encounter.   Face-to-face time spent with patient was *** minutes. Greater than 50% of time was spent in counseling and coordination  of care.  Follow-Up Instructions: No follow-ups on file.   Gearldine Bienenstock, PA-C  Note - This record has been created using Dragon software.  Chart creation errors have been sought, but may not always  have been located. Such creation errors do not reflect on  the standard of medical care.

## 2023-12-07 ENCOUNTER — Other Ambulatory Visit: Payer: Self-pay

## 2023-12-07 ENCOUNTER — Other Ambulatory Visit: Payer: Self-pay | Admitting: Pharmacy Technician

## 2023-12-07 ENCOUNTER — Other Ambulatory Visit (HOSPITAL_COMMUNITY): Payer: Self-pay

## 2023-12-07 NOTE — Progress Notes (Signed)
 Specialty Pharmacy Initial Fill Coordination Note  Taylor Kirby is a 68 y.o. female contacted today regarding initial fill of specialty medication(s) Secukinumab  (Cosentyx  Sensoready (300 MG))   Patient requested Delivery   Delivery date: 12/09/23   Verified address: [address]   [place] [address]  *PLEASE USE PRESCRIPTION ADDRESS IN WAM TO INCLUDE DELIVERY DIRECTIONS  Medication will be filled on 12/08/23.   Patient is aware of $100.00 copayment.

## 2023-12-10 ENCOUNTER — Other Ambulatory Visit (HOSPITAL_COMMUNITY)
Admission: RE | Admit: 2023-12-10 | Discharge: 2023-12-10 | Disposition: A | Discharge status: Home / Self Care | Payer: Medicare PPO | Source: Ambulatory Visit | Attending: Obstetrics and Gynecology | Admitting: Obstetrics and Gynecology

## 2023-12-10 DIAGNOSIS — Z01419 Encounter for gynecological examination (general) (routine) without abnormal findings: Secondary | ICD-10-CM | POA: Diagnosis not present

## 2023-12-10 DIAGNOSIS — Z8742 Personal history of other diseases of the female genital tract: Secondary | ICD-10-CM | POA: Diagnosis not present

## 2023-12-10 DIAGNOSIS — M810 Age-related osteoporosis without current pathological fracture: Secondary | ICD-10-CM | POA: Diagnosis not present

## 2023-12-10 DIAGNOSIS — Z9189 Other specified personal risk factors, not elsewhere classified: Secondary | ICD-10-CM | POA: Diagnosis not present

## 2023-12-13 ENCOUNTER — Other Ambulatory Visit: Payer: Self-pay | Admitting: *Deleted

## 2023-12-13 DIAGNOSIS — Z13220 Encounter for screening for lipoid disorders: Secondary | ICD-10-CM

## 2023-12-13 DIAGNOSIS — Z79899 Other long term (current) drug therapy: Secondary | ICD-10-CM | POA: Diagnosis not present

## 2023-12-13 DIAGNOSIS — Z131 Encounter for screening for diabetes mellitus: Secondary | ICD-10-CM | POA: Diagnosis not present

## 2023-12-14 ENCOUNTER — Other Ambulatory Visit: Payer: Self-pay

## 2023-12-14 ENCOUNTER — Other Ambulatory Visit (HOSPITAL_COMMUNITY): Payer: Self-pay

## 2023-12-14 LAB — CYTOLOGY - PAP
Comment: NEGATIVE
Diagnosis: NEGATIVE
High risk HPV: NEGATIVE

## 2023-12-14 LAB — COMPLETE METABOLIC PANEL WITH GFR
AG Ratio: 1.2 (calc) (ref 1.0–2.5)
ALT: 16 U/L (ref 6–29)
AST: 22 U/L (ref 10–35)
Albumin: 4.1 g/dL (ref 3.6–5.1)
Alkaline phosphatase (APISO): 77 U/L (ref 37–153)
BUN: 24 mg/dL (ref 7–25)
CO2: 28 mmol/L (ref 20–32)
Calcium: 10.1 mg/dL (ref 8.6–10.4)
Chloride: 101 mmol/L (ref 98–110)
Creat: 0.79 mg/dL (ref 0.50–1.05)
Globulin: 3.5 g/dL (ref 1.9–3.7)
Glucose, Bld: 113 mg/dL — ABNORMAL HIGH (ref 65–99)
Potassium: 4.3 mmol/L (ref 3.5–5.3)
Sodium: 138 mmol/L (ref 135–146)
Total Bilirubin: 0.3 mg/dL (ref 0.2–1.2)
Total Protein: 7.6 g/dL (ref 6.1–8.1)
eGFR: 82 mL/min/{1.73_m2} (ref >=60)

## 2023-12-14 LAB — CBC WITH DIFFERENTIAL/PLATELET
Absolute Lymphocytes: 1562 {cells}/uL (ref 850–3900)
Absolute Monocytes: 252 {cells}/uL (ref 200–950)
Basophils Absolute: 29 {cells}/uL (ref 0–200)
Basophils Relative: 0.7 %
Eosinophils Absolute: 139 {cells}/uL (ref 15–500)
Eosinophils Relative: 3.3 %
HCT: 44.9 % (ref 35.0–45.0)
Hemoglobin: 13.9 g/dL (ref 11.7–15.5)
MCH: 24.5 pg — ABNORMAL LOW (ref 27.0–33.0)
MCHC: 31 g/dL — ABNORMAL LOW (ref 32.0–36.0)
MCV: 79.2 fL — ABNORMAL LOW (ref 80.0–100.0)
MPV: 12.5 fL (ref 7.5–12.5)
Monocytes Relative: 6 %
Neutro Abs: 2218 {cells}/uL (ref 1500–7800)
Neutrophils Relative %: 52.8 %
Platelets: 283 10*3/uL (ref 140–400)
RBC: 5.67 10*6/uL — ABNORMAL HIGH (ref 3.80–5.10)
RDW: 14.5 % (ref 11.0–15.0)
Total Lymphocyte: 37.2 %
WBC: 4.2 10*3/uL (ref 3.8–10.8)

## 2023-12-14 LAB — LIPID PANEL
Cholesterol: 206 mg/dL — ABNORMAL HIGH (ref <200)
HDL: 81 mg/dL (ref >=50)
LDL Cholesterol (Calc): 106 mg/dL — ABNORMAL HIGH
Non-HDL Cholesterol (Calc): 125 mg/dL (ref <130)
Total CHOL/HDL Ratio: 2.5 (calc) (ref <5.0)
Triglycerides: 98 mg/dL (ref <150)

## 2023-12-14 LAB — HEMOGLOBIN A1C
Hgb A1c MFr Bld: 6.4 %{Hb} — ABNORMAL HIGH (ref <5.7)
Mean Plasma Glucose: 137 mg/dL
eAG (mmol/L): 7.6 mmol/L

## 2023-12-14 NOTE — Progress Notes (Signed)
 Glucose is 113. Rest of CMP WNL.  CBC stable  Hgb A1C is 6.4%.  Total cholesterol is borderline elevated-206. LDL is borderline elevated-106. Rest of lipid panel WNL.

## 2023-12-16 NOTE — Progress Notes (Signed)
Please forward results to PCP or gynecologist/whoever the ordering provider is. Thank you!

## 2023-12-17 NOTE — Progress Notes (Unsigned)
Office Visit Note  Patient: Taylor Kirby             Date of Birth: 1956-06-16           MRN: [id number]             PCP: Laurann Montana, MD Referring: Laurann Montana, MD Visit Date: 12/30/2023 Occupation: @GUAROCC @  Subjective:  Medication monitoring   History of Present Illness: Taylor Kirby is a 68 y.o. female with history of psoriatic arthritis.  Patient remains on  Cosentyx 300 mg sq injections every 4 weeks.  She is tolerating Cosentyx without any side effects or injection site reactions.  Has not had any recent gaps in therapy.  Patient denies any signs or symptoms of a psoriatic arthritis flare.  She has not been experiencing any morning stiffness, nocturnal pain, or difficulty with ADLs.  She denies any active psoriasis at this time.  Patient is planning on receiving the Shingrix vaccine at an upcoming appointment with her PCP.  Patient states that she has continued to notice unintentional weight loss.  Patient states that she has not been exercising recently and has not made any other drastic dietary changes.  She will be following up with her PCP for further evaluation.  She has been under stress caring for her elderly mother with dementia.    Activities of Daily Living:  Patient reports morning stiffness for 0 minute.   Patient Denies nocturnal pain.  Difficulty dressing/grooming: Denies Difficulty climbing stairs: Denies Difficulty getting out of chair: Denies Difficulty using hands for taps, buttons, cutlery, and/or writing: Denies  Review of Systems  Constitutional:  Negative for fatigue.  HENT:  Negative for mouth sores and mouth dryness.   Eyes:  Negative for dryness.  Respiratory:  Negative for shortness of breath.   Cardiovascular:  Negative for chest pain and palpitations.  Gastrointestinal:  Negative for blood in stool, constipation and diarrhea.  Endocrine: Negative for increased urination.  Genitourinary:  Negative for involuntary urination.   Musculoskeletal:  Positive for myalgias, muscle weakness and myalgias. Negative for joint pain, gait problem, joint pain, joint swelling, morning stiffness and muscle tenderness.  Skin:  Negative for color change, rash, hair loss and sensitivity to sunlight.  Allergic/Immunologic: Negative for susceptible to infections.  Neurological:  Negative for dizziness and headaches.  Hematological:  Negative for swollen glands.  Psychiatric/Behavioral:  Negative for depressed mood and sleep disturbance. The patient is not nervous/anxious.     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   High risk medication use 10/03/2016   Psoriatic arthritis (HCC) 10/25/2015    Past Medical History:  Diagnosis Date   Allergy    Anemia    Chronic kidney disease    Heart murmur    Osteoporosis    Rheumatoid arthritis (HCC)     Family History  Problem Relation Age of Onset   Hyperlipidemia Mother    Hypertension Mother    Stroke Mother    Hyperlipidemia Father    Cancer Father    Heart attack Father    Hypertension Sister    Heart disease Maternal Grandmother    Cancer Maternal Grandfather    Hypertension Paternal Grandmother    Past Surgical History:  Procedure Laterality Date   TUBAL LIGATION     Social History  Social History Narrative   Not on file   Immunization History  Administered Date(s) Administered   [employer] Booster Vaccination 11/19/2020   Moderna Sars-Covid-2 Vaccination 01/27/2020, 02/24/2020     Objective: Vital Signs: BP 106/74 (BP Location: Left Arm, Patient Position: Sitting, Cuff Size: Normal)   Pulse 72   Ht 5\' 4"  (1.626 m)   Wt 111 lb (50.3 kg)   BMI 19.05 kg/m    Physical Exam Vitals and nursing note reviewed.  Constitutional:      Appearance: She is well-developed.   HENT:     Head: Normocephalic and atraumatic.  Eyes:     Conjunctiva/sclera: Conjunctivae normal.  Cardiovascular:     Rate and Rhythm: Normal rate and regular rhythm.     Heart sounds: Normal heart sounds.  Pulmonary:     Effort: Pulmonary effort is normal.     Breath sounds: Normal breath sounds.  Abdominal:     General: Bowel sounds are normal.     Palpations: Abdomen is soft.  Musculoskeletal:     Cervical back: Normal range of motion.  Lymphadenopathy:     Cervical: No cervical adenopathy.  Skin:    General: Skin is warm and dry.     Capillary Refill: Capillary refill takes less than 2 seconds.  Neurological:     Mental Status: She is alert and oriented to person, place, and time.  Psychiatric:        Behavior: Behavior normal.      Musculoskeletal Exam: C-spine, thoracic spine, lumbar spine and good range of motion.  Shoulder joints have good range of motion.  Flexion contracture of the right elbow.  Left elbow has good range of motion.  Limited extension of both wrist joints.  Arthritis mutilans of both thumbs.  PIP and DIP thickening with subluxation.  Hip joints have good range of motion with no groin pain.  Knee joints have good range of motion no warmth or effusion.  Ankle joints have good range of motion with no tenderness or joint swelling.  CDAI Exam: CDAI Score: -- Patient Global: --; Provider Global: -- Swollen: --; Tender: -- Joint Exam 12/30/2023   No joint exam has been documented for this visit   There is currently no information documented on the homunculus. Go to the Rheumatology activity and complete the homunculus joint exam.  Investigation: No additional findings.  Imaging: No results found.  Recent Labs: Lab Results  Component Value Date   WBC 4.2 12/13/2023   HGB 13.9 12/13/2023   PLT 283 12/13/2023   NA 138 12/13/2023   K 4.3 12/13/2023   CL 101 12/13/2023   CO2 28 12/13/2023   GLUCOSE 113 (H) 12/13/2023   BUN 24 12/13/2023    CREATININE 0.79 12/13/2023   BILITOT 0.3 12/13/2023   ALKPHOS 95 02/04/2018   AST 22 12/13/2023   ALT 16 12/13/2023   PROT 7.6 12/13/2023   ALBUMIN 3.7 02/04/2018   CALCIUM 10.1 12/13/2023   GFRAA 85 04/10/2021   QFTBGOLDPLUS NEGATIVE 02/01/2023    Speciality Comments: Discontinued methotrexate September 2021 due to low WBC Fosamax March 2019 till March 2023 with few gaps.  Procedures:  No procedures performed Allergies: Erythromycin, Penicillins, and Sulfa antibiotics   Assessment / Plan:     Visit Diagnoses: Psoriatic arthritis (HCC) - Hx of arthritis mutilans-Bilateral thumbs: No synovitis or dactylitis noted on examination today.  She has not had any signs or symptoms of psoriatic arthritis flare.  She has not been experiencing any morning  stiffness, nocturnal pain, or difficulty with ADLs.  She has clinically been doing well on Cosentyx 300 mg sq injections every 28 days.  She continues to tolerate Cosentyx without any side effects or injection site reactions.  No recent or recurrent infections. No active psoriasis at this time.  No evidence of Achilles tendinitis or plantar fasciitis.  Patient will remain on Cosentyx as monotherapy.  She was advised to notify us if she develops signs or symptoms of a flare.  She will follow-up in the office in 5 months or sooner if needed.  Other psoriasis: No active psoriasis at this time.  She remains on Cosentyx as prescribed.  High risk medication use - Cosentyx 300 mg sq injections every 28 days. Initiated Cosentyx on 06/30/22. CBC and CMP updated on 12/13/23.  Her next lab work will be due in April and every 3 months.  TB Gold negative on 02/01/23. Future order for TB gold placed today  No recent or recurrent infections. Discussed the importance of holding cosentyx if she develops signs or symptoms of an infection and to resume once the infection has completely cleared.   Contracture of elbow joint, right: Unchanged.  No tenderness or  inflammation noted today.  Age-related osteoporosis without current pathological fracture - Fosamax was discontinued in March 2023 after her repeat DEXA scan.  DEXA updated on 01/28/2022: Lumbar spine BMD 0.831 with T score -2.1. Due to update DEXA in March 2025.  Vitamin D deficiency: Vitamin D was 41 on 03/17/2023.  Other medical conditions are listed as follows:  Renal calcinosis  Essential hypertension: Blood pressure was 106/74 today in the office.  Vitamin B12 deficiency  History of anemia: CBC updated on 12/13/23: RBC count is borderline elevated-5.67.  Hemoglobin and hematocrit are WNL.  Lipid screening: Cholesterol was 206, HDL 81, triglycerides 98, LDL 106 on 12/13/23.    Diabetes mellitus screening: Hemoglobin A1c was 6.4% on 12/13/2023.  Orders: Orders Placed This Encounter  Procedures   QuantiFERON-TB Gold Plus   No orders of the defined types were placed in this encounter.    Follow-Up Instructions: Return in about 5 months (around 05/29/2024) for Psoriatic arthritis.   Gearldine Bienenstock, PA-C  Note - This record has been created using Dragon software.  Chart creation errors have been sought, but may not always  have been located. Such creation errors do not reflect on  the standard of medical care.

## 2023-12-20 ENCOUNTER — Ambulatory Visit: Payer: Medicare PPO | Admitting: Physician Assistant

## 2023-12-20 DIAGNOSIS — L408 Other psoriasis: Secondary | ICD-10-CM

## 2023-12-20 DIAGNOSIS — L4050 Arthropathic psoriasis, unspecified: Secondary | ICD-10-CM

## 2023-12-20 DIAGNOSIS — I10 Essential (primary) hypertension: Secondary | ICD-10-CM

## 2023-12-20 DIAGNOSIS — Z131 Encounter for screening for diabetes mellitus: Secondary | ICD-10-CM

## 2023-12-20 DIAGNOSIS — M24521 Contracture, right elbow: Secondary | ICD-10-CM

## 2023-12-20 DIAGNOSIS — Z13220 Encounter for screening for lipoid disorders: Secondary | ICD-10-CM

## 2023-12-20 DIAGNOSIS — Z79899 Other long term (current) drug therapy: Secondary | ICD-10-CM

## 2023-12-20 DIAGNOSIS — Z862 Personal history of diseases of the blood and blood-forming organs and certain disorders involving the immune mechanism: Secondary | ICD-10-CM

## 2023-12-20 DIAGNOSIS — E538 Deficiency of other specified B group vitamins: Secondary | ICD-10-CM

## 2023-12-20 DIAGNOSIS — M810 Age-related osteoporosis without current pathological fracture: Secondary | ICD-10-CM

## 2023-12-20 DIAGNOSIS — E559 Vitamin D deficiency, unspecified: Secondary | ICD-10-CM

## 2023-12-23 ENCOUNTER — Other Ambulatory Visit: Payer: Self-pay

## 2023-12-24 NOTE — Progress Notes (Signed)
Patient no longer eligible for Cosentyx PAP. She understands that monthly copay is $100. Will continue Cosentyx 300mg  SQ every 4 weeks as with ointments for psoriasis  Chesley Mires, PharmD, MPH, BCPS, CPP Clinical Pharmacist (Rheumatology and Pulmonology)

## 2023-12-30 ENCOUNTER — Encounter: Payer: Self-pay | Admitting: Physician Assistant

## 2023-12-30 ENCOUNTER — Ambulatory Visit: Payer: Medicare PPO | Attending: Physician Assistant | Admitting: Physician Assistant

## 2023-12-30 ENCOUNTER — Other Ambulatory Visit: Payer: Self-pay

## 2023-12-30 ENCOUNTER — Other Ambulatory Visit (HOSPITAL_COMMUNITY): Payer: Self-pay

## 2023-12-30 VITALS — BP 106/74 | HR 72 | Ht 64.0 in | Wt 111.0 lb

## 2023-12-30 DIAGNOSIS — Z111 Encounter for screening for respiratory tuberculosis: Secondary | ICD-10-CM

## 2023-12-30 DIAGNOSIS — M24521 Contracture, right elbow: Secondary | ICD-10-CM | POA: Diagnosis not present

## 2023-12-30 DIAGNOSIS — Z13220 Encounter for screening for lipoid disorders: Secondary | ICD-10-CM

## 2023-12-30 DIAGNOSIS — I10 Essential (primary) hypertension: Secondary | ICD-10-CM

## 2023-12-30 DIAGNOSIS — L408 Other psoriasis: Secondary | ICD-10-CM | POA: Diagnosis not present

## 2023-12-30 DIAGNOSIS — L4050 Arthropathic psoriasis, unspecified: Secondary | ICD-10-CM | POA: Diagnosis not present

## 2023-12-30 DIAGNOSIS — Z862 Personal history of diseases of the blood and blood-forming organs and certain disorders involving the immune mechanism: Secondary | ICD-10-CM | POA: Diagnosis not present

## 2023-12-30 DIAGNOSIS — M810 Age-related osteoporosis without current pathological fracture: Secondary | ICD-10-CM

## 2023-12-30 DIAGNOSIS — E559 Vitamin D deficiency, unspecified: Secondary | ICD-10-CM | POA: Diagnosis not present

## 2023-12-30 DIAGNOSIS — Z79899 Other long term (current) drug therapy: Secondary | ICD-10-CM

## 2023-12-30 DIAGNOSIS — E538 Deficiency of other specified B group vitamins: Secondary | ICD-10-CM | POA: Diagnosis not present

## 2023-12-30 DIAGNOSIS — Z131 Encounter for screening for diabetes mellitus: Secondary | ICD-10-CM

## 2023-12-30 DIAGNOSIS — N29 Other disorders of kidney and ureter in diseases classified elsewhere: Secondary | ICD-10-CM

## 2023-12-30 NOTE — Progress Notes (Signed)
Specialty Pharmacy Refill Coordination Note  Taylor Kirby is a 68 y.o. female contacted today regarding refills of specialty medication(s) Secukinumab (Cosentyx Sensoready (300 MG))   Patient requested Delivery   Delivery date: 01/05/24   Verified address: [address]   [name] [address]   Medication will be filled on 01/04/24.

## 2023-12-30 NOTE — Patient Instructions (Addendum)
Standing Labs We placed an order today for your standing lab work.   Please have your standing labs drawn in mid-April and every 3 months    Please have your labs drawn 2 weeks prior to your appointment so that the provider can discuss your lab results at your appointment, if possible.  Please note that you may see your imaging and lab results in MyChart before we have reviewed them. We will contact you once all results are reviewed. Please allow our office up to 72 hours to thoroughly review all of the results before contacting the office for clarification of your results.  WALK-IN LAB HOURS  Monday through Thursday from 8:00 am -12:30 pm and 1:00 pm-5:00 pm and Friday from 8:00 am-12:00 pm.  Patients with office visits requiring labs will be seen before walk-in labs.  You may encounter longer than normal wait times. Please allow additional time. Wait times may be shorter on  Monday and Thursday afternoons.  We do not book appointments for walk-in labs. We appreciate your patience and understanding with our staff.   Labs are drawn by [facility]. Please bring your co-pay at the time of your lab draw.  You may receive a bill from [facility] for your lab work.  Please note if you are on Hydroxychloroquine and and an order has been placed for a Hydroxychloroquine level,  you will need to have it drawn 4 hours or more after your last dose.  If you wish to have your labs drawn at another location, please call the office 24 hours in advance so we can fax the orders.  The office is located at [address], [place], [place] [address]   If you have any questions regarding directions or hours of operation,  please call [phone].   As a reminder, please drink plenty of water prior to coming for your lab work. Thanks!

## 2024-01-03 DIAGNOSIS — Z1231 Encounter for screening mammogram for malignant neoplasm of breast: Secondary | ICD-10-CM | POA: Diagnosis not present

## 2024-01-12 DIAGNOSIS — N182 Chronic kidney disease, stage 2 (mild): Secondary | ICD-10-CM | POA: Diagnosis not present

## 2024-01-12 DIAGNOSIS — R0982 Postnasal drip: Secondary | ICD-10-CM | POA: Diagnosis not present

## 2024-01-12 DIAGNOSIS — R413 Other amnesia: Secondary | ICD-10-CM | POA: Diagnosis not present

## 2024-01-12 DIAGNOSIS — L4050 Arthropathic psoriasis, unspecified: Secondary | ICD-10-CM | POA: Diagnosis not present

## 2024-01-12 DIAGNOSIS — R7303 Prediabetes: Secondary | ICD-10-CM | POA: Diagnosis not present

## 2024-01-12 DIAGNOSIS — R636 Underweight: Secondary | ICD-10-CM | POA: Diagnosis not present

## 2024-01-12 DIAGNOSIS — F439 Reaction to severe stress, unspecified: Secondary | ICD-10-CM | POA: Diagnosis not present

## 2024-01-12 DIAGNOSIS — I129 Hypertensive chronic kidney disease with stage 1 through stage 4 chronic kidney disease, or unspecified chronic kidney disease: Secondary | ICD-10-CM | POA: Diagnosis not present

## 2024-01-12 DIAGNOSIS — M810 Age-related osteoporosis without current pathological fracture: Secondary | ICD-10-CM | POA: Diagnosis not present

## 2024-01-28 ENCOUNTER — Other Ambulatory Visit (HOSPITAL_COMMUNITY): Payer: Self-pay

## 2024-01-31 ENCOUNTER — Other Ambulatory Visit: Payer: Self-pay

## 2024-01-31 NOTE — Progress Notes (Signed)
 Specialty Pharmacy Refill Coordination Note  Taylor Kirby is a 68 y.o. female contacted today regarding refills of specialty medication(s) Secukinumab (Cosentyx Sensoready (300 MG))   Patient requested Delivery   Delivery date: 02/03/24   Verified address: [address]   [place] [address]   Medication will be filled on 02/02/24.

## 2024-02-02 DIAGNOSIS — M810 Age-related osteoporosis without current pathological fracture: Secondary | ICD-10-CM | POA: Diagnosis not present

## 2024-02-02 DIAGNOSIS — M8588 Other specified disorders of bone density and structure, other site: Secondary | ICD-10-CM | POA: Diagnosis not present

## 2024-02-02 LAB — HM DEXA SCAN

## 2024-02-23 ENCOUNTER — Other Ambulatory Visit: Payer: Self-pay

## 2024-02-23 DIAGNOSIS — J1089 Influenza due to other identified influenza virus with other manifestations: Secondary | ICD-10-CM | POA: Diagnosis not present

## 2024-02-23 DIAGNOSIS — R197 Diarrhea, unspecified: Secondary | ICD-10-CM | POA: Diagnosis not present

## 2024-02-25 DIAGNOSIS — J101 Influenza due to other identified influenza virus with other respiratory manifestations: Secondary | ICD-10-CM | POA: Diagnosis not present

## 2024-02-25 DIAGNOSIS — R195 Other fecal abnormalities: Secondary | ICD-10-CM | POA: Diagnosis not present

## 2024-02-28 ENCOUNTER — Other Ambulatory Visit (HOSPITAL_COMMUNITY): Payer: Self-pay

## 2024-02-29 ENCOUNTER — Other Ambulatory Visit (HOSPITAL_COMMUNITY): Payer: Self-pay

## 2024-02-29 ENCOUNTER — Other Ambulatory Visit: Payer: Self-pay | Admitting: Rheumatology

## 2024-02-29 ENCOUNTER — Other Ambulatory Visit: Payer: Self-pay

## 2024-02-29 DIAGNOSIS — Z79899 Other long term (current) drug therapy: Secondary | ICD-10-CM

## 2024-02-29 DIAGNOSIS — L4050 Arthropathic psoriasis, unspecified: Secondary | ICD-10-CM

## 2024-02-29 DIAGNOSIS — L408 Other psoriasis: Secondary | ICD-10-CM

## 2024-02-29 MED ORDER — COSENTYX SENSOREADY (300 MG) 150 MG/ML ~~LOC~~ SOAJ
300.0000 mg | SUBCUTANEOUS | 2 refills | Status: DC
  Filled 2024-03-01: qty 2, 28d supply, fill #0
  Filled 2024-03-23: qty 2, 28d supply, fill #1
  Filled 2024-04-20: qty 2, 28d supply, fill #2

## 2024-02-29 NOTE — Telephone Encounter (Signed)
 Last Fill: 12/03/2023  Labs: 12/13/2023 Glucose is 113. Rest of CMP WNL. CBC stable Hgb A1C is 6.4%. Total cholesterol is borderline elevated-206. LDL is borderline elevated-106. Rest of lipid panel WNL.  TB Gold: 02/01/2023 Neg (Patient to update with standing labs in April 2025)   Next Visit: 05/29/2024  Last Visit: 12/30/2023  JY:[id number] arthritis   Current Dose per office note 12/30/2023: Cosentyx 300 mg sq injections every 28 days.   Okay to refill Cosentyx?

## 2024-02-29 NOTE — Progress Notes (Signed)
 Specialty Pharmacy Refill Coordination Note  Taylor Kirby is a 68 y.o. female contacted today regarding refills of specialty medication(s) Secukinumab (Cosentyx Sensoready (300 MG))   Patient requested Delivery   Delivery date: 03/10/24   Verified address: [address]   [place] [address]   Medication will be filled on 03/09/24. This fill date is pending response to refill request from provider. Patient is aware and if they have not received fill by intended date they must follow up with pharmacy.

## 2024-03-01 ENCOUNTER — Other Ambulatory Visit: Payer: Self-pay

## 2024-03-02 ENCOUNTER — Other Ambulatory Visit: Payer: Self-pay

## 2024-03-23 ENCOUNTER — Other Ambulatory Visit: Payer: Self-pay

## 2024-03-23 ENCOUNTER — Other Ambulatory Visit: Payer: Self-pay | Admitting: *Deleted

## 2024-03-23 ENCOUNTER — Other Ambulatory Visit (HOSPITAL_COMMUNITY): Payer: Self-pay

## 2024-03-23 DIAGNOSIS — Z79899 Other long term (current) drug therapy: Secondary | ICD-10-CM

## 2024-03-23 DIAGNOSIS — Z111 Encounter for screening for respiratory tuberculosis: Secondary | ICD-10-CM

## 2024-03-23 NOTE — Progress Notes (Signed)
 Specialty Pharmacy Refill Coordination Note  Taylor Kirby is a 68 y.o. female contacted today regarding refills of specialty medication(s) Secukinumab  (Cosentyx  Sensoready (300 MG))   Patient requested Delivery   Delivery date: 03/30/24   Verified address: [address]   [place] [address]   Medication will be filled on 03/29/24.

## 2024-03-24 NOTE — Progress Notes (Signed)
 RBC count remains slightly elevated but has improved.   MCH and MCH are low but improving. Rest of CBC WNL.  CMP WNL.

## 2024-03-25 LAB — COMPREHENSIVE METABOLIC PANEL WITH GFR
AG Ratio: 1.1 (calc) (ref 1.0–2.5)
ALT: 15 U/L (ref 6–29)
AST: 19 U/L (ref 10–35)
Albumin: 3.9 g/dL (ref 3.6–5.1)
Alkaline phosphatase (APISO): 71 U/L (ref 37–153)
BUN: 20 mg/dL (ref 7–25)
CO2: 29 mmol/L (ref 20–32)
Calcium: 9.5 mg/dL (ref 8.6–10.4)
Chloride: 104 mmol/L (ref 98–110)
Creat: 0.79 mg/dL (ref 0.50–1.05)
Globulin: 3.4 g/dL (ref 1.9–3.7)
Glucose, Bld: 90 mg/dL (ref 65–99)
Potassium: 4.8 mmol/L (ref 3.5–5.3)
Sodium: 141 mmol/L (ref 135–146)
Total Bilirubin: 0.4 mg/dL (ref 0.2–1.2)
Total Protein: 7.3 g/dL (ref 6.1–8.1)
eGFR: 82 mL/min/{1.73_m2} (ref >=60)

## 2024-03-25 LAB — CBC WITH DIFFERENTIAL/PLATELET
Absolute Lymphocytes: 1452 {cells}/uL (ref 850–3900)
Absolute Monocytes: 252 {cells}/uL (ref 200–950)
Basophils Absolute: 20 {cells}/uL (ref 0–200)
Basophils Relative: 0.5 %
Eosinophils Absolute: 112 {cells}/uL (ref 15–500)
Eosinophils Relative: 2.8 %
HCT: 43.5 % (ref 35.0–45.0)
Hemoglobin: 13.1 g/dL (ref 11.7–15.5)
MCH: 24.6 pg — ABNORMAL LOW (ref 27.0–33.0)
MCHC: 30.1 g/dL — ABNORMAL LOW (ref 32.0–36.0)
MCV: 81.6 fL (ref 80.0–100.0)
MPV: 11.8 fL (ref 7.5–12.5)
Monocytes Relative: 6.3 %
Neutro Abs: 2164 {cells}/uL (ref 1500–7800)
Neutrophils Relative %: 54.1 %
Platelets: 257 10*3/uL (ref 140–400)
RBC: 5.33 10*6/uL — ABNORMAL HIGH (ref 3.80–5.10)
RDW: 14.2 % (ref 11.0–15.0)
Total Lymphocyte: 36.3 %
WBC: 4 10*3/uL (ref 3.8–10.8)

## 2024-03-25 LAB — QUANTIFERON-TB GOLD PLUS
Mitogen-NIL: 8.23 [IU]/mL
NIL: 0.05 [IU]/mL
QuantiFERON-TB Gold Plus: NEGATIVE
TB1-NIL: <0 [IU]/mL
TB2-NIL: <0 [IU]/mL

## 2024-03-27 NOTE — Progress Notes (Signed)
 TB gold negative

## 2024-03-29 ENCOUNTER — Other Ambulatory Visit: Payer: Self-pay

## 2024-04-03 DIAGNOSIS — N182 Chronic kidney disease, stage 2 (mild): Secondary | ICD-10-CM | POA: Diagnosis not present

## 2024-04-03 DIAGNOSIS — E559 Vitamin D deficiency, unspecified: Secondary | ICD-10-CM | POA: Diagnosis not present

## 2024-04-05 DIAGNOSIS — Z0000 Encounter for general adult medical examination without abnormal findings: Secondary | ICD-10-CM | POA: Diagnosis not present

## 2024-04-05 DIAGNOSIS — R413 Other amnesia: Secondary | ICD-10-CM | POA: Diagnosis not present

## 2024-04-05 DIAGNOSIS — N182 Chronic kidney disease, stage 2 (mild): Secondary | ICD-10-CM | POA: Diagnosis not present

## 2024-04-05 DIAGNOSIS — D849 Immunodeficiency, unspecified: Secondary | ICD-10-CM | POA: Diagnosis not present

## 2024-04-05 DIAGNOSIS — E559 Vitamin D deficiency, unspecified: Secondary | ICD-10-CM | POA: Diagnosis not present

## 2024-04-05 DIAGNOSIS — I129 Hypertensive chronic kidney disease with stage 1 through stage 4 chronic kidney disease, or unspecified chronic kidney disease: Secondary | ICD-10-CM | POA: Diagnosis not present

## 2024-04-05 DIAGNOSIS — R7303 Prediabetes: Secondary | ICD-10-CM | POA: Diagnosis not present

## 2024-04-05 DIAGNOSIS — R0982 Postnasal drip: Secondary | ICD-10-CM | POA: Diagnosis not present

## 2024-04-05 DIAGNOSIS — L4050 Arthropathic psoriasis, unspecified: Secondary | ICD-10-CM | POA: Diagnosis not present

## 2024-04-05 DIAGNOSIS — I351 Nonrheumatic aortic (valve) insufficiency: Secondary | ICD-10-CM | POA: Diagnosis not present

## 2024-04-05 DIAGNOSIS — J4530 Mild persistent asthma, uncomplicated: Secondary | ICD-10-CM | POA: Diagnosis not present

## 2024-04-06 DIAGNOSIS — R7303 Prediabetes: Secondary | ICD-10-CM | POA: Diagnosis not present

## 2024-04-06 DIAGNOSIS — N182 Chronic kidney disease, stage 2 (mild): Secondary | ICD-10-CM | POA: Diagnosis not present

## 2024-04-06 DIAGNOSIS — I129 Hypertensive chronic kidney disease with stage 1 through stage 4 chronic kidney disease, or unspecified chronic kidney disease: Secondary | ICD-10-CM | POA: Diagnosis not present

## 2024-04-06 DIAGNOSIS — Z713 Dietary counseling and surveillance: Secondary | ICD-10-CM | POA: Diagnosis not present

## 2024-04-12 DIAGNOSIS — I129 Hypertensive chronic kidney disease with stage 1 through stage 4 chronic kidney disease, or unspecified chronic kidney disease: Secondary | ICD-10-CM | POA: Diagnosis not present

## 2024-04-12 DIAGNOSIS — R809 Proteinuria, unspecified: Secondary | ICD-10-CM | POA: Diagnosis not present

## 2024-04-12 DIAGNOSIS — K625 Hemorrhage of anus and rectum: Secondary | ICD-10-CM | POA: Diagnosis not present

## 2024-04-12 DIAGNOSIS — K648 Other hemorrhoids: Secondary | ICD-10-CM | POA: Diagnosis not present

## 2024-04-12 DIAGNOSIS — N182 Chronic kidney disease, stage 2 (mild): Secondary | ICD-10-CM | POA: Diagnosis not present

## 2024-04-12 DIAGNOSIS — E559 Vitamin D deficiency, unspecified: Secondary | ICD-10-CM | POA: Diagnosis not present

## 2024-04-18 ENCOUNTER — Other Ambulatory Visit: Payer: Self-pay

## 2024-04-20 ENCOUNTER — Other Ambulatory Visit (HOSPITAL_COMMUNITY): Payer: Self-pay

## 2024-04-20 ENCOUNTER — Other Ambulatory Visit: Payer: Self-pay

## 2024-04-20 NOTE — Progress Notes (Signed)
 Specialty Pharmacy Refill Coordination Note  Taylor Kirby is a 68 y.o. female contacted today regarding refills of specialty medication(s) Secukinumab  (Cosentyx  Sensoready (300 MG))   Patient requested Delivery   Delivery date: 04/21/24   Verified address: [address]   [place] [address]   Medication will be filled on 04/20/24.

## 2024-04-28 DIAGNOSIS — N182 Chronic kidney disease, stage 2 (mild): Secondary | ICD-10-CM | POA: Diagnosis not present

## 2024-04-28 DIAGNOSIS — I129 Hypertensive chronic kidney disease with stage 1 through stage 4 chronic kidney disease, or unspecified chronic kidney disease: Secondary | ICD-10-CM | POA: Diagnosis not present

## 2024-04-29 DIAGNOSIS — I129 Hypertensive chronic kidney disease with stage 1 through stage 4 chronic kidney disease, or unspecified chronic kidney disease: Secondary | ICD-10-CM | POA: Diagnosis not present

## 2024-04-29 DIAGNOSIS — L4050 Arthropathic psoriasis, unspecified: Secondary | ICD-10-CM | POA: Diagnosis not present

## 2024-04-29 DIAGNOSIS — F4321 Adjustment disorder with depressed mood: Secondary | ICD-10-CM | POA: Diagnosis not present

## 2024-04-29 DIAGNOSIS — M810 Age-related osteoporosis without current pathological fracture: Secondary | ICD-10-CM | POA: Diagnosis not present

## 2024-05-10 ENCOUNTER — Encounter: Attending: Psychology | Admitting: Psychology

## 2024-05-10 ENCOUNTER — Encounter: Payer: Self-pay | Admitting: Psychology

## 2024-05-10 DIAGNOSIS — R413 Other amnesia: Secondary | ICD-10-CM | POA: Insufficient documentation

## 2024-05-10 NOTE — Progress Notes (Signed)
 NEUROPSYCHOLOGICAL EVALUATION [place]. [facility]  Physical Medicine and Rehabilitation     Patient: Taylor Kirby  MRN: [id number] DOB: 1956-06-15  Age: 68 y.o. Sex: female  Race/Ethnicity: Black or African American  Years of Education: 16  Referring Provider: Victorio Grave, MD  Provider/Clinical Neuropsychologist: Loletta Ripple, PsyD  Date of Service: 05/10/2024 Start Time: 8 AM End Time: 10 AM  Location of Service:  [facility] Physical Medicine & Rehabilitation Department [place]. [facility] [address], [place] [place], [place] [address] Phone: [phone]  Billing Code/Service:            96116/96121 Individuals Present: Patient was seen unaccompanied, in-person, by the provider. 1 hour and 15 minutes spent in face-to-face clinical interview and remaining 45 minutes was spent in record review, documentation, and testing protocol construction.    PATIENT CONSENT AND CONFIDENTIALITY The patient's understanding of the reason for referral was intact. Discussed limits of confidentiality including, but not limited to, posting of final evaluation report in the patient's electronic medical record for both the patient and for the referring provider and appropriate medical professionals. Patient was given the opportunity to have their questions answered. The neuropsychological evaluation process was discussed with the patient and they consented to proceed with the evaluation.  Consent for Evaluation and Treatment: Signed: Yes Explanation of Privacy Policies: Signed: Yes Discussion of Confidentiality Limits: Yes  REASON FOR REFERRAL:The patient was referred for neuropsychological evaluation by by her primary care provider due to concerns for memory loss. Initial differential included MCI versus anxiety.  Notes from referring provider indicated that the patient reported some improvement in short-term memory difficulties during the August 2024 visit  relative to those reported in February of that year.  Stress levels are reportedly improved between those two time points as well.  Upon interview, the patient acknowledged that her concerns regarding memory loss were slightly less than previously.  She noted some connection between significant stressors/life events and her symptoms.  That being said, as a caregiver of a parent with dementia the patient indicated she wanted to undergo evaluation so she could know if there were any early signs in her self, and also to determine what kind of things she might do to improve memory.  HISTORY OF PRESENTING CONCERNS:  Cognitive Symptom Onset & Course: The patient reported first noticing cognitive changes in 2021 within the context of the death of her father.  She indicated there was some improvement over time but then declines in the last few years.  She described significant stress related to her caregiving responsibilities that increase over this timeframe. Symptom course has been somewhat variable, increasing when responsibilities/life demands increase..   Current Cognitive Complaints:  Memory: She endorsed frequent difficulties remembering recent events and occasional difficulties remembering conversations.  She reported only occasional difficulties misplacing things.  She denied being told that she repeats herself unintentionally and has no difficulties with managing medical appointments or medications.  Processing Speed: She reported subjective declines in processing speed that has been present for the past 2 years and which is variable with respect to stability. Attention & Concentration: She denied problems with attention/concentration. Language: She endorsed some word-finding difficulties but denied other problems with expressive language.  She denied any problems with receptive speech.  She hide any difficulties with basic mental arithmetic. Visual-Spatial: She denied any problems with getting lost in  familiar places or judging space while driving. Executive Functioning: The patient endorsed some tendencies towards either indecision  or hesitancy to assert herself.  She is intact with respect to basic decision making and problem solving capabilities and an absolute sense.  She denied problems with organization and this appears to be an area of strength for her.  She denied any significant changes in her personality or behavior.  Motor/Sensory Complaints:   Sensory changes: Denied any changes in sense of smell or taste.  Hearing is reportedly good.  No significant difficulties with vision. Balance/coordination difficulties: None. Frequent instances of dizziness/vertigo: None. Other motor difficulties: None.  Emotional and Behavioral Functioning: The patient engaged in individual counseling for about 3 months.  She indicated she stopped about 8 months ago as her therapist indicated that she had met the treatment target goals.  Her therapist indicated that she could reach out to connect again if needed.  The patient described benefits from the therapy and work involving boundaries and delegating work to others, and working to better balance things to facilitate greater self-care.  The patient indicated that her therapist felt that she did have some depressive symptoms. Depression: No current indications of depression.  Denied frequent feelings of low mood sleep difficulties, low energy, anhedonia, or suicidal ideation. Anxiety: Denied any generalized worry or broad difficulties with attention and relaxation.  Endorsed occasional anxiety related responses which are congruent to the situation. Other: Denied any current or past suicidal ideation, homicidal ideation, hallucinations, paranoia, symptoms of mania.  Has no previous psychiatric treatment history beyond that noted above.  Significant life events involve the loss of her father in 2021, who she continues to grieve.  The patient's mother is reported  to have dementia and the patient provides support in coordinating services/finances and is even involved in property management of properties owned by her mother.  The patient described engaging in minimal hobbies or activities for herself and recognizes that she is fairly isolated, socially, with respect to connections outside her family.  Sleep: Denied difficulties with sleep onset or returning to sleep upon waking.  Denied any RBD's.  Typically sleeps about 8 to 10 hours per night. Appetite: Improving. Caffeine: Minimal. Alcohol Use: None. Tobacco Use: None. Recreational Substance Use: None.  Level of Functional Independence: The patient is fully independent with both basic and instrumental activities of daily living.  She is responsible for many daily activities involved in the care of her mother in addition to those of her own life.  She is able to execute these responsibilities effectively.  Medical History/Record Review: The patient denied any history of stroke, heart attack, TBI, chemotherapy or radiation treatment, high blood pressure and high cholesterol, seizures, or chronic pain.  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  Family Neurologic/Medical Hx: Dementia in mother, alive, 60 years old.  Family History  Problem Relation Age of Onset   Hyperlipidemia Mother    Hypertension Mother    Stroke Mother    Hyperlipidemia Father    Cancer  Father     Heart attack Father    Hypertension Sister    Heart disease Maternal Grandmother    Cancer Maternal Grandfather    Hypertension Paternal Grandmother     Medications:  albuterol  (PROVENTIL ) (2.5 MG/3ML) 0.083% nebulizer solution betamethasone valerate (VALISONE) 0.1 % cream clobetasol  cream (TEMOVATE ) 0.05 % diclofenac Sodium (VOLTAREN) 1 % GEL FARXIGA 10 MG TABS tablet  FARXIGA 5 MG TABS tablet lisinopril (ZESTRIL) 5 MG tablet Multiple Vitamins-Minerals (MULTIVITAMIN ADULT PO) nystatin cream (MYCOSTATIN) QVAR REDIHALER 40 MCG/ACT inhaler Secukinumab , 300 MG Dose, (COSENTYX  SENSOREADY, 300 MG,) 150 MG/ML SOAJ triamcinolone  cream (KENALOG ) 0.1 %   Academic/Vocational History: Highest level of educational attainment: Graduated with a B.S. GPA 2.9  History of developmental delay:None. History of grade repetition:None. Enrollment in special education courses:None. History of LD/ADHD:None.  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  Her longest duration of employment was 32 years.  Psychosocial: Marital Status: Married for 39 years. Children/Grandchildren: 2 children Living Situation: Lives with spouse  Daily Activities/Hobbies: Exercise at [facility]  Mental Status/Behavioral Observations: The patient was seen on an outpatient basis in the [facility] PM&R office for the clinical interview unaccompanied. Sensorium/Arousal: Alert.  Hearing and vision appeared grossly intact. Orientation: Full Appearance: Appropriate dress and hygiene. Behavior: Engaged, attentive, thoughtful. Speech/Language: Conversational speech was prosodic, fluent, and well-articulated.  No difficulties with receptive language were noted. Motor: Within normal limits.  Ambulated independently without issue. Social Comportment: Within normal limits. Mood: Good but sometimes sad depending on the topic being discussed. Affect: Congruent with mood Thought Process/Content: Coherent,  linear, goal directed. Ability to Participate in Interview: Readily answered all questions posed with adequate detail from personal history. Insight: Good.   SUMMARY / CLINICAL IMPRESSIONS The patient was referred for neuropsychological evaluation by by her primary care provider due to concerns for memory loss. Initial differential included MCI versus anxiety.  Notes from referring provider indicated that the patient reported some improvement in short-term memory difficulties during the August 2024 visit relative to those reported in February of that year.  Stress levels are reportedly improved between those two time points as well. Upon interview, the patient acknowledged that her concerns regarding memory loss were slightly less than previously.  She noted some connection between significant stressors/life events and her symptoms.  That being said, as a caregiver of a parent with dementia the patient indicated she wanted to undergo evaluation so she could know if there were any early signs in her self, and also to determine what kind of things she might do to improve memory.  The patient describes cognitive symptoms that of aligned closely with psychosocial stressors.  There is some variability in her symptoms that seemed to align with environmental demands.  She describes some difficulties with short-term memory, processing speed, and word-finding.  She is functioning very well in day-to-day life and that she is managing her own responsibilities but also many responsibilities related to her mother's care and finances (is POA).  The patient denies any significant psychiatric symptoms at present.  The patient has a family history of dementia in late life although dementia type is uncertain.  Formal neuropsychological testing will be beneficial to delineate the patient's cognitive profile and determine if there is any indications of cognitive decline.  DISPOSITION / PLAN The patient has been set up for a  formal neuropsychological assessment to objectively assess her cognitive functioning across domains to establish the patient's cognitive profile. This data, in conjunction with  information obtained via clinical interview and medical record review, will help clarify likely etiology and guide treatment recommendations. Once data collection and interpretation have been completed, the findings / diagnosis and recommendations will be reviewed and discussed with the patient during a feedback appointment with the neuropsychologist. Based on the collaborative dialogue with the patient during the feedback, recommendations may be adjusted / tailored as needed. A formal report will be produced and provided to the patient and the referring provider.   Diagnosis: Memory loss    This report was generated using voice recognition software. While this document has been carefully reviewed, transcription errors may be present. I apologize in advance for any inconvenience. Please contact me if further clarification is needed.            Loletta Ripple, PsyD             Neuropsychologist

## 2024-05-15 NOTE — Progress Notes (Unsigned)
 Office Visit Note  Patient: Taylor Kirby             Date of Birth: Jun 24, 1956           MRN: [id number]             PCP: Teresa Channel, MD Referring: Teresa Channel, MD Visit Date: 05/29/2024 Occupation: @GUAROCC @  Subjective:    History of Present Illness: Taylor Kirby is a 68 y.o. female with history of psoriatic arthritis.  Patient remains on  Cosentyx  300 mg sq injections every 28 days.    CBC and CMP updated on 03/23/24.  TB gold negative on 03/23/24.  Discussed the importance of holding cosentyx  if she develops signs or symptoms of an infection and to resume once the infection has completely cleared    Activities of Daily Living:  Patient reports morning stiffness for *** {minute/hour:19697}.   Patient {ACTIONS;DENIES/REPORTS:21021675::Denies} nocturnal pain.  Difficulty dressing/grooming: {ACTIONS;DENIES/REPORTS:21021675::Denies} Difficulty climbing stairs: {ACTIONS;DENIES/REPORTS:21021675::Denies} Difficulty getting out of chair: {ACTIONS;DENIES/REPORTS:21021675::Denies} Difficulty using hands for taps, buttons, cutlery, and/or writing: {ACTIONS;DENIES/REPORTS:21021675::Denies}  No Rheumatology ROS completed.   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   High risk medication use 10/03/2016   Psoriatic arthritis (HCC) 10/25/2015    Past Medical History:  Diagnosis Date   Allergy    Anemia    Chronic kidney disease    Heart murmur    Osteoporosis    Rheumatoid arthritis (HCC)     Family History  Problem Relation Age of Onset   Hyperlipidemia Mother    Hypertension Mother    Stroke Mother    Hyperlipidemia Father    Cancer Father    Heart attack Father    Hypertension Sister    Heart disease Maternal Grandmother    Cancer Maternal Grandfather    Hypertension  Paternal Grandmother    Past Surgical History:  Procedure Laterality Date   TUBAL LIGATION     Social History   Social History Narrative   Not on file   Immunization History  Administered Date(s) Administered   Moderna Covid-19 Fall Seasonal Vaccine 70yrs & older 11/11/2023   Moderna SARS-COV2 Booster Vaccination 11/19/2020   Moderna Sars-Covid-2 Vaccination 01/27/2020, 02/24/2020     Objective: Vital Signs: There were no vitals taken for this visit.   Physical Exam Vitals and nursing note reviewed.  Constitutional:      Appearance: She is well-developed.  HENT:     Head: Normocephalic and atraumatic.   Eyes:     Conjunctiva/sclera: Conjunctivae normal.    Cardiovascular:     Rate and Rhythm: Normal rate and regular rhythm.     Heart sounds: Normal heart sounds.  Pulmonary:     Effort: Pulmonary effort is normal.     Breath sounds: Normal breath sounds.  Abdominal:     General: Bowel sounds are normal.     Palpations: Abdomen is soft.   Musculoskeletal:     Cervical back: Normal range of motion.  Lymphadenopathy:     Cervical: No cervical adenopathy.   Skin:    General: Skin is warm and dry.     Capillary Refill: Capillary refill takes less than 2 seconds.   Neurological:     Mental Status: She is alert and oriented to person, place, and time.   Psychiatric:  Behavior: Behavior normal.      Musculoskeletal Exam: ***  CDAI Exam: CDAI Score: -- Patient Global: --; Provider Global: -- Swollen: --; Tender: -- Joint Exam 05/29/2024   No joint exam has been documented for this visit   There is currently no information documented on the homunculus. Go to the Rheumatology activity and complete the homunculus joint exam.  Investigation: No additional findings.  Imaging: No results found.  Recent Labs: Lab Results  Component Value Date   WBC 4.0 03/23/2024   HGB 13.1 03/23/2024   PLT 257 03/23/2024   NA 141 03/23/2024   K 4.8 03/23/2024    CL 104 03/23/2024   CO2 29 03/23/2024   GLUCOSE 90 03/23/2024   BUN 20 03/23/2024   CREATININE 0.79 03/23/2024   BILITOT 0.4 03/23/2024   ALKPHOS 95 02/04/2018   AST 19 03/23/2024   ALT 15 03/23/2024   PROT 7.3 03/23/2024   ALBUMIN 3.7 02/04/2018   CALCIUM 9.5 03/23/2024   GFRAA 85 04/10/2021   QFTBGOLDPLUS NEGATIVE 03/23/2024    Speciality Comments: Discontinued methotrexate  September 2021 due to low WBC Fosamax  March 2019 till March 2023 with few gaps.  Procedures:  No procedures performed Allergies: Erythromycin, Penicillins, and Sulfa antibiotics   Assessment / Plan:     Visit Diagnoses: Psoriatic arthritis (HCC)  Other psoriasis  High risk medication use  Contracture of elbow joint, right  Age-related osteoporosis without current pathological fracture  Vitamin D  deficiency  Renal calcinosis  Vitamin B12 deficiency  Essential hypertension  History of anemia  Orders: No orders of the defined types were placed in this encounter.  No orders of the defined types were placed in this encounter.   Face-to-face time spent with patient was *** minutes. Greater than 50% of time was spent in counseling and coordination of care.  Follow-Up Instructions: No follow-ups on file.   Waddell CHRISTELLA Craze, PA-C  Note - This record has been created using Dragon software.  Chart creation errors have been sought, but may not always  have been located. Such creation errors do not reflect on  the standard of medical care.

## 2024-05-16 ENCOUNTER — Encounter

## 2024-05-16 DIAGNOSIS — R413 Other amnesia: Secondary | ICD-10-CM

## 2024-05-16 NOTE — Progress Notes (Signed)
 Behavioral Observations:  The patient's hearing and vision were adequate for testing. She ambulated independently and without issue. No hand tremor was noted during testing. Her speech was prosodic, fluent, and well-articulated. She displayed no clear indications of word-finding difficulties in conversational speech and no notable paraphasic errors were noted. Receptive language appeared intact. The patient's affect was congruent with mood and her mood was largely neutral to positive. The patient was alert and participated in testing as instructed. She was cooperative throughout the session. Her pace was steady. She demonstrated some mild difficulties with frustration tolerance which were most notable on memory recall tasks. Other social interactions were unremarkable and consistent with the setting. No frank attentional lapses were appreciated.  Neuropsychology Note  SHERLINE EBERWEIN completed 110 minutes of neuropsychological testing with technician, Rhett Cella, BA, under the supervision of Aleene Hurry, PsyD., Clinical Neuropsychologist. The patient did not appear overtly distressed by the testing session, per behavioral observation or via self-report to the technician. Rest breaks were offered.   Clinical Decision Making: In considering the patient's current level of functioning, level of presumed impairment, nature of symptoms, emotional and behavioral responses during clinical interview, level of literacy, and observed level of motivation/effort, a battery of tests was selected by Dr. Georgeanne King during initial consultation on 05/10/2024. This was communicated to the technician. Communication between the neuropsychologist and technician was ongoing throughout the testing session and changes were made as deemed necessary based on patient performance on testing, technician observations and additional pertinent factors such as those listed above.  Tests Administered: Automatic Data Edition  (BNT-2) Brief Visuospatial Memory Test-Revised (BVMT-R) Benton Judgment of Line Orientation (JOLO) [place]  Verbal Learning Test-Third Edition (CVLT-3) Clock Drawing Test Controlled Oral Word Association Test (FAS & Animals) Delis-Kaplan Executive Function System (D-KEFS), select subtests Rey Complex Figure Test (RCFT), select subtests Trail Making Test (TMT; Part A & B) Wechsler Adult Intelligence Scale-Fourth Edition (WAIS-IV), select subtests Wechsler Memory Scale-Fourth Edition (WMS-IV) , select subtests Wechsler Memory Scale-Third Edition (WMS-III), select subtests  Wechsler Test of Adult Reading (WTAR) Geriatric Depression Scale-Short Form (GDS-SF) Geriatric Anxiety Inventory (GAI)  Results: Note: This summary of test scores accompanies the interpretive report and should not be interpreted by unqualified individuals or in isolation without reference to the report. Test scores are relative to age, gender, and educational history as available and appropriate.   Measurement properties of test scores: IQ, Index, and Standard Scores (SS): Mean = 100; Standard Deviation = 15; Scaled Scores (ss): Mean = 10; Standard Deviation = 3; Z scores (Z): Mean = 0; Standard Deviation = 1; T scores (T); Mean = 50; Standard Deviation = 10  Intellectual/Premorbid Functioning Estimate   Norm Score Percentile  Range  Wechsler Test of Adult Reading  SS = 108 70 %ile Average   ATTENTION AND WORKING MEMORY    Norm Score Percentile  Range  WAIS-IV          Digit Span  ss = 10 50 %ile Average   DSF  ss = 11 63 %ile Average   Span:    7      DSB  ss = 9 37 %ile Average   Span:    4      DSS  ss = 9 37 %ile Average   Span:    5     WMS-III          Spatial Span  ss = 12 75 %ile High Average   SSF  ss = 13  84 %ile High Average   Span:    6      SSB  ss = 10 50 %ile Average   Span:    4      PROCESSING SPEED    Norm Score Percentile  Range  WAIS-IV          Coding  ss = 10 50 %ile Average    LANGUAGE    Norm Score Percentile  Range  [place] Naming Test (BNT-2)  t = 57 75 %ile High Average  COWAT          FAS  t = 48 42 %ile Average   Animals  t = 52 58 %ile Average   EXECUTIVE FUNCTIONING    Norm Score Percentile  Range  DKEFS - Color-Word Interference          Color Naming  ss = 12 75 %ile High Average   Word Reading  ss = 10 50 %ile Average   Inhibition  ss = 12 75 %ile High Average   Errors  ss = 5 5 %ile Below Average   Inhibition Switching  ss = 9 37 %ile Average   Errors  ss = 10 50 %ile Average  Trails A  t = 50 50 %ile Average  Trails B  t = 46 32 %ile Average   MEMORY    Norm Score Percentile  Range  BVMT-R          Trial 1  t = 35.0 7 %ile Below Average   Trial 2  t = 33 5 %ile Below Average   Trial 3  t = 32.0 4 %ile Below Average   Total Recall  t = 31.0 3 %ile Below Average   Learning  t = 46.0 32 %ile Average   Delayed Recall  t = 36.0 8 %ile Below Average   % Retained    100 >16 %ile WNL   Hits     6-10 %ile Low to Below Average   False Alarms     >16 %ile WNL   Recognition Discriminability     6-10 %ile Low to Below Average  CVLT-III          Trial 1  ss = 8.0 25 %ile Average   Trial 2  ss = 5.0 5 %ile Below Average   Trial 3  ss = 6.0 9 %ile Low Average   Trial 4  ss = 4.0 2 %ile Below Average   Trial 5  ss = 6.0 9 %ile Low Average   Trial B  ss = 9.0 37 %ile Average   Short Delay Free Recall  ss = 6.0 9 %ile Low Average   Short Delay Cued Recall  ss = 6.0 9 %ile Low Average   Long Delay Free Recall  ss = 6.0 9 %ile Low Average   Long Delay Cued Recall  ss = 6.0 9 %ile Low Average   Total Hits  ss = 10.0 50 %ile Average   Total False Positives  ss = 6.0 9 %ile Low Average   Recognition Discriminability  ss = 7.0 16 %ile Low Average   Total Intrusions  ss = 6.0 9 %ile Low Average   Trials 1-5 Total Correct  SS = 75 5 %ile Below Average   Total Repetitions  ss = 11.0 63 %ile Average   List B vs. Trial 1  ss = 10.0 50 %ile Average   SD (FR)  vs. Trial 5 Correct  ss = 8.0 25 %ile Average   LD (FR)vs. SD (FR)  ss = 8.0 25 %ile Average  Wechsler Memory Scale, 4th Edition (WMS-4)         Log. Mem. Immediate Recall  ss = 7 16 %ile Low Average   Logical Memory Delayed Recall  ss = 8 25 %ile Average   Logical Recognition    10th-16th %ile Low Average   VISUAL-SPATIAL    Norm Score Percentile  Range  Benton JOLO  ss = 10 50 %ile Average  Rey Complex Figure Copy       >16  %ile WNL  Clock          PERSONALITY AND BEHAVIORAL FUNCTIONING      Score/Interpretation  GDS-SF Raw       5  GDS-SF Severity       Mild.  GAI Raw       15  GAI Severity       Elevated    Feedback to Patient: TORII ROYSE will return on 06/06/2024 for an interactive feedback session with Dr. Georgeanne King at which time her test performances, clinical impressions and treatment recommendations will be reviewed in detail. The patient understands she can contact our office should she require our assistance before this time.  110 minutes spent face-to-face with patient administering standardized tests, 30 minutes spent scoring Radiographer, therapeutic). [CPT [id number], [id number]]  Full report to follow.

## 2024-05-17 ENCOUNTER — Other Ambulatory Visit: Payer: Self-pay | Admitting: Physician Assistant

## 2024-05-17 ENCOUNTER — Other Ambulatory Visit: Payer: Self-pay

## 2024-05-17 DIAGNOSIS — L408 Other psoriasis: Secondary | ICD-10-CM

## 2024-05-17 DIAGNOSIS — Z79899 Other long term (current) drug therapy: Secondary | ICD-10-CM

## 2024-05-17 DIAGNOSIS — L4050 Arthropathic psoriasis, unspecified: Secondary | ICD-10-CM

## 2024-05-17 NOTE — Telephone Encounter (Signed)
 Last Fill: 02/29/2024  Labs: 03/23/2024 RBC count remains slightly elevated but has improved.   MCH and MCH are low but improving. Rest of CBC WNL.  CMP WNL.   TB Gold: 03/23/2024 TB Gold Negative   Next Visit: 05/29/2024  Last Visit: 12/30/2023  DX: Psoriatic arthritis   Current Dose per office note 12/30/2023: Cosentyx  300 mg sq injections every 28 days   Okay to refill Cosentyx ?

## 2024-05-18 ENCOUNTER — Other Ambulatory Visit: Payer: Self-pay

## 2024-05-18 ENCOUNTER — Other Ambulatory Visit (HOSPITAL_COMMUNITY): Payer: Self-pay

## 2024-05-18 ENCOUNTER — Encounter (INDEPENDENT_AMBULATORY_CARE_PROVIDER_SITE_OTHER): Payer: Self-pay

## 2024-05-18 MED ORDER — COSENTYX SENSOREADY (300 MG) 150 MG/ML ~~LOC~~ SOAJ
300.0000 mg | SUBCUTANEOUS | 2 refills | Status: DC
  Filled 2024-05-18 (×2): qty 2, 28d supply, fill #0
  Filled 2024-06-19: qty 2, 28d supply, fill #1
  Filled 2024-07-12 (×2): qty 2, 28d supply, fill #2

## 2024-05-18 NOTE — Progress Notes (Signed)
 Specialty Pharmacy Refill Coordination Note  Taylor Kirby is a 68 y.o. female contacted today regarding refills of specialty medication(s) Secukinumab  (Cosentyx  Sensoready (300 MG))   Patient requested Delivery   Delivery date: 05/25/24   Verified address: [address]  [place], [place]  [address]   Medication will be filled on 05/24/24.

## 2024-05-29 ENCOUNTER — Telehealth: Payer: Self-pay

## 2024-05-29 ENCOUNTER — Encounter: Payer: Self-pay | Admitting: Physician Assistant

## 2024-05-29 ENCOUNTER — Ambulatory Visit: Payer: Medicare PPO | Attending: Physician Assistant | Admitting: Physician Assistant

## 2024-05-29 VITALS — BP 101/64 | HR 81 | Resp 16 | Ht 64.0 in | Wt 111.0 lb

## 2024-05-29 DIAGNOSIS — Z862 Personal history of diseases of the blood and blood-forming organs and certain disorders involving the immune mechanism: Secondary | ICD-10-CM | POA: Diagnosis not present

## 2024-05-29 DIAGNOSIS — N29 Other disorders of kidney and ureter in diseases classified elsewhere: Secondary | ICD-10-CM

## 2024-05-29 DIAGNOSIS — L4050 Arthropathic psoriasis, unspecified: Secondary | ICD-10-CM | POA: Diagnosis not present

## 2024-05-29 DIAGNOSIS — M24521 Contracture, right elbow: Secondary | ICD-10-CM | POA: Diagnosis not present

## 2024-05-29 DIAGNOSIS — N182 Chronic kidney disease, stage 2 (mild): Secondary | ICD-10-CM | POA: Diagnosis not present

## 2024-05-29 DIAGNOSIS — L408 Other psoriasis: Secondary | ICD-10-CM | POA: Diagnosis not present

## 2024-05-29 DIAGNOSIS — E559 Vitamin D deficiency, unspecified: Secondary | ICD-10-CM

## 2024-05-29 DIAGNOSIS — E538 Deficiency of other specified B group vitamins: Secondary | ICD-10-CM | POA: Diagnosis not present

## 2024-05-29 DIAGNOSIS — I129 Hypertensive chronic kidney disease with stage 1 through stage 4 chronic kidney disease, or unspecified chronic kidney disease: Secondary | ICD-10-CM | POA: Diagnosis not present

## 2024-05-29 DIAGNOSIS — Z79899 Other long term (current) drug therapy: Secondary | ICD-10-CM

## 2024-05-29 DIAGNOSIS — M810 Age-related osteoporosis without current pathological fracture: Secondary | ICD-10-CM

## 2024-05-29 DIAGNOSIS — F4321 Adjustment disorder with depressed mood: Secondary | ICD-10-CM | POA: Diagnosis not present

## 2024-05-29 DIAGNOSIS — I10 Essential (primary) hypertension: Secondary | ICD-10-CM

## 2024-05-29 NOTE — Patient Instructions (Signed)
 Standing Labs We placed an order today for your standing lab work.   Please have your standing labs drawn in early August and every 3 months   Please have your labs drawn 2 weeks prior to your appointment so that the provider can discuss your lab results at your appointment, if possible.  Please note that you may see your imaging and lab results in MyChart before we have reviewed them. We will contact you once all results are reviewed. Please allow our office up to 72 hours to thoroughly review all of the results before contacting the office for clarification of your results.  WALK-IN LAB HOURS  Monday through Thursday from 8:00 am -12:30 pm and 1:00 pm-4:30 pm and Friday from 8:00 am-12:00 pm.  Patients with office visits requiring labs will be seen before walk-in labs.  You may encounter longer than normal wait times. Please allow additional time. Wait times may be shorter on  Monday and Thursday afternoons.  We do not book appointments for walk-in labs. We appreciate your patience and understanding with our staff.   Labs are drawn by [facility]. Please bring your co-pay at the time of your lab draw.  You may receive a bill from [facility] for your lab work.  Please note if you are on Hydroxychloroquine and and an order has been placed for a Hydroxychloroquine level,  you will need to have it drawn 4 hours or more after your last dose.  If you wish to have your labs drawn at another location, please call the office 24 hours in advance so we can fax the orders.  The office is located at [address], [place], [place] [address]   If you have any questions regarding directions or hours of operation,  please call [phone].   As a reminder, please drink plenty of water prior to coming for your lab work. Thanks!

## 2024-05-29 NOTE — Telephone Encounter (Signed)
 Contacted patient to let her know a order was placed for a DEXA, patient stated she recently had one and is going to call to check an see because she believes she had one already. Provided with fax number to have results faxed.

## 2024-05-30 ENCOUNTER — Encounter: Attending: Psychology | Admitting: Psychology

## 2024-05-30 DIAGNOSIS — R413 Other amnesia: Secondary | ICD-10-CM | POA: Diagnosis not present

## 2024-05-30 DIAGNOSIS — F419 Anxiety disorder, unspecified: Secondary | ICD-10-CM | POA: Diagnosis not present

## 2024-05-30 DIAGNOSIS — R4189 Other symptoms and signs involving cognitive functions and awareness: Secondary | ICD-10-CM | POA: Insufficient documentation

## 2024-05-31 ENCOUNTER — Telehealth: Payer: Self-pay

## 2024-05-31 NOTE — Telephone Encounter (Signed)
 Received DEXA results from [facility] .  Date of Scan: 02/02/2024  Lowest T-score:-2.3  BMD:0.799  Lowest site measured:Lumbar Spine  DX: Osteopenia  Significant changes in BMD and site measured (5% and above): N/A  Current Regimen:Vitamin D   Recommendation:Calcium, Vitamin D  and resistive exercises, Recommend updating in 2 years  Reviewed by:Waddell Craze   Next Appointment: 11/07/2024   [facility] for patient to contact the office for recommendations

## 2024-06-01 ENCOUNTER — Encounter: Payer: Self-pay | Admitting: *Deleted

## 2024-06-01 NOTE — Telephone Encounter (Signed)
 Patient advised of DEXA results and recommendations, Calcium, Vitamin D  and resistive exercises, Recommend updating in 2 years

## 2024-06-06 ENCOUNTER — Encounter: Admitting: Psychology

## 2024-06-07 DIAGNOSIS — I129 Hypertensive chronic kidney disease with stage 1 through stage 4 chronic kidney disease, or unspecified chronic kidney disease: Secondary | ICD-10-CM | POA: Diagnosis not present

## 2024-06-07 DIAGNOSIS — N182 Chronic kidney disease, stage 2 (mild): Secondary | ICD-10-CM | POA: Diagnosis not present

## 2024-06-07 DIAGNOSIS — R7303 Prediabetes: Secondary | ICD-10-CM | POA: Diagnosis not present

## 2024-06-07 DIAGNOSIS — E559 Vitamin D deficiency, unspecified: Secondary | ICD-10-CM | POA: Diagnosis not present

## 2024-06-12 ENCOUNTER — Encounter: Payer: Self-pay | Admitting: Physician Assistant

## 2024-06-13 ENCOUNTER — Encounter: Admitting: Psychology

## 2024-06-13 DIAGNOSIS — R4189 Other symptoms and signs involving cognitive functions and awareness: Secondary | ICD-10-CM

## 2024-06-13 DIAGNOSIS — R413 Other amnesia: Secondary | ICD-10-CM | POA: Diagnosis not present

## 2024-06-13 DIAGNOSIS — F419 Anxiety disorder, unspecified: Secondary | ICD-10-CM

## 2024-06-13 NOTE — Progress Notes (Signed)
 NEUROPSYCHOLOGICAL EVALUATION [place]  Physical Medicine and Rehabilitation     Patient: Taylor Kirby  MRN: [id number] DOB: 1956-07-04  Age: 68 y.o. Sex: female  Race/Ethnicity: Black or African American  Years of Education: 16  Referring Provider: Teresa Channel, MD  Provider/Clinical Neuropsychologist: Evalene DOROTHA Riff, PsyD  Date of Service: 05/30/24 Start Time: 10 AM End Time: 11 AM  Location of Service:  [facility] Physical Medicine & Rehabilitation Department [place]. [facility] [address], [place] [place], [place] [address] Phone: [phone]  Billing Code/Service: [phone]  Individuals Present: Evalene Riff, PsyD 1 hour was spent on interpretation of patient data, interpretation of standardized test results and clinical data, clinical decision making, initial treatment planning/recommendations, and report writing. The report will be amended as needed based on any additional information collected during interactive feedback session.   REASON FOR REFERRAL:The patient was referred for neuropsychological evaluation by by her primary care provider due to concerns for memory loss. Initial differential included MCI versus anxiety.  Notes from referring provider indicated that the patient reported some improvement in short-term memory difficulties during the August 2024 visit relative to those reported in February of that year.  Stress levels are reportedly improved between those two time points as well.  Upon interview, the patient acknowledged that her concerns regarding memory loss were slightly less than previously.  She noted some connection between significant stressors/life events and her symptoms.  That being said, as a caregiver of a parent with dementia the patient indicated she wanted to undergo evaluation so she could know if there were any early signs in her self, and also to determine what kind of things she might do to  improve memory.  HISTORY OF PRESENTING CONCERNS:  Cognitive Symptom Onset & Course: The patient reported first noticing cognitive changes in 2021 within the context of the death of her father.  She indicated there was some improvement over time but then declines in the last few years.  She described significant stress related to her caregiving responsibilities that increase over this timeframe. Symptom course has been somewhat variable, increasing when responsibilities/life demands increase.  Current Cognitive Complaints:  Memory: She endorsed frequent difficulties remembering recent events and occasional difficulties remembering conversations.  She reported only occasional difficulties misplacing things.  She denied being told that she repeats herself unintentionally and has no difficulties with managing medical appointments or medications.  Processing Speed: She reported subjective declines in processing speed that has been present for the past 2 years and which is variable with respect to stability. Attention & Concentration: She denied problems with attention/concentration. Language: She endorsed some word-finding difficulties but denied other problems with expressive language.  She denied any problems with receptive speech.  She hide any difficulties with basic mental arithmetic. Visual-Spatial: She denied any problems with getting lost in familiar places or judging space while driving. Executive Functioning: The patient endorsed some tendencies towards either indecision or hesitancy to assert herself.  She is intact with respect to basic decision making and problem solving capabilities and an absolute sense.  She denied problems with organization and this appears to be an area of strength for her.  She denied any significant changes in her personality or behavior.  Motor/Sensory Complaints:   Sensory changes: Denied any changes in sense of smell or taste.  Hearing is reportedly good.  No  significant difficulties with vision. Balance/coordination difficulties: None. Frequent instances of dizziness/vertigo:None. Other motor difficulties: None.  Emotional and  Behavioral Functioning: The patient engaged in individual counseling for about 3 months.  She indicated she stopped about 8 months ago as her therapist indicated that she had met the treatment target goals.  Her therapist indicated that she could reach out to connect again if needed.  The patient described benefits from the therapy and work involving boundaries and delegating work to others, and working to better balance things to facilitate greater self-care.  The patient indicated that her therapist felt that she did have some depressive symptoms. Depression: No current indications of depression.  Denied frequent feelings of low mood, sleep difficulties, low energy, anhedonia, or suicidal ideation. Anxiety: Denied any generalized worry or broad difficulties with attention and relaxation.  Endorsed occasional anxiety related responses which are congruent to the situation. Other: Denied any current or past suicidal ideation, homicidal ideation, hallucinations, paranoia, symptoms of mania.  Has no previous psychiatric treatment history beyond that noted above.  Significant life events involve the loss of her father in 2021, who she continues to grieve.  The patient's mother is reported to have dementia and the patient provides support in coordinating services/finances and is even involved in property management of properties owned by her mother.  The patient described engaging in minimal hobbies or activities for herself and recognizes that she is fairly isolated, socially, with respect to connections outside her family.  Sleep: Denied difficulties with sleep onset or returning to sleep upon waking.  Denied any RBD's.  Typically sleeps about 8 to 10 hours per night. Appetite: Improving. Caffeine: Minimal. Alcohol Use: None. Tobacco  Use: None. Recreational Substance Use: None.  Level of Functional Independence: The patient is fully independent with both basic and instrumental activities of daily living.  She is responsible for many daily activities involved in the care of her mother in addition to those of her own life.  She is able to execute these responsibilities effectively.  Medical History/Record Review: The patient denied any history of stroke, heart attack, TBI, chemotherapy or radiation treatment, high blood pressure and high cholesterol, seizures, or chronic pain.  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  Family Neurologic/Medical Hx: Dementia in mother, alive, 22 years old.  Family History  Problem Relation Age of Onset   Hyperlipidemia Mother    Hypertension Mother    Stroke Mother    Dementia Mother    Hyperlipidemia Father    Cancer Father    Heart attack Father    Hypertension Sister    Heart disease Maternal Grandmother    Cancer Maternal Grandfather    Hypertension Paternal Grandmother    Medications:  albuterol  (PROVENTIL ) (2.5 MG/3ML) 0.083% nebulizer solution betamethasone valerate (VALISONE) 0.1 % cream clobetasol  cream (TEMOVATE ) 0.05 % diclofenac Sodium (VOLTAREN) 1 % GEL FARXIGA 10 MG TABS tablet  FARXIGA 5 MG TABS tablet lisinopril (ZESTRIL) 5 MG tablet Multiple Vitamins-Minerals (MULTIVITAMIN ADULT PO) nystatin cream (MYCOSTATIN) QVAR REDIHALER 40  MCG/ACT  inhaler Secukinumab , 300 MG Dose, (COSENTYX  SENSOREADY, 300 MG,) 150 MG/ML SOAJ triamcinolone  cream (KENALOG ) 0.1 %   Academic/Vocational History: Highest level of educational attainment: Graduated with a B.S. GPA 2.9  History of developmental delay:None. History of grade repetition:None. Enrollment in special education courses:None. History of LD/ADHD:None.  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  Her longest duration of employment was 32 years.  Psychosocial: Marital Status: Married for 39 years. Children/Grandchildren: 2 children Living Situation: Lives with spouse  Daily Activities/Hobbies: Exercise at [facility] FINDINGS:  Behavioral Observations: The patient's hearing and vision were adequate for testing. She ambulated independently and without issue. No hand tremor was noted during testing. Her speech was prosodic, fluent, and well-articulated. She displayed no clear indications of word-finding difficulties in conversational speech and no notable paraphasic errors were noted. Receptive language appeared intact. The patient's affect was congruent with mood and her mood was largely neutral to positive. The patient was alert and participated in testing as instructed. She was cooperative throughout the session. Her pace was steady. She demonstrated some mild difficulties with frustration tolerance which were most notable on memory recall tasks. Other social interactions were unremarkable and consistent with the setting. No frank attentional lapses were appreciated.  Tests Administered: Automatic Data Edition (BNT-2) Brief Visuospatial Memory Test-Revised (BVMT-R) Benton Judgment of Line Orientation (JOLO) [place]  Verbal Learning Test-Third Edition (CVLT-3) Clock Drawing Test Controlled Oral Word Association Test (FAS & Animals) Delis-Kaplan Executive Function System (D-KEFS), select subtests Rey Complex Figure  Test (RCFT), select subtests Trail Making Test (TMT; Part A & B) Wechsler Adult Intelligence Scale-Fourth Edition (WAIS-IV), select subtests Wechsler Memory Scale-Fourth Edition (WMS-IV) , select subtests Wechsler Memory Scale-Third Edition (WMS-III), select subtests  Wechsler Test of Adult Reading (WTAR) Geriatric Depression Scale-Short Form (GDS-SF) Geriatric Anxiety Inventory (GAI)  Results: Intellectual/Premorbid Functioning Estimate   Norm Score Percentile  Range  Wechsler Test of Adult Reading  SS = 108 70 %ile Average   ATTENTION AND WORKING MEMORY    Norm Score Percentile  Range  WAIS-IV          Digit Span  ss = 10 50 %ile Average   DSF  ss = 11 63 %ile Average   Span:    7      DSB  ss = 9 37 %ile Average   Span:    4      DSS  ss = 9 37 %ile Average   Span:    5     WMS-III          Spatial Span  ss = 12 75 %ile High Average   SSF  ss = 13 84 %ile High Average   Span:    6      SSB  ss = 10 50 %ile Average   Span:    4      PROCESSING SPEED    Norm Score Percentile  Range  WAIS-IV          Coding  ss = 10 50 %ile Average   LANGUAGE    Norm Score Percentile  Range  Boston Naming Test (BNT-2)  t = 57 75 %ile High Average  COWAT          FAS  t = 48 42 %ile Average   Animals  t = 52 58 %ile Average   EXECUTIVE FUNCTIONING    Norm Score Percentile  Range  DKEFS - Color-Word Interference  Color Naming  ss = 12 75 %ile High Average   Word Reading  ss = 10 50 %ile Average   Inhibition  ss = 12 75 %ile High Average   Errors  ss = 5 5 %ile Below Average   Inhibition Switching  ss = 9 37 %ile Average   Errors  ss = 10 50 %ile Average  Trails A  t = 50 50 %ile Average  Trails B  t = 46 32 %ile Average   MEMORY    Norm Score Percentile  Range  BVMT-R          Trial 1  t = 35.0 7 %ile Below Average   Trial 2  t = 33 5 %ile Below Average   Trial 3  t = 32.0 4 %ile Below Average   Total Recall  t = 31.0 3 %ile Below Average   Learning  t = 46.0 32 %ile  Average   Delayed Recall  t = 36.0 8 %ile Below Average   % Retained    100 >16 %ile WNL   Hits     6-10 %ile Low to Below Average   False Alarms     >16 %ile WNL   Recognition Discriminability     6-10 %ile Low to Below Average  CVLT-III          Trial 1  ss = 8.0 25 %ile Average   Trial 2  ss = 5.0 5 %ile Below Average   Trial 3  ss = 6.0 9 %ile Low Average   Trial 4  ss = 4.0 2 %ile Below Average   Trial 5  ss = 6.0 9 %ile Low Average   Trial B  ss = 9.0 37 %ile Average   Short Delay Free Recall  ss = 6.0 9 %ile Low Average   Short Delay Cued Recall  ss = 6.0 9 %ile Low Average   Long Delay Free Recall  ss = 6.0 9 %ile Low Average   Long Delay Cued Recall  ss = 6.0 9 %ile Low Average   Total Hits  ss = 10.0 50 %ile Average   Total False Positives  ss = 6.0 9 %ile Low Average   Recognition Discriminability  ss = 7.0 16 %ile Low Average   Total Intrusions  ss = 6.0 9 %ile Low Average   Trials 1-5 Total Correct  SS = 75 5 %ile Below Average   Total Repetitions  ss = 11.0 63 %ile Average   List B vs. Trial 1  ss = 10.0 50 %ile Average   SD (FR) vs. Trial 5 Correct  ss = 8.0 25 %ile Average   LD (FR)vs. SD (FR)  ss = 8.0 25 %ile Average  Wechsler Memory Scale, 4th Edition (WMS-4)         Log. Mem. Immediate Recall  ss = 7 16 %ile Low Average   Logical Memory Delayed Recall  ss = 8 25 %ile Average   Logical Recognition    10th-16th %ile Low Average   VISUAL-SPATIAL    Norm Score Percentile  Range  Benton JOLO  ss = 10 50 %ile Average  Rey Complex Figure Copy       >16  %ile WNL  Clock          PERSONALITY AND BEHAVIORAL FUNCTIONING      Score/Interpretation  GDS-SF Raw       5  GDS-SF Severity  Mild.  GAI Raw       15  GAI Severity       Elevated  Test scores are relative to age, gender, and educational history as available and appropriate.  Measurement properties of test scores: IQ, Index, and Standard Scores (SS): Mean = 100; Standard Deviation = 15; Scaled Scores (ss):  Mean = 10; Standard Deviation = 3; Z scores (Z): Mean = 0; Standard Deviation = 1; T scores (T); Mean = 50; Standard Deviation = 10  SUMMARY / CLINICAL IMPRESSIONS The patient was referred for neuropsychological evaluation by by her primary care provider due to concerns for memory loss. Initial differential included MCI versus anxiety.  Notes from referring provider indicated that the patient reported some improvement in short-term memory difficulties during the August 2024 visit relative to those reported in February of that year.  Stress levels are reportedly improved between those two time points as well. Upon interview, the patient acknowledged that her concerns regarding memory loss were slightly less than previously.  She noted some connection between significant stressors/life events and her symptoms.  That being said, as a caregiver of a parent with dementia the patient indicated she wanted to undergo evaluation so she could know if there were any early signs in her self, and also to determine what kind of things she might do to improve memory.  The patient describes cognitive symptoms that of aligned closely with psychosocial stressors.  There is some variability in her symptoms that seemed to align with environmental demands.  She describes some difficulties with short-term memory, processing speed, and word-finding.  She is functioning very well in day-to-day life and that she is managing her own responsibilities but also many responsibilities related to her mother's care and finances (is POA).  The patient denies any significant psychiatric symptoms at present.  The patient has a family history of dementia in late life although dementia type is uncertain.    Premorbid cognitive abilities are estimated to be within the average range based on a word reading/recognition test and patient history.  She scored in line with expectations on basic auditory-verbal attention, visual attention, and processing  speed.  No consistent or notable differences were seen between basic span of attention and working memory.  On measures of language functioning she scored high average with confrontation naming/word retrieval and she had no difficulty on generative verbal fluency measures (semantic and phonemic).  On measures of executive functioning she showed no clear indications of difficulty with psychomotor sequencing with and without set shifting.  Her speed and accuracy was intact on a combination response inhibition/set shifting task, but she had slightly more difficulty with accuracy on the response inhibition-only trial.  Her speed was significantly faster (statistically) on that trial and scored high average by age, possibly at the cost of accuracy.  Scores on visual spatial tasks showed no difficulty with visual construction, visual planning, basic visual perception.The patient's lowest performance is involved measures of memory.  Overall profile showed below expected scores for learning/encoding new information with each learning trial.  She did show improvement with repetition but total information encoded across those trials was low to below average.  Her delayed free recall performances showed intact (100%) retention of information learned on the visual memory test, average retention over time on the word-list memory test, (CVLT-III) and no clear indications of information loss over time on the story-memory test (WMS-4, Logical Memory). Her scores on delayed recognition tasks were slightly low, but highly consistent with  her initial encoding. She showed only slight improvement in recall / retrieval with cueing in one of the three memory tests The patient's self-report endorsements on depression and anxiety rating scales showed indications of clinically significant symptoms of anxiety and mild depressive symptoms.   The patient's cognitive profile showed below expected memory performances, but this appeared to be  secondary to difficulties with attention and concentration rather than impairments in memory processes. Memory testing showed difficulty with immediate recall/learning of new information. However, her delayed free and cued (recognition) memory performances were highly consistent with the specific information/items she initially encoded (immediate recall / learning trials), indicating intact retention of information over time. This is not consistent with amnestic memory deficits seen in conditions such as Alzheimer's disease. The patient's delayed recall of information did not reliably improve with cueing / on recognition tasks, as is common with dysexecutive memory deficits seen in other neurological conditions (I.e., cerebrovascular disease, FTD, etc.). The patient's memory profile best fits with interference due to problems with attention and concentration. Behavioral observations during testing, endorsements on rating scales, data from clinical interview, and data from follow-up interviewing during the feedback session all strongly point to attention and concentration difficulties (secondary to anxiety and frustration) as the primary driver of memory difficulties. At this point, the most likely cause of the patient's cognitive difficulties involve problems with attention and concentration secondary to anxiety. This also fits with the reported symptom course which appears to correlate with external stressors. Working to improve management of anxiety is therefore recommended as this will likely improve cognitive functioning and also promote overall wellness.    Diagnosis: Unspecified anxiety disorder  Mild cognitive impairment (per history)  Recommendations: Follow up with medical providers as planned. As discussed during feedback, the patient is encouraged to reconnect with her individual therapist for management of anxiety and stress. There are also some indications of depression related symptoms which  may benefit from treatment. Improvements in anxiety and frustration tolerance are likely to provide benefits to the patient's cognitive functioning/memory along with general improvements in wellness.  The patient is encouraged to continue to eat adequately and ensure she is attending to her nutritional needs as this can also impact cognitive functioning. Similarly, ongoing management of any chronic health conditions and maintaining overall health is recommended.  The patient is encouraged to remain active in day to day life. Physical activity and exercise (with physician supervision/approval) can be beneficial in reducing risk of cognitive decline. Similarly, remaining cognitively active and cognitively stimulated can also reduce risk of cognitive decline. Cognitive stimulation does not need to involve formal exercises and essentially any activity which requires some level of concentration and active engagement tends to be cognitively stimulating. Social interaction is particularly cognitively stimulating. Novelty / diversity of activities is also beneficial in terms of cognitive stimulation.  If desired, a re-evaluation can be conducted in the future should there be any concerns for cognitive decline. In the absence of any significant concerns, a re-evaluation is not clearly indicated. It was a pleasure meeting with the patient and her husband. The patient and referring provider are encouraged to reach out with any questions regarding the evaluation or anything in this report.               Evalene DOROTHA Riff, PsyD             Neuropsychologist   This report was generated using voice recognition software. While this document has been carefully reviewed, transcription errors may be present.  I apologize in advance for any inconvenience. Please contact me if further clarification is needed.

## 2024-06-13 NOTE — Progress Notes (Signed)
   NEUROPSYCHOLOGICAL EVALUATION [place]  Physical Medicine and Rehabilitation     Patient: Taylor Kirby  MRN: [id number] DOB: 1956-10-08   Service Provider/Clinical Neuropsychologist: Evalene DOROTHA Riff, PsyD  Date of Service: 06/13/24 Start Time: 1 PM End Time: 2 PM  Location of Service:  [facility] Physical Medicine & Rehabilitation Department [place]. [facility] [address] [place] [place], [place] [address] Phone: [phone]   Billing Code/Service: [phone]    Individuals present: Patient, patient's spouse, Provider Laurier DOROTHA Riff, PsyD)  Provider conducted the 60-minute interactive feedback appointment in-person with the patient, accompanied by the patient's spouse with patient permission.  The provider reviewed and discussed the results of neuropsychological evaluation. Follow-up interviewing was conducted as needed to refine interpretation of findings as needed. Review of results included overall findings, diagnosis, and treatment planning/recommendations that were derived from integration of patient data, interpretation of standardized rest results and clinical data, and clinical decision making, which are documented in the patient's electronic medical record with the full report (date listed below). A copy of the full report will also be mailed to the patient.   The patient expressed understanding of the information reviewed. The patient was provided opportunity to ask questions which were then answered by the provider. The provider worked collaboratively to tailor treatment recommendations to the patient when possible. The patient was informed they could reach out to the provider should additional questions related to the evaluation arise.    The final neuropsychological evaluation report, documented in the patient's chart (Date: 05/30/24), was amended to reflect any additional information obtained during the feedback appointment  including treatment planning collaboration.    This report was generated using voice recognition software. While this document has been carefully reviewed, transcription errors may be present. I apologize in advance for any inconvenience. Please contact me if further clarification is needed.             Evalene DOROTHA Riff, PsyD             Neuropsychologist

## 2024-06-16 ENCOUNTER — Other Ambulatory Visit (HOSPITAL_COMMUNITY): Payer: Self-pay

## 2024-06-17 ENCOUNTER — Encounter (INDEPENDENT_AMBULATORY_CARE_PROVIDER_SITE_OTHER): Payer: Self-pay

## 2024-06-19 ENCOUNTER — Other Ambulatory Visit: Payer: Self-pay

## 2024-06-19 NOTE — Progress Notes (Signed)
 Specialty Pharmacy Refill Coordination Note  Taylor Kirby is a 68 y.o. female contacted today regarding refills of specialty medication(s) Secukinumab  (Cosentyx  Sensoready (300 MG))   Patient requested Delivery   Delivery date: 06/20/24   Verified address: [address]  [place], [place].   [phone]   Medication will be filled on 07.21.25.

## 2024-06-26 DIAGNOSIS — N182 Chronic kidney disease, stage 2 (mild): Secondary | ICD-10-CM | POA: Diagnosis not present

## 2024-06-26 DIAGNOSIS — I129 Hypertensive chronic kidney disease with stage 1 through stage 4 chronic kidney disease, or unspecified chronic kidney disease: Secondary | ICD-10-CM | POA: Diagnosis not present

## 2024-06-29 DIAGNOSIS — F4321 Adjustment disorder with depressed mood: Secondary | ICD-10-CM | POA: Diagnosis not present

## 2024-06-29 DIAGNOSIS — I129 Hypertensive chronic kidney disease with stage 1 through stage 4 chronic kidney disease, or unspecified chronic kidney disease: Secondary | ICD-10-CM | POA: Diagnosis not present

## 2024-06-29 DIAGNOSIS — N182 Chronic kidney disease, stage 2 (mild): Secondary | ICD-10-CM | POA: Diagnosis not present

## 2024-06-29 DIAGNOSIS — L4050 Arthropathic psoriasis, unspecified: Secondary | ICD-10-CM | POA: Diagnosis not present

## 2024-06-29 DIAGNOSIS — M810 Age-related osteoporosis without current pathological fracture: Secondary | ICD-10-CM | POA: Diagnosis not present

## 2024-07-12 ENCOUNTER — Other Ambulatory Visit: Payer: Self-pay

## 2024-07-12 NOTE — Progress Notes (Signed)
 Specialty Pharmacy Refill Coordination Note  [name] is a 68 y.o. female contacted today regarding refills of specialty medication(s) Secukinumab  (Cosentyx  Sensoready (300 MG))   Patient requested Delivery   Delivery date: 07/19/24   Verified address: [address]  [place], [place].   [address]   Medication will be filled on 07/18/24.

## 2024-07-26 ENCOUNTER — Encounter: Payer: Medicare PPO | Admitting: Psychology

## 2024-07-26 DIAGNOSIS — N182 Chronic kidney disease, stage 2 (mild): Secondary | ICD-10-CM | POA: Diagnosis not present

## 2024-07-26 DIAGNOSIS — I129 Hypertensive chronic kidney disease with stage 1 through stage 4 chronic kidney disease, or unspecified chronic kidney disease: Secondary | ICD-10-CM | POA: Diagnosis not present

## 2024-07-30 DIAGNOSIS — I129 Hypertensive chronic kidney disease with stage 1 through stage 4 chronic kidney disease, or unspecified chronic kidney disease: Secondary | ICD-10-CM | POA: Diagnosis not present

## 2024-07-30 DIAGNOSIS — M810 Age-related osteoporosis without current pathological fracture: Secondary | ICD-10-CM | POA: Diagnosis not present

## 2024-07-30 DIAGNOSIS — N182 Chronic kidney disease, stage 2 (mild): Secondary | ICD-10-CM | POA: Diagnosis not present

## 2024-07-30 DIAGNOSIS — F4321 Adjustment disorder with depressed mood: Secondary | ICD-10-CM | POA: Diagnosis not present

## 2024-07-30 DIAGNOSIS — L4050 Arthropathic psoriasis, unspecified: Secondary | ICD-10-CM | POA: Diagnosis not present

## 2024-08-10 ENCOUNTER — Other Ambulatory Visit: Payer: Self-pay

## 2024-08-10 ENCOUNTER — Other Ambulatory Visit: Payer: Self-pay | Admitting: Physician Assistant

## 2024-08-10 DIAGNOSIS — L4050 Arthropathic psoriasis, unspecified: Secondary | ICD-10-CM

## 2024-08-10 DIAGNOSIS — Z79899 Other long term (current) drug therapy: Secondary | ICD-10-CM

## 2024-08-10 DIAGNOSIS — L408 Other psoriasis: Secondary | ICD-10-CM

## 2024-08-10 MED ORDER — COSENTYX SENSOREADY (300 MG) 150 MG/ML ~~LOC~~ SOAJ
300.0000 mg | SUBCUTANEOUS | 0 refills | Status: DC
  Filled 2024-08-10: qty 2, 28d supply, fill #0

## 2024-08-10 NOTE — Progress Notes (Signed)
 Specialty Pharmacy Refill Coordination Note  Taylor Kirby is a 68 y.o. female contacted today regarding refills of specialty medication(s) Secukinumab  (Cosentyx  Sensoready (300 MG))   Patient requested Delivery   Delivery date: 08/15/24   Verified address: [address]  [place], [place].   [phone]   Medication will be filled on 08/14/24. This fill date is pending response to refill request from provider. Patient is aware and if they have not received fill by intended date they must follow up with pharmacy.

## 2024-08-10 NOTE — Telephone Encounter (Signed)
 Last Fill: 05/18/2024  Labs: 03/23/2024 CMP WNL  04/03/2024 CBC ([facility] Tab) WBC 3.2 RBC 5.63 MCH 24.9 MCHC 30.4 Neutrophils 1.3  TB Gold: 03/23/2024  TB gold negative   Next Visit: 11/07/2024  Last Visit: 05/29/2024  DX: Psoriatic arthritis (HCC)   Current Dose per office note 05/29/2024: Cosentyx  300 mg sq injections every 28 days   Attempted to contact the patient and left a message advising labs are due and our office lab hours.   Okay to refill Cosentyx ?

## 2024-08-13 DIAGNOSIS — T148XXA Other injury of unspecified body region, initial encounter: Secondary | ICD-10-CM | POA: Diagnosis not present

## 2024-08-14 ENCOUNTER — Other Ambulatory Visit: Payer: Self-pay

## 2024-08-15 ENCOUNTER — Other Ambulatory Visit: Payer: Self-pay | Admitting: *Deleted

## 2024-08-15 DIAGNOSIS — Z79899 Other long term (current) drug therapy: Secondary | ICD-10-CM

## 2024-08-16 ENCOUNTER — Ambulatory Visit: Payer: Self-pay | Admitting: Physician Assistant

## 2024-08-16 LAB — CBC WITH DIFFERENTIAL/PLATELET
Absolute Lymphocytes: 1592 {cells}/uL (ref 850–3900)
Absolute Monocytes: 315 {cells}/uL (ref 200–950)
Basophils Absolute: 49 {cells}/uL (ref 0–200)
Basophils Relative: 1.3 %
Eosinophils Absolute: 80 {cells}/uL (ref 15–500)
Eosinophils Relative: 2.1 %
HCT: 45.5 % — ABNORMAL HIGH (ref 35.0–45.0)
Hemoglobin: 13.7 g/dL (ref 11.7–15.5)
MCH: 24.1 pg — ABNORMAL LOW (ref 27.0–33.0)
MCHC: 30.1 g/dL — ABNORMAL LOW (ref 32.0–36.0)
MCV: 80 fL (ref 80.0–100.0)
MPV: 11.8 fL (ref 7.5–12.5)
Monocytes Relative: 8.3 %
Neutro Abs: 1763 {cells}/uL (ref 1500–7800)
Neutrophils Relative %: 46.4 %
Platelets: 247 10*3/uL (ref 140–400)
RBC: 5.69 Million/uL — ABNORMAL HIGH (ref 3.80–5.10)
RDW: 13.9 % (ref 11.0–15.0)
Total Lymphocyte: 41.9 %
WBC: 3.8 10*3/uL (ref 3.8–10.8)

## 2024-08-16 LAB — COMPREHENSIVE METABOLIC PANEL WITH GFR
AG Ratio: 1.3 (calc) (ref 1.0–2.5)
ALT: 14 U/L (ref 6–29)
AST: 19 U/L (ref 10–35)
Albumin: 4.1 g/dL (ref 3.6–5.1)
Alkaline phosphatase (APISO): 73 U/L (ref 37–153)
BUN: 21 mg/dL (ref 7–25)
CO2: 30 mmol/L (ref 20–32)
Calcium: 9.5 mg/dL (ref 8.6–10.4)
Chloride: 100 mmol/L (ref 98–110)
Creat: 0.84 mg/dL (ref 0.50–1.05)
Globulin: 3.2 g/dL (ref 1.9–3.7)
Glucose, Bld: 81 mg/dL (ref 65–99)
Potassium: 4 mmol/L (ref 3.5–5.3)
Sodium: 138 mmol/L (ref 135–146)
Total Bilirubin: 0.5 mg/dL (ref 0.2–1.2)
Total Protein: 7.3 g/dL (ref 6.1–8.1)
eGFR: 77 mL/min/{1.73_m2}

## 2024-08-16 NOTE — Progress Notes (Signed)
 CMP WNL RBC count remains borderline elevated.  Hematocrit is borderline elevated. MCH and MCHC are low  stable.  Rest of CBC WNL.  We will continue to monitor.

## 2024-08-21 DIAGNOSIS — R7303 Prediabetes: Secondary | ICD-10-CM | POA: Diagnosis not present

## 2024-08-23 DIAGNOSIS — R7303 Prediabetes: Secondary | ICD-10-CM | POA: Diagnosis not present

## 2024-08-23 DIAGNOSIS — E559 Vitamin D deficiency, unspecified: Secondary | ICD-10-CM | POA: Diagnosis not present

## 2024-08-23 DIAGNOSIS — N182 Chronic kidney disease, stage 2 (mild): Secondary | ICD-10-CM | POA: Diagnosis not present

## 2024-08-23 DIAGNOSIS — I129 Hypertensive chronic kidney disease with stage 1 through stage 4 chronic kidney disease, or unspecified chronic kidney disease: Secondary | ICD-10-CM | POA: Diagnosis not present

## 2024-08-25 DIAGNOSIS — N182 Chronic kidney disease, stage 2 (mild): Secondary | ICD-10-CM | POA: Diagnosis not present

## 2024-08-25 DIAGNOSIS — I129 Hypertensive chronic kidney disease with stage 1 through stage 4 chronic kidney disease, or unspecified chronic kidney disease: Secondary | ICD-10-CM | POA: Diagnosis not present

## 2024-08-29 DIAGNOSIS — F4321 Adjustment disorder with depressed mood: Secondary | ICD-10-CM | POA: Diagnosis not present

## 2024-08-29 DIAGNOSIS — L4050 Arthropathic psoriasis, unspecified: Secondary | ICD-10-CM | POA: Diagnosis not present

## 2024-08-29 DIAGNOSIS — M810 Age-related osteoporosis without current pathological fracture: Secondary | ICD-10-CM | POA: Diagnosis not present

## 2024-08-29 DIAGNOSIS — I129 Hypertensive chronic kidney disease with stage 1 through stage 4 chronic kidney disease, or unspecified chronic kidney disease: Secondary | ICD-10-CM | POA: Diagnosis not present

## 2024-08-29 DIAGNOSIS — N182 Chronic kidney disease, stage 2 (mild): Secondary | ICD-10-CM | POA: Diagnosis not present

## 2024-09-04 ENCOUNTER — Other Ambulatory Visit: Payer: Self-pay

## 2024-09-04 ENCOUNTER — Other Ambulatory Visit: Payer: Self-pay | Admitting: Rheumatology

## 2024-09-04 DIAGNOSIS — L408 Other psoriasis: Secondary | ICD-10-CM

## 2024-09-04 DIAGNOSIS — L4050 Arthropathic psoriasis, unspecified: Secondary | ICD-10-CM

## 2024-09-04 DIAGNOSIS — Z79899 Other long term (current) drug therapy: Secondary | ICD-10-CM

## 2024-09-04 MED ORDER — COSENTYX SENSOREADY (300 MG) 150 MG/ML ~~LOC~~ SOAJ
SUBCUTANEOUS | 0 refills | Status: DC
  Filled 2024-09-04: qty 6, fill #0
  Filled 2024-09-06: qty 2, 28d supply, fill #0
  Filled 2024-10-02: qty 2, 28d supply, fill #1
  Filled 2024-10-30: qty 2, 28d supply, fill #2

## 2024-09-04 NOTE — Telephone Encounter (Signed)
 Last Fill: 08/10/2024  Labs: 08/15/2024 CMP WNL RBC count remains borderline elevated.  Hematocrit is borderline elevated. MCH and MCHC are low  stable.  Rest of CBC WNL.  We will continue to monitor.   TB Gold: 03/23/2024 negative    Next Visit: 11/07/2024  Last Visit: 05/29/2024  DX: Psoriatic arthritis   Current Dose per office note on 05/29/2024: Cosentyx  300 mg sq injections every 28 days.   Okay to refill Cosentyx ?

## 2024-09-05 ENCOUNTER — Other Ambulatory Visit: Payer: Self-pay

## 2024-09-06 ENCOUNTER — Other Ambulatory Visit: Payer: Self-pay

## 2024-09-06 NOTE — Progress Notes (Signed)
 Specialty Pharmacy Refill Coordination Note  Taylor Kirby is a 68 y.o. female contacted today regarding refills of specialty medication(s) Secukinumab  (Cosentyx  Sensoready (300 MG))   Patient requested Delivery   Delivery date: 09/12/24   Verified address: [address]  [place], [place].   [phone]   Medication will be filled on 09/11/24.

## 2024-09-06 NOTE — Progress Notes (Signed)
 Specialty Pharmacy Ongoing Clinical Assessment Note  Taylor Kirby is a 68 y.o. female who is being followed by the specialty pharmacy service for RxSp Psoriatic Arthritis   Patient's specialty medication(s) reviewed today: Secukinumab  (Cosentyx  Sensoready (300 MG))   Missed doses in the last 4 weeks: 0   Patient/Caregiver did not have any additional questions or concerns.   Therapeutic benefit summary: Patient is achieving benefit   Adverse events/side effects summary: No adverse events/side effects   Patient's therapy is appropriate to: Continue    Goals Addressed             This Visit's Progress    Maintain optimal adherence to therapy   On track    Patient is on track. Patient will maintain adherence, adhere to provider and/or lab appointments, and be monitored by provider to determine if a change in treatment plan is warranted         Follow up: 12 months  [facility] Specialty Pharmacist

## 2024-09-06 NOTE — Progress Notes (Signed)
 Clinical Intervention Note  Clinical Intervention Notes: Patient reported starting a biotin supplement to help with hair and nail growth. No DDIs identified with Cosentyx .   Clinical Intervention Outcomes: Prevention of an adverse drug event   Advertising account planner

## 2024-09-08 ENCOUNTER — Other Ambulatory Visit: Payer: Self-pay

## 2024-09-11 ENCOUNTER — Other Ambulatory Visit: Payer: Self-pay

## 2024-09-20 DIAGNOSIS — N182 Chronic kidney disease, stage 2 (mild): Secondary | ICD-10-CM | POA: Diagnosis not present

## 2024-09-20 DIAGNOSIS — E559 Vitamin D deficiency, unspecified: Secondary | ICD-10-CM | POA: Diagnosis not present

## 2024-09-24 DIAGNOSIS — I129 Hypertensive chronic kidney disease with stage 1 through stage 4 chronic kidney disease, or unspecified chronic kidney disease: Secondary | ICD-10-CM | POA: Diagnosis not present

## 2024-09-24 DIAGNOSIS — N182 Chronic kidney disease, stage 2 (mild): Secondary | ICD-10-CM | POA: Diagnosis not present

## 2024-09-29 DIAGNOSIS — L4050 Arthropathic psoriasis, unspecified: Secondary | ICD-10-CM | POA: Diagnosis not present

## 2024-09-29 DIAGNOSIS — F4321 Adjustment disorder with depressed mood: Secondary | ICD-10-CM | POA: Diagnosis not present

## 2024-09-29 DIAGNOSIS — N182 Chronic kidney disease, stage 2 (mild): Secondary | ICD-10-CM | POA: Diagnosis not present

## 2024-09-29 DIAGNOSIS — I129 Hypertensive chronic kidney disease with stage 1 through stage 4 chronic kidney disease, or unspecified chronic kidney disease: Secondary | ICD-10-CM | POA: Diagnosis not present

## 2024-09-29 DIAGNOSIS — M810 Age-related osteoporosis without current pathological fracture: Secondary | ICD-10-CM | POA: Diagnosis not present

## 2024-10-02 ENCOUNTER — Other Ambulatory Visit (HOSPITAL_COMMUNITY): Payer: Self-pay

## 2024-10-03 ENCOUNTER — Encounter (INDEPENDENT_AMBULATORY_CARE_PROVIDER_SITE_OTHER): Payer: Self-pay

## 2024-10-03 ENCOUNTER — Other Ambulatory Visit: Payer: Self-pay | Admitting: Pharmacy Technician

## 2024-10-03 ENCOUNTER — Other Ambulatory Visit: Payer: Self-pay

## 2024-10-03 NOTE — Progress Notes (Signed)
 Specialty Pharmacy Refill Coordination Note  Taylor Kirby is a 68 y.o. female contacted today regarding refills of specialty medication(s) Secukinumab  (Cosentyx  Sensoready (300 MG))   Patient requested (Patient-Rptd) Delivery   Delivery date: 10/10/2024 Verified address: (Patient-Rptd) [address]  [place], [place]  [address]   Medication will be filled on: 10/09/2024

## 2024-10-04 DIAGNOSIS — I129 Hypertensive chronic kidney disease with stage 1 through stage 4 chronic kidney disease, or unspecified chronic kidney disease: Secondary | ICD-10-CM | POA: Diagnosis not present

## 2024-10-04 DIAGNOSIS — E559 Vitamin D deficiency, unspecified: Secondary | ICD-10-CM | POA: Diagnosis not present

## 2024-10-04 DIAGNOSIS — N182 Chronic kidney disease, stage 2 (mild): Secondary | ICD-10-CM | POA: Diagnosis not present

## 2024-10-04 DIAGNOSIS — R809 Proteinuria, unspecified: Secondary | ICD-10-CM | POA: Diagnosis not present

## 2024-10-05 DIAGNOSIS — I129 Hypertensive chronic kidney disease with stage 1 through stage 4 chronic kidney disease, or unspecified chronic kidney disease: Secondary | ICD-10-CM | POA: Diagnosis not present

## 2024-10-05 DIAGNOSIS — G3184 Mild cognitive impairment, so stated: Secondary | ICD-10-CM | POA: Diagnosis not present

## 2024-10-05 DIAGNOSIS — R7303 Prediabetes: Secondary | ICD-10-CM | POA: Diagnosis not present

## 2024-10-05 DIAGNOSIS — Z23 Encounter for immunization: Secondary | ICD-10-CM | POA: Diagnosis not present

## 2024-10-05 DIAGNOSIS — N182 Chronic kidney disease, stage 2 (mild): Secondary | ICD-10-CM | POA: Diagnosis not present

## 2024-10-09 ENCOUNTER — Other Ambulatory Visit: Payer: Self-pay

## 2024-10-17 ENCOUNTER — Emergency Department (HOSPITAL_COMMUNITY)
Admission: EM | Admit: 2024-10-17 | Discharge: 2024-10-17 | Disposition: A | Discharge status: Home / Self Care | Attending: Emergency Medicine | Admitting: Emergency Medicine

## 2024-10-17 ENCOUNTER — Encounter (HOSPITAL_COMMUNITY): Payer: Self-pay | Admitting: Radiology

## 2024-10-17 ENCOUNTER — Emergency Department (HOSPITAL_COMMUNITY)

## 2024-10-17 DIAGNOSIS — N132 Hydronephrosis with renal and ureteral calculous obstruction: Secondary | ICD-10-CM | POA: Insufficient documentation

## 2024-10-17 DIAGNOSIS — N200 Calculus of kidney: Secondary | ICD-10-CM

## 2024-10-17 DIAGNOSIS — N189 Chronic kidney disease, unspecified: Secondary | ICD-10-CM | POA: Diagnosis not present

## 2024-10-17 DIAGNOSIS — N23 Unspecified renal colic: Secondary | ICD-10-CM | POA: Diagnosis not present

## 2024-10-17 DIAGNOSIS — K5792 Diverticulitis of intestine, part unspecified, without perforation or abscess without bleeding: Secondary | ICD-10-CM | POA: Diagnosis not present

## 2024-10-17 DIAGNOSIS — R001 Bradycardia, unspecified: Secondary | ICD-10-CM | POA: Diagnosis not present

## 2024-10-17 DIAGNOSIS — D259 Leiomyoma of uterus, unspecified: Secondary | ICD-10-CM | POA: Diagnosis not present

## 2024-10-17 DIAGNOSIS — R10A2 Flank pain, left side: Secondary | ICD-10-CM | POA: Diagnosis present

## 2024-10-17 DIAGNOSIS — K7689 Other specified diseases of liver: Secondary | ICD-10-CM | POA: Diagnosis not present

## 2024-10-17 DIAGNOSIS — R42 Dizziness and giddiness: Secondary | ICD-10-CM | POA: Diagnosis not present

## 2024-10-17 DIAGNOSIS — M549 Dorsalgia, unspecified: Secondary | ICD-10-CM | POA: Diagnosis not present

## 2024-10-17 HISTORY — DX: Calculus of kidney: N20.0

## 2024-10-17 LAB — COMPREHENSIVE METABOLIC PANEL WITH GFR
ALT: 16 U/L (ref 0–44)
AST: 27 U/L (ref 15–41)
Albumin: 4.4 g/dL (ref 3.5–5.0)
Alkaline Phosphatase: 88 U/L (ref 38–126)
Anion gap: 10 (ref 5–15)
BUN: 17 mg/dL (ref 8–23)
CO2: 29 mmol/L (ref 22–32)
Calcium: 10.5 mg/dL — ABNORMAL HIGH (ref 8.9–10.3)
Chloride: 103 mmol/L (ref 98–111)
Creatinine, Ser: 0.84 mg/dL (ref 0.44–1.00)
GFR, Estimated: >60 mL/min (ref >60)
Glucose, Bld: 114 mg/dL — ABNORMAL HIGH (ref 70–99)
Potassium: 3.7 mmol/L (ref 3.5–5.1)
Sodium: 141 mmol/L (ref 135–145)
Total Bilirubin: 0.5 mg/dL (ref 0.0–1.2)
Total Protein: 8.4 g/dL — ABNORMAL HIGH (ref 6.5–8.1)

## 2024-10-17 LAB — CBC
HCT: 48.7 % — ABNORMAL HIGH (ref 36.0–46.0)
Hemoglobin: 15 g/dL (ref 12.0–15.0)
MCH: 24.5 pg — ABNORMAL LOW (ref 26.0–34.0)
MCHC: 30.8 g/dL (ref 30.0–36.0)
MCV: 79.6 fL — ABNORMAL LOW (ref 80.0–100.0)
Platelets: 286 K/uL (ref 150–400)
RBC: 6.12 MIL/uL — ABNORMAL HIGH (ref 3.87–5.11)
RDW: 17.3 % — ABNORMAL HIGH (ref 11.5–15.5)
WBC: 5 K/uL (ref 4.0–10.5)
nRBC: 0 % (ref 0.0–0.2)

## 2024-10-17 LAB — URINALYSIS, ROUTINE W REFLEX MICROSCOPIC
Bacteria, UA: NONE SEEN
Bilirubin Urine: NEGATIVE
Glucose, UA: >=500 mg/dL — AB
Ketones, ur: 5 mg/dL — AB
Leukocytes,Ua: NEGATIVE
Nitrite: NEGATIVE
Protein, ur: 100 mg/dL — AB
RBC / HPF: >50 RBC/hpf (ref 0–5)
Specific Gravity, Urine: 1.015 (ref 1.005–1.030)
pH: 6 (ref 5.0–8.0)

## 2024-10-17 LAB — CBG MONITORING, ED: Glucose-Capillary: 91 mg/dL (ref 70–99)

## 2024-10-17 LAB — LIPASE, BLOOD: Lipase: 29 U/L (ref 11–51)

## 2024-10-17 MED ORDER — IBUPROFEN 600 MG PO TABS: 600.0000 mg | ORAL_TABLET | Freq: Three times a day (TID) | ORAL | 0 refills | Status: AC | PRN

## 2024-10-17 MED ORDER — TAMSULOSIN HCL 0.4 MG PO CAPS
0.4000 mg | ORAL_CAPSULE | Freq: Every day | ORAL | 0 refills | Status: AC | PRN
Start: 2024-10-17 — End: 2024-10-31

## 2024-10-17 MED ORDER — OXYCODONE HCL 5 MG PO TABS: 5.0000 mg | ORAL_TABLET | Freq: Four times a day (QID) | ORAL | 0 refills | Status: AC | PRN

## 2024-10-17 MED ORDER — KETOROLAC TROMETHAMINE 30 MG/ML IJ SOLN
30.0000 mg | Freq: Once | INTRAMUSCULAR | Status: AC
  Administered 2024-10-17: 30 mg via INTRAVENOUS
  Filled 2024-10-17: qty 1

## 2024-10-17 MED ORDER — ONDANSETRON 4 MG PO TBDP: 4.0000 mg | ORAL_TABLET | Freq: Three times a day (TID) | ORAL | 0 refills | Status: AC | PRN

## 2024-10-17 NOTE — ED Provider Triage Note (Signed)
 Emergency Medicine Provider Triage Evaluation Note  Taylor Kirby , a 68 y.o. female  was evaluated in triage.  Pt complains of presenting to ED with 1 day of intermittent left upper quadrant and left flank pain and left lower abdominal pain with loose stools.  Patient reports she had subjective chills at home with this, nausea, felt lightheaded.  History of kidney stones in the past.  No dysuria.  Review of Systems  Positive: Flank pain, abdominal pain, lightheadedness, chills Negative: Loss of consciousness  Physical Exam  BP (!) 131/91 (BP Location: Right Arm)   Pulse 74   Temp 98.1 F (36.7 C) (Oral)   Resp 16   SpO2 100%  Gen:   Awake, no distress   Resp:  Normal effort  MSK:   Moves extremities without difficulty    Medical Decision Making  Medically screening exam initiated at 10:45 AM.  Appropriate orders placed.  Taylor Kirby was informed that the remainder of the evaluation will be completed by another provider, this initial triage assessment does not replace that evaluation, and the importance of remaining in the ED until their evaluation is complete.  Urine, labs, CT imaging ordered.  Patient is well-appearing at this time in triage and minimally symptomatic.   Taylor Donnice PARAS, MD 10/17/24 1045

## 2024-10-17 NOTE — ED Provider Notes (Signed)
 [place] EMERGENCY DEPARTMENT AT [facility] Provider Note   CSN: [id number] Arrival date & time: 10/17/24  9044     Patient presents with: Flank Pain   Taylor Kirby is a 68 y.o. female presenting to emergency department with concern for acute onset colicky left-sided flank pain, lower abdominal pain, some loose stools, that began in the past 12 hours.  She reports she was associated with some chills at home, nausea.  She has a history of kidney stones and feels similar    HPI     Prior to Admission medications   Medication Sig Start Date End Date Taking? Authorizing Provider  ibuprofen (ADVIL) 600 MG tablet Take 1 tablet (600 mg total) by mouth every 8 (eight) hours as needed for up to 30 doses for mild pain (pain score 1-3) or moderate pain (pain score 4-6). 10/17/24  Yes Cottie Donnice PARAS, MD  ondansetron (ZOFRAN-ODT) 4 MG disintegrating tablet Take 1 tablet (4 mg total) by mouth every 8 (eight) hours as needed for up to 12 doses for nausea or vomiting. 10/17/24  Yes Hetvi Shawhan, Donnice PARAS, MD  oxyCODONE (ROXICODONE) 5 MG immediate release tablet Take 1 tablet (5 mg total) by mouth every 6 (six) hours as needed for up to 10 doses for severe pain (pain score 7-10). 10/17/24  Yes Cottie Donnice PARAS, MD  tamsulosin (FLOMAX) 0.4 MG CAPS capsule Take 1 capsule (0.4 mg total) by mouth daily as needed for up to 14 days. 10/17/24 10/31/24 Yes Kayden Amend, Donnice PARAS, MD  albuterol  (PROVENTIL ) (2.5 MG/3ML) 0.083% nebulizer solution Take 3 mLs (2.5 mg total) by nebulization every 6 (six) hours as needed for wheezing or shortness of breath. 04/01/14   Corey, Evan S, MD  betamethasone valerate (VALISONE) 0.1 % cream Apply topically 2 (two) times daily. 07/21/23   [provider]  Cholecalciferol 50 MCG (2000 UT) TABS 1 tablet Orally Once a day    [provider]  clobetasol  cream (TEMOVATE ) 0.05 % Apply 1 Application topically 2 (two) times daily. Patient taking differently:  Apply 1 Application topically as needed. 05/28/22   Cheryl Waddell HERO, PA-C  diclofenac Sodium (VOLTAREN) 1 % GEL Apply topically 4 (four) times daily. Patient not taking: Reported on 05/29/2024    [provider]  FARXIGA 10 MG TABS tablet Take 10 mg by mouth daily.    [provider]  FARXIGA 5 MG TABS tablet Take 5 mg by mouth daily. Patient not taking: Reported on 12/30/2023 07/24/21   [provider]  lisinopril (ZESTRIL) 5 MG tablet Take 10 mg by mouth daily. 05/11/20   [provider]  Multiple Vitamins-Minerals (MULTIVITAMIN ADULT PO) Take by mouth daily.    [provider]  nystatin cream (MYCOSTATIN) as needed. 04/12/18   [provider]  QVAR REDIHALER 40 MCG/ACT inhaler Inhale into the lungs as needed. Patient not taking: Reported on 05/29/2024 06/22/21   [provider]  Secukinumab , 300 MG Dose, (COSENTYX  SENSOREADY, 300 MG,) 150 MG/ML SOAJ INJECT TWO PENS SUBCUTANEOUSLY EVERY 4 WEEKS. REFRIGERATE. ALLOW 15 TO 30 MINUTES AT ROOM TEMP PRIOR TO ADMINISTRATION. 09/04/24   Cheryl Waddell HERO, PA-C  triamcinolone  cream (KENALOG ) 0.1 % Apply to aa's psoriasis QD-BID up to 5d/wk. Avoid applying to face, groin, and axilla. Use as directed. Long-term use can cause thinning of the skin. Patient taking differently: as needed. Apply to aa's psoriasis QD-BID up to 5d/wk. Avoid applying to face, groin, and axilla. Use as directed. Long-term use can  cause thinning of the skin. 07/13/22   Hester Alm BROCKS, MD  Zinc Acetate 25 MG CAPS 1 tablet Orally Once a day; Duration: 30 day(s)    [provider]    Allergies: Erythromycin, Penicillins, and Sulfa antibiotics    Review of Systems  Updated Vital Signs BP 135/87 (BP Location: Right Arm)   Pulse 67   Temp 98.1 F (36.7 C) (Oral)   Resp 18   SpO2 100%   Physical Exam Constitutional:      General: She is not in acute distress. HENT:     Head: Normocephalic and atraumatic.  Eyes:      Conjunctiva/sclera: Conjunctivae normal.     Pupils: Pupils are equal, round, and reactive to light.  Cardiovascular:     Rate and Rhythm: Normal rate and regular rhythm.  Pulmonary:     Effort: Pulmonary effort is normal. No respiratory distress.  Abdominal:     General: There is no distension.     Tenderness: There is no abdominal tenderness.  Skin:    General: Skin is warm and dry.  Neurological:     General: No focal deficit present.     Mental Status: She is alert. Mental status is at baseline.  Psychiatric:        Mood and Affect: Mood normal.        Behavior: Behavior normal.     (all labs ordered are listed, but only abnormal results are displayed) Labs Reviewed  COMPREHENSIVE METABOLIC PANEL WITH GFR - Abnormal; Notable for the following components:      Result Value   Glucose, Bld 114 (*)    Calcium 10.5 (*)    Total Protein 8.4 (*)    All other components within normal limits  CBC - Abnormal; Notable for the following components:   RBC 6.12 (*)    HCT 48.7 (*)    MCV 79.6 (*)    MCH 24.5 (*)    RDW 17.3 (*)    All other components within normal limits  URINALYSIS, ROUTINE W REFLEX MICROSCOPIC - Abnormal; Notable for the following components:   Glucose, UA >=500 (*)    Hgb urine dipstick LARGE (*)    Ketones, ur 5 (*)    Protein, ur 100 (*)    All other components within normal limits  LIPASE, BLOOD  CBG MONITORING, ED    EKG: None  Radiology: CT ABDOMEN PELVIS WO CONTRAST Result Date: 10/17/2024 CLINICAL DATA:  Left lower quadrant abdominal pain. Concern for acute diverticulitis. EXAM: CT ABDOMEN AND PELVIS WITHOUT CONTRAST TECHNIQUE: Multidetector CT imaging of the abdomen and pelvis was performed following the standard protocol without IV contrast. RADIATION DOSE REDUCTION: This exam was performed according to the departmental dose-optimization program which includes automated exposure control, adjustment of the mA and/or kV according to patient size  and/or use of iterative reconstruction technique. COMPARISON:  CT abdomen pelvis dated 10/23/2019. FINDINGS: Evaluation of this exam is limited in the absence of intravenous contrast. Lower chest: The visualized lung bases are clear. No intra-abdominal free air or free fluid. Hepatobiliary: Small cyst in the left lobe of the liver. No biliary dilatation. The gallbladder is unremarkable. Pancreas: Unremarkable. No pancreatic ductal dilatation or surrounding inflammatory changes. Spleen: Normal in size without focal abnormality. Adrenals/Urinary Tract: The adrenal glands are unremarkable. Multiple nonobstructing bilateral renal calculi measure up to 4 mm. There is a 7 mm stone at the left ureteropelvic junction. There is mild fullness of the left renal collecting system. No  hydronephrosis on the right. The urinary bladder is unremarkable. Stomach/Bowel: There is moderate stool throughout the colon. There is no bowel obstruction or active inflammation. The appendix is not visualized with certainty. No inflammatory changes identified in the right lower quadrant. Vascular/Lymphatic: Mild aortoiliac atherosclerotic disease. The IVC is unremarkable. No portal venous gas. There is no adenopathy. Reproductive: Small anterior uterine fibroid. No suspicious adnexal masses. Other: None Musculoskeletal: No acute or significant osseous findings. IMPRESSION: 1. A 7 mm left UPJ stone with mild left hydronephrosis. 2. Multiple nonobstructing bilateral renal calculi. 3. No bowel obstruction. 4.  Aortic Atherosclerosis (ICD10-I70.0). Electronically Signed   By: Vanetta Chou M.D.   On: 10/17/2024 12:39     Procedures   Medications Ordered in the ED  ketorolac (TORADOL) 30 MG/ML injection 30 mg (30 mg Intravenous Given 10/17/24 1415)                                    Medical Decision Making Amount and/or Complexity of Data Reviewed Labs: ordered. Radiology: ordered.  Risk Prescription drug management.   This  patient presents to the ED with concern for abdominal pain, nausea. This involves an extensive number of treatment options, and is a complaint that carries with it a high risk of complications and morbidity.  The differential diagnosis includes ureteral colic versus diverticulitis versus other intra-abdominal process   I ordered and personally interpreted labs.  The pertinent results include: UA with hemoglobin, no clear evidence of infection.  Remainder of blood work unremarkable  I ordered imaging studies including CT abdomen pelvis I independently visualized and interpreted imaging which showed left-sided proximal ureteral stone I agree with the radiologist interpretation  The patient was maintained on a cardiac monitor.  I personally viewed and interpreted the cardiac monitored which showed an underlying rhythm of: Sinus rhythm  I ordered medication including Toradol for mild pain  I have reviewed the patients home medicines and have made adjustments as needed  After the interventions noted above, I reevaluated the patient and found that they have: improved  Patient was here for 4 hours with no recurrence of pain or symptoms.  I did discuss with her the possibility that the stone may not pass spontaneously and I encouraged urology follow-up.  Pain and nausea medications were provided at home if she develops recurrent colic.  There is no evidence of sepsis or infection and she is stable for discharge.  She is content to go home.       Final diagnoses:  Ureteral colic    ED Discharge Orders          Ordered    oxyCODONE (ROXICODONE) 5 MG immediate release tablet  Every 6 hours PRN        10/17/24 1420    ondansetron (ZOFRAN-ODT) 4 MG disintegrating tablet  Every 8 hours PRN        10/17/24 1420    tamsulosin (FLOMAX) 0.4 MG CAPS capsule  Daily PRN        10/17/24 1420    ibuprofen (ADVIL) 600 MG tablet  Every 8 hours PRN        10/17/24 1420               Cottie Donnice PARAS, MD 10/17/24 1535

## 2024-10-17 NOTE — Discharge Instructions (Signed)
 You are passing a 7 mm kidney stone on your left side.  This stone may or may not pass on its own.  I do recommend that you call and follow-up with the urologist by calling the office number above.  You can use the medications prescribed as needed for symptoms.  Please read over the attached instructions for return precautions.

## 2024-10-17 NOTE — ED Triage Notes (Signed)
 Pt arrives via [facility] c/o L sided flank pain. Pt states pain started approximately one week ago, but flared overnight. Pt also reports nausea and loose stool this morning. Pt reports hx of CKD and cysts on her L flank pain. Pt also endorses hx of kidney stones several several years ago and states pain felt similar.

## 2024-10-23 ENCOUNTER — Other Ambulatory Visit: Payer: Self-pay | Admitting: Urology

## 2024-10-23 DIAGNOSIS — N201 Calculus of ureter: Secondary | ICD-10-CM | POA: Diagnosis not present

## 2024-10-23 DIAGNOSIS — N202 Calculus of kidney with calculus of ureter: Secondary | ICD-10-CM | POA: Diagnosis not present

## 2024-10-24 DIAGNOSIS — I129 Hypertensive chronic kidney disease with stage 1 through stage 4 chronic kidney disease, or unspecified chronic kidney disease: Secondary | ICD-10-CM | POA: Diagnosis not present

## 2024-10-24 DIAGNOSIS — N182 Chronic kidney disease, stage 2 (mild): Secondary | ICD-10-CM | POA: Diagnosis not present

## 2024-10-24 NOTE — Progress Notes (Signed)
 Office Visit Note  Patient: Taylor Kirby             Date of Birth: 1956/04/25           MRN: [id number]             PCP: Teresa Channel, MD Referring: Teresa Channel, MD Visit Date: 11/07/2024 Occupation: Data Unavailable  Subjective:  Medication management, discuss x-ray results  History of Present Illness: Taylor Kirby is a 68 y.o. female with psoriatic arthritis, osteoarthritis and osteoporosis.  She returns today after her last visit in June 2025.  She states Cosentyx  has been working well for her which she has been taking on a regular basis every 28 days.  She has had no interruption in the treatment.  She denies any joint pain or joint swelling.  She has not had a psoriasis flare.  She has been taking vitamin D  2000 units daily on a regular basis.  She walks for exercise.    Activities of Daily Living:  Patient reports morning stiffness for 0 minutes.   Patient Denies nocturnal pain.  Difficulty dressing/grooming: Denies Difficulty climbing stairs: Denies Difficulty getting out of chair: Denies Difficulty using hands for taps, buttons, cutlery, and/or writing: Reports  Review of Systems  Constitutional:  Negative for fatigue.  HENT:  Negative for mouth sores and mouth dryness.   Eyes:  Negative for dryness.  Respiratory:  Negative for shortness of breath.   Cardiovascular:  Negative for chest pain and palpitations.  Gastrointestinal:  Negative for blood in stool, constipation and diarrhea.  Endocrine: Negative for increased urination.  Genitourinary:  Negative for involuntary urination.  Musculoskeletal:  Negative for joint pain, gait problem, joint pain, joint swelling, myalgias, muscle weakness, morning stiffness, muscle tenderness and myalgias.  Skin:  Positive for hair loss. Negative for color change, rash and sensitivity to sunlight.  Allergic/Immunologic: Negative for susceptible to infections.  Neurological:  Negative for dizziness and headaches.   Hematological:  Negative for swollen glands.  Psychiatric/Behavioral:  Negative for depressed mood and sleep disturbance. The patient is not nervous/anxious.     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   High risk medication use 10/03/2016   Psoriatic arthritis (HCC) 10/25/2015    Past Medical History:  Diagnosis Date   Allergy    Anemia    Chronic kidney disease    Heart murmur    Kidney stone 10/17/2024   Osteoporosis    Pre-diabetes    Rheumatoid arthritis (HCC)     Family History  Problem Relation Age of Onset   Hyperlipidemia Mother    Hypertension Mother    Stroke Mother    Dementia Mother    Hyperlipidemia Father    Cancer Father    Heart attack Father    Hypertension Sister    Heart disease Maternal Grandmother    Cancer Maternal Grandfather    Hypertension Paternal Grandmother    Past Surgical History:  Procedure Laterality Date   EXTRACORPOREAL SHOCK WAVE LITHOTRIPSY Left 10/30/2024   Procedure: LITHOTRIPSY, ESWL;  Surgeon: Carolee Sherwood JONETTA DOUGLAS, MD;  Location: [facility];  Service: Urology;  Laterality: Left;   TUBAL LIGATION     Social History   Tobacco Use   Smoking status: Never    Passive exposure: Never   Smokeless tobacco:  Never  Vaping Use   Vaping status: Never Used  Substance Use Topics   Alcohol use: Yes    Comment: wine occ   Drug use: No   Social History   Social History Narrative   Not on file     Immunization History  Administered Date(s) Administered   Moderna Covid-19 Fall Seasonal Vaccine 77yrs & older 11/11/2023   Moderna SARS-COV2 Booster Vaccination 11/19/2020   Moderna Sars-Covid-2 Vaccination 01/27/2020, 02/24/2020     Objective: Vital Signs: BP 135/80   Pulse 70   Temp 97.6 F (36.4 C)   Resp 14   Ht 5' 4 (1.626 m)   Wt 116 lb 9.6 oz  (52.9 kg)   BMI 20.01 kg/m    Physical Exam Vitals and nursing note reviewed.  Constitutional:      Appearance: She is well-developed.  HENT:     Head: Normocephalic and atraumatic.  Eyes:     Conjunctiva/sclera: Conjunctivae normal.  Cardiovascular:     Rate and Rhythm: Normal rate and regular rhythm.     Heart sounds: Normal heart sounds.  Pulmonary:     Effort: Pulmonary effort is normal.     Breath sounds: Normal breath sounds.  Abdominal:     General: Bowel sounds are normal.     Palpations: Abdomen is soft.  Musculoskeletal:     Cervical back: Normal range of motion.  Lymphadenopathy:     Cervical: No cervical adenopathy.  Skin:    General: Skin is warm and dry.     Capillary Refill: Capillary refill takes less than 2 seconds.  Neurological:     Mental Status: She is alert and oriented to person, place, and time.  Psychiatric:        Behavior: Behavior normal.      Musculoskeletal Exam: She had limited lateral rotation of the cervical spine.  She has limited range of motion of her lumbar spine with forward flexion.  There was no SI joint tenderness.  Shoulder joints were in good range of motion.  She had right elbow contracture without synovitis which was unchanged.  She had limited extension of the bilateral wrist joints without synovitis.  Arthritis maintenance was noted of bilateral thumbs with telescoping of the digit.  She had subluxation of some of the PIP joints in her right hand.  No synovitis was noted.  Hip joints and knee joints were in good range of motion without any warmth swelling or effusion.  There was no plantar fasciitis or Achilles tendinitis.  There was no tenderness over ankles or MTPs.   CDAI Exam: CDAI Score: -- Patient Global: --; Provider Global: -- Swollen: --; Tender: -- Joint Exam 11/07/2024   No joint exam has been documented for this visit   There is currently no information documented on the homunculus. Go to the Rheumatology activity  and complete the homunculus joint exam.  Investigation: No additional findings.  Imaging: DG Abd 1 View Result Date: 10/30/2024 EXAM: 1 VIEW XRAY OF THE ABDOMEN 10/30/2024 07:10:00 AM COMPARISON: CT 10/17/2024. CLINICAL HISTORY: Ureteral stone FINDINGS: BOWEL: Nonobstructive bowel gas pattern. SOFT TISSUES: A 6 mm calculus overlies the expected course of the left mid ureter, located between the L4 and L5 left transverse processes. An upper pole right kidney stone measures 3 mm. A lower pole left kidney stone measures 4 mm. Pelvic phleboliths noted. BONES: No acute osseous abnormality. IMPRESSION: 1. 6 mm calculus overlying the expected course of the left mid ureter between the L4 and L5  left transverse processes. 2. Upper pole right kidney stone measuring 3 mm. 3. Lower pole left kidney stone measuring 4 mm. Electronically signed by: Waddell Calk MD 10/30/2024 07:28 AM EST RP Workstation: [id number]   CT ABDOMEN PELVIS WO CONTRAST Result Date: 10/17/2024 CLINICAL DATA:  Left lower quadrant abdominal pain. Concern for acute diverticulitis. EXAM: CT ABDOMEN AND PELVIS WITHOUT CONTRAST TECHNIQUE: Multidetector CT imaging of the abdomen and pelvis was performed following the standard protocol without IV contrast. RADIATION DOSE REDUCTION: This exam was performed according to the departmental dose-optimization program which includes automated exposure control, adjustment of the mA and/or kV according to patient size and/or use of iterative reconstruction technique. COMPARISON:  CT abdomen pelvis dated 10/23/2019. FINDINGS: Evaluation of this exam is limited in the absence of intravenous contrast. Lower chest: The visualized lung bases are clear. No intra-abdominal free air or free fluid. Hepatobiliary: Small cyst in the left lobe of the liver. No biliary dilatation. The gallbladder is unremarkable. Pancreas: Unremarkable. No pancreatic ductal dilatation or surrounding inflammatory changes. Spleen: Normal in  size without focal abnormality. Adrenals/Urinary Tract: The adrenal glands are unremarkable. Multiple nonobstructing bilateral renal calculi measure up to 4 mm. There is a 7 mm stone at the left ureteropelvic junction. There is mild fullness of the left renal collecting system. No hydronephrosis on the right. The urinary bladder is unremarkable. Stomach/Bowel: There is moderate stool throughout the colon. There is no bowel obstruction or active inflammation. The appendix is not visualized with certainty. No inflammatory changes identified in the right lower quadrant. Vascular/Lymphatic: Mild aortoiliac atherosclerotic disease. The IVC is unremarkable. No portal venous gas. There is no adenopathy. Reproductive: Small anterior uterine fibroid. No suspicious adnexal masses. Other: None Musculoskeletal: No acute or significant osseous findings. IMPRESSION: 1. A 7 mm left UPJ stone with mild left hydronephrosis. 2. Multiple nonobstructing bilateral renal calculi. 3. No bowel obstruction. 4.  Aortic Atherosclerosis (ICD10-I70.0). Electronically Signed   By: Vanetta Chou M.D.   On: 10/17/2024 12:39    Recent Labs: Lab Results  Component Value Date   WBC 5.0 10/17/2024   HGB 15.0 10/17/2024   PLT 286 10/17/2024   NA 141 10/17/2024   K 3.7 10/17/2024   CL 103 10/17/2024   CO2 29 10/17/2024   GLUCOSE 114 (H) 10/17/2024   BUN 17 10/17/2024   CREATININE 0.84 10/17/2024   BILITOT 0.5 10/17/2024   ALKPHOS 88 10/17/2024   AST 27 10/17/2024   ALT 16 10/17/2024   PROT 8.4 (H) 10/17/2024   ALBUMIN 4.4 10/17/2024   CALCIUM 10.5 (H) 10/17/2024   GFRAA 85 04/10/2021   QFTBGOLDPLUS NEGATIVE 03/23/2024    February 02, 2024 DEXA scan T-score -2.3, BMD 0.799 lumbar spine -3% change in the right total femur -7%, change in the left total femur, -4% change in the AP spine  Speciality Comments: Discontinued methotrexate  September 2021 due to low WBC Fosamax  March 2019 till March 2023 with few gaps.  Procedures:   No procedures performed Allergies: Erythromycin, Penicillins, and Sulfa antibiotics   Assessment / Plan:     Visit Diagnoses: Psoriatic arthritis (HCC) - Hx of arthritis mutilans-Bilateral thumbs: No synovitis, tenosynovitis, dactylitis, Achilles tendinitis or plantar fasciitis noted.  She denies any history of iritis.  She has been tolerating Cosentyx  without any side effects.  She has been taking Cosentyx  300 mg subcu every 28 days without any interruption.  She denies any history of joint pain or joint swelling.  Other psoriasis-she denies any active psoriasis lesions.  High  risk medication use - Cosentyx  300 mg sq injections every 28 days. Initiated Cosentyx  on 06/30/22.  October 17, 2024 CBC and CMP were normal except calcium was elevated at 10.5.  She was advised to reduce vitamin D  to 1000 units daily.  Will recheck labs in February.  TB Gold was negative in April 2025.  She was advised to have repeat TB Gold in May 2025.  Information reimmunization was placed in the AVS.  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  Age-related osteoporosis without current pathological fracture - Fosamax  d/c March 2023 after her repeat DEXA scan. February 02, 2024 DEXA scan T-score -2.3, BMD 0.799 lumbar spine -3% change in the right total femur -7%, change in the left total femur, -4% change in the AP spine.  DEXA 01/28/2022: Lumbar spine BMD 0.831 with T score -2.1 -DEXA results were reviewed with the patient.  Plan: alendronate  (FOSAMAX ) 70 MG tablet.  She had been off Fosamax  for 2-1/2 years.  We discussed restarting Fosamax .  Patient was in agreement.  A prescription for Fosamax  70 mg p.o. weekly with instructions was sent.  Side effects were reviewed at length.  A handout was placed in the AVS.  Patient has taken Fosamax  in the past from March 2019 until March  2023 with few gaps.  She is comfortable restarting Fosamax .  Resistive exercises were discussed.  Vitamin D  deficiency-she been taking vitamin D  2000 units daily.  Advised her to reduce vitamin D  to 1000 units daily as her calcium was mildly elevated.  Renal calcinosis  Vitamin B12 deficiency  Essential hypertension-blood pressure was normal at 135/80.  History of anemia  Stress-patient states she had weight loss due to stress.  She has been going for counseling.  Her mother has dementia was living with her.  She has been under a lot of stress.  Relaxation techniques were discussed.  Regular exercise was emphasized.  Orders: Orders Placed This Encounter  Procedures   QuantiFERON-TB Gold Plus   Meds ordered this encounter  Medications   alendronate  (FOSAMAX ) 70 MG tablet    Sig: Take 1 tablet (70 mg total) by mouth once a week. Take with a full glass of water on an empty stomach.    Dispense:  12 tablet    Refill:  1     Follow-Up Instructions: Return in about 5 months (around 04/07/2025) for Psoriatic arthritis.   Maya Nash, MD  Note - This record has been created using Animal nutritionist.  Chart creation errors have been sought, but may not always  have been located. Such creation errors do not reflect on  the standard of medical care.

## 2024-10-27 ENCOUNTER — Encounter (HOSPITAL_COMMUNITY): Payer: Self-pay | Admitting: Urology

## 2024-10-27 NOTE — Progress Notes (Signed)
 [facility] PREOP PHONE CALL   ALLERGIES REVIEWED: YES  MEDICATION REVIEW DONE: YES MEDICATIONS THAT PT SHOULD HOLD (LIST): NSAIDS hold 48hr  CAN PT WALK UP STAIRS WITHOUT SHORTNESS OF BREATH: YES HOME O2: NO CPAP: NO  IF YES, INFORMED PT TO BRING CPAP WITH TUBING AND MASK:YES/NO   INFORMED DRIVER NEEDED FOR PROCEDURE: YES   PT WAS GIVEN BLUE FOLDER AT UROLOGY APPT: YES PT INFORMED TO BRING BLUE FOLDER WITH ALL CONTENTS: YES  REVIEWED ARRIVAL TIME AND LOCATION: YES  OTHER PERTINENT INFORMATION:

## 2024-10-27 NOTE — Progress Notes (Signed)
 Attempted to obtain medical history for pre op call via telephone, unable to reach at this time. HIPAA compliant voicemail message left requesting return call to pre surgical testing department.

## 2024-10-29 DIAGNOSIS — M810 Age-related osteoporosis without current pathological fracture: Secondary | ICD-10-CM | POA: Diagnosis not present

## 2024-10-29 DIAGNOSIS — I129 Hypertensive chronic kidney disease with stage 1 through stage 4 chronic kidney disease, or unspecified chronic kidney disease: Secondary | ICD-10-CM | POA: Diagnosis not present

## 2024-10-29 DIAGNOSIS — N182 Chronic kidney disease, stage 2 (mild): Secondary | ICD-10-CM | POA: Diagnosis not present

## 2024-10-29 DIAGNOSIS — F4321 Adjustment disorder with depressed mood: Secondary | ICD-10-CM | POA: Diagnosis not present

## 2024-10-29 DIAGNOSIS — L4050 Arthropathic psoriasis, unspecified: Secondary | ICD-10-CM | POA: Diagnosis not present

## 2024-10-30 ENCOUNTER — Ambulatory Visit (HOSPITAL_COMMUNITY)

## 2024-10-30 ENCOUNTER — Other Ambulatory Visit: Payer: Self-pay

## 2024-10-30 ENCOUNTER — Ambulatory Visit (HOSPITAL_COMMUNITY)
Admission: RE | Admit: 2024-10-30 | Discharge: 2024-10-30 | Disposition: A | Discharge status: Home / Self Care | Attending: Urology | Admitting: Urology

## 2024-10-30 ENCOUNTER — Encounter (HOSPITAL_COMMUNITY): Payer: Self-pay | Admitting: Urology

## 2024-10-30 ENCOUNTER — Encounter (HOSPITAL_COMMUNITY)
Admission: RE | Disposition: A | Discharge status: Home / Self Care | Payer: Self-pay | Source: Home / Self Care | Attending: Urology

## 2024-10-30 DIAGNOSIS — N201 Calculus of ureter: Secondary | ICD-10-CM | POA: Diagnosis not present

## 2024-10-30 DIAGNOSIS — I878 Other specified disorders of veins: Secondary | ICD-10-CM | POA: Diagnosis not present

## 2024-10-30 DIAGNOSIS — N200 Calculus of kidney: Secondary | ICD-10-CM | POA: Diagnosis not present

## 2024-10-30 HISTORY — PX: EXTRACORPOREAL SHOCK WAVE LITHOTRIPSY: SHX1557

## 2024-10-30 HISTORY — DX: Prediabetes: R73.03

## 2024-10-30 LAB — GLUCOSE, CAPILLARY: Glucose-Capillary: 73 mg/dL (ref 70–99)

## 2024-10-30 SURGERY — LITHOTRIPSY, ESWL
Anesthesia: LOCAL | Laterality: Left

## 2024-10-30 MED ORDER — HYDROCODONE-ACETAMINOPHEN 5-325 MG PO TABS: 1.0000 | ORAL_TABLET | Freq: Four times a day (QID) | ORAL | 0 refills | Status: AC | PRN

## 2024-10-30 MED ORDER — DIAZEPAM 5 MG PO TABS
10.0000 mg | ORAL_TABLET | ORAL | Status: AC
  Administered 2024-10-30: 10 mg via ORAL
  Filled 2024-10-30: qty 2

## 2024-10-30 MED ORDER — CIPROFLOXACIN HCL 500 MG PO TABS
500.0000 mg | ORAL_TABLET | ORAL | Status: AC
  Administered 2024-10-30: 500 mg via ORAL
  Filled 2024-10-30: qty 1

## 2024-10-30 MED ORDER — SODIUM CHLORIDE 0.9 % IV SOLN: INTRAVENOUS | Status: DC

## 2024-10-30 MED ORDER — DIPHENHYDRAMINE HCL 25 MG PO CAPS
25.0000 mg | ORAL_CAPSULE | ORAL | Status: AC
  Administered 2024-10-30: 25 mg via ORAL
  Filled 2024-10-30: qty 1

## 2024-10-30 NOTE — H&P (Signed)
 See scanned H&P

## 2024-10-30 NOTE — Op Note (Signed)
 See [employer] OP note scanned into chart. Also because of the size, density, location and other factors that cannot be anticipated I feel this will likely be a staged procedure. This fact supersedes any indication in the scanned [place] [facility] operative note to the contrary.

## 2024-10-31 ENCOUNTER — Encounter (HOSPITAL_COMMUNITY): Payer: Self-pay | Admitting: Urology

## 2024-11-01 ENCOUNTER — Other Ambulatory Visit: Payer: Self-pay

## 2024-11-01 NOTE — Progress Notes (Signed)
 Specialty Pharmacy Refill Coordination Note  Taylor Kirby is a 68 y.o. female contacted today regarding refills of specialty medication(s) Secukinumab  (Cosentyx  Sensoready (300 MG))   Patient requested Delivery   Delivery date: 11/10/24   Verified address: [address]  [place] [address]   Medication will be filled on: 11/09/24

## 2024-11-07 ENCOUNTER — Encounter: Payer: Self-pay | Admitting: Rheumatology

## 2024-11-07 ENCOUNTER — Ambulatory Visit: Attending: Rheumatology | Admitting: Rheumatology

## 2024-11-07 VITALS — BP 135/80 | HR 70 | Temp 97.6°F | Resp 14 | Ht 64.0 in | Wt 116.6 lb

## 2024-11-07 DIAGNOSIS — E538 Deficiency of other specified B group vitamins: Secondary | ICD-10-CM

## 2024-11-07 DIAGNOSIS — M24521 Contracture, right elbow: Secondary | ICD-10-CM | POA: Diagnosis not present

## 2024-11-07 DIAGNOSIS — L408 Other psoriasis: Secondary | ICD-10-CM

## 2024-11-07 DIAGNOSIS — I10 Essential (primary) hypertension: Secondary | ICD-10-CM | POA: Diagnosis not present

## 2024-11-07 DIAGNOSIS — Z79899 Other long term (current) drug therapy: Secondary | ICD-10-CM

## 2024-11-07 DIAGNOSIS — F439 Reaction to severe stress, unspecified: Secondary | ICD-10-CM

## 2024-11-07 DIAGNOSIS — E559 Vitamin D deficiency, unspecified: Secondary | ICD-10-CM | POA: Diagnosis not present

## 2024-11-07 DIAGNOSIS — L4050 Arthropathic psoriasis, unspecified: Secondary | ICD-10-CM

## 2024-11-07 DIAGNOSIS — M810 Age-related osteoporosis without current pathological fracture: Secondary | ICD-10-CM | POA: Diagnosis not present

## 2024-11-07 DIAGNOSIS — Z862 Personal history of diseases of the blood and blood-forming organs and certain disorders involving the immune mechanism: Secondary | ICD-10-CM

## 2024-11-07 MED ORDER — ALENDRONATE SODIUM 70 MG PO TABS: 70.0000 mg | ORAL_TABLET | ORAL | 1 refills | Status: AC

## 2024-11-07 NOTE — Patient Instructions (Addendum)
 Standing Labs We placed an order today for your standing lab work.   Please have your standing labs drawn in February and every 3 months. TB Gold in May 2026  Please have your labs drawn 2 weeks prior to your appointment so that the provider can discuss your lab results at your appointment, if possible.  Please note that you may see your imaging and lab results in MyChart before we have reviewed them. We will contact you once all results are reviewed. Please allow our office up to 72 hours to thoroughly review all of the results before contacting the office for clarification of your results.  WALK-IN LAB HOURS  Monday through Thursday from 8:00 am - 4:30 pm and Friday from 8:00 am-12:00 pm.  Patients with office visits requiring labs will be seen before walk-in labs.  You may encounter longer than normal wait times. Please allow additional time. Wait times may be shorter on  Monday and Thursday afternoons.  We do not book appointments for walk-in labs. We appreciate your patience and understanding with our staff.   Labs are drawn by [facility]. Please bring your co-pay at the time of your lab draw.  You may receive a bill from [facility] for your lab work.  Please note if you are on Hydroxychloroquine and and an order has been placed for a Hydroxychloroquine level,  you will need to have it drawn 4 hours or more after your last dose.  If you wish to have your labs drawn at another location, please call the office 24 hours in advance so we can fax the orders.  The office is located at [address], [address], [place], [place] [address]   If you have any questions regarding directions or hours of operation,  please call [phone].   As a reminder, please drink plenty of water prior to coming for your lab work. Thanks!  Vaccines You are taking a medication(s) that can suppress your immune system.  The following immunizations are recommended: Flu annually Covid-19  RSV Td/Tdap (tetanus,  diphtheria, pertussis) every 10 years Pneumonia (Prevnar 15 then Pneumovax 23 at least 1 year apart.  Alternatively, can take Prevnar 20 without needing additional dose) Shingrix: 2 doses from 4 weeks to 6 months apart  Please check with your PCP to make sure you are up to date.   If you have signs or symptoms of an infection or start antibiotics: First, call your PCP for workup of your infection. Hold your medication through the infection, until you complete your antibiotics, and until symptoms resolve if you take the following: Injectable medication (Actemra, Benlysta, Cimzia, Cosentyx , Enbrel , Humira, Kevzara, Orencia, Remicade, Simponi, Stelara, Taltz, Tremfya) Methotrexate  Leflunomide (Arava) Mycophenolate (Cellcept) Xeljanz, Olumiant, or Rinvoq   Alendronate  Tablets What is this medication? ALENDRONATE  (a LEN droe nate) prevents and treats osteoporosis. It may also be used to treat Paget disease of the bone. It works by interior and spatial designer stronger and less likely to break (fracture). It belongs to a group of medications called bisphosphonates. This medicine may be used for other purposes; ask your health care provider or pharmacist if you have questions. COMMON BRAND NAME(S): Fosamax  What should I tell my care team before I take this medication? They need to know if you have any of these conditions: Bleeding disorder Cancer Dental disease Difficulty swallowing Infection (fever, chills, cough, sore throat, pain or trouble passing urine) Kidney disease Low levels of calcium or other minerals in the blood Low red blood cell counts  Receiving steroids like dexamethasone or prednisone  Stomach or intestine problems Trouble sitting or standing for 30 minutes An unusual or allergic reaction to alendronate , other medications, foods, dyes or preservatives Pregnant or trying to get pregnant Breast-feeding How should I use this medication? Take this medication by mouth with a full glass  of water. Take it as directed on the prescription label at the same time every day. Take the dose right after waking up. Do not eat or drink anything before taking it. Do not take it with any other drink except water. Do not chew or crush the tablet. After taking it, do not eat breakfast, drink, or take any other medications or vitamins for at least 30 minutes. Sit or stand up for at least 30 minutes after you take it. Do not lie down. Keep taking it unless your care team tells you to stop. A special MedGuide will be given to you by the pharmacist with each prescription and refill. Be sure to read this information carefully each time. Talk to your care team about the use of this medication in children. Special care may be needed. Overdosage: If you think you have taken too much of this medicine contact a poison control center or emergency room at once. NOTE: This medicine is only for you. Do not share this medicine with others. What if I miss a dose? If you take your medication once a day, skip it. Take your next dose at the scheduled time the next morning. Do not take two doses on the same day. If you take your medication once a week, take the missed dose on the morning after you remember. Do not take two doses on the same day. What may interact with this medication? Aluminum hydroxide Antacids Aspirin Calcium supplements Medications for inflammation like ibuprofen , naproxen, and others Iron supplements Magnesium supplements Vitamins with minerals This list may not describe all possible interactions. Give your health care provider a list of all the medicines, herbs, non-prescription drugs, or dietary supplements you use. Also tell them if you smoke, drink alcohol, or use illegal drugs. Some items may interact with your medicine. What should I watch for while using this medication? Visit your care team for regular checks on your progress. It may be some time before you see the benefit from this  medication. Some people who take this medication have severe bone, joint, or muscle pain. This medication may also increase your risk for jaw problems or a broken thigh bone. Tell your care team right away if you have severe pain in your jaw, bones, joints, or muscles. Tell you care team if you have any pain that does not go away or that gets worse. Tell your dentist and dental surgeon that you are taking this medication. You should not have major dental surgery while on this medication. See your dentist to have a dental exam and fix any dental problems before starting this medication. Take good care of your teeth while on this medication. Make sure you see your dentist for regular follow-up appointments. You should make sure you get enough calcium and vitamin D  while you are taking this medication. Discuss the foods you eat and the vitamins you take with your care team. You may need blood work done while you are taking this medication. What side effects may I notice from receiving this medication? Side effects that you should report to your care team as soon as possible: Allergic reactions--skin rash, itching, hives, swelling of the face, lips, tongue,  or throat Low calcium level--muscle pain or cramps, confusion, tingling, or numbness in the hands or feet Osteonecrosis of the jaw--pain, swelling, or redness in the mouth, numbness of the jaw, poor healing after dental work, unusual discharge from the mouth, visible bones in the mouth Pain or trouble swallowing Severe bone, joint, or muscle pain Stomach bleeding--bloody or black, tar-like stools, vomiting blood or brown material that looks like coffee grounds Side effects that usually do not require medical attention (report to your care team if they continue or are bothersome): Constipation Diarrhea Nausea Stomach pain This list may not describe all possible side effects. Call your doctor for medical advice about side effects. You may report side  effects to FDA at 1-800-FDA-1088. Where should I keep my medication? Keep out of the reach of children and pets. Store at room temperature between 15 and 30 degrees C (59 and 86 degrees F). Throw away any unused medication after the expiration date. NOTE: This sheet is a summary. It may not cover all possible information. If you have questions about this medicine, talk to your doctor, pharmacist, or health care provider.  2024 Elsevier/Gold Standard (2020-11-28 00:00:00)

## 2024-11-09 ENCOUNTER — Other Ambulatory Visit: Payer: Self-pay

## 2024-11-09 ENCOUNTER — Telehealth: Payer: Self-pay | Admitting: Pharmacist

## 2024-11-09 NOTE — Telephone Encounter (Signed)
 Submitted a Prior Authorization request to [employer] for COSENTYX  SQ via CoverMyMeds. Will update once we receive a response.  Key: [id number]    The pharmacy has received a paid claim for the submitted date of service. Please review and if needed, resubmit after 7 days.

## 2024-11-29 ENCOUNTER — Other Ambulatory Visit (HOSPITAL_COMMUNITY): Payer: Self-pay

## 2024-12-01 ENCOUNTER — Other Ambulatory Visit: Payer: Self-pay | Admitting: Physician Assistant

## 2024-12-01 ENCOUNTER — Other Ambulatory Visit (HOSPITAL_COMMUNITY): Payer: Self-pay

## 2024-12-01 ENCOUNTER — Other Ambulatory Visit: Payer: Self-pay

## 2024-12-01 DIAGNOSIS — L408 Other psoriasis: Secondary | ICD-10-CM

## 2024-12-01 DIAGNOSIS — L4050 Arthropathic psoriasis, unspecified: Secondary | ICD-10-CM

## 2024-12-01 DIAGNOSIS — Z79899 Other long term (current) drug therapy: Secondary | ICD-10-CM

## 2024-12-01 MED ORDER — COSENTYX SENSOREADY (300 MG) 150 MG/ML ~~LOC~~ SOAJ
SUBCUTANEOUS | 0 refills | Status: AC
  Filled 2024-12-01: qty 6, fill #0
  Filled 2024-12-05: qty 2, 28d supply, fill #0
  Filled 2024-12-27: qty 2, 28d supply, fill #1

## 2024-12-01 NOTE — Telephone Encounter (Signed)
 Last Fill: 09/04/2024  Labs: 10/17/2024 Glucose 114 Calcium 10.5 Total Protein 8.4 RBC 6.12 HCT 48.7 MCV 79.6 MCH 24.5 RDW 17.3  TB Gold: 03/23/2024 TB gold negative   Next Visit: 04/09/2025  Last Visit: 11/07/2024  IK:Ednmpjupr arthritis (HCC)   Current Dose per office note 11/07/2024: Cosentyx  300 mg sq injections every 28 days.   Okay to refill Cosentyx ?

## 2024-12-05 ENCOUNTER — Other Ambulatory Visit: Payer: Self-pay

## 2024-12-06 ENCOUNTER — Other Ambulatory Visit: Payer: Self-pay

## 2024-12-06 NOTE — Progress Notes (Signed)
 Specialty Pharmacy Refill Coordination Note  Taylor Kirby is a 69 y.o. female contacted today regarding refills of specialty medication(s) Secukinumab  (Cosentyx  Sensoready (300 MG))   Patient requested Delivery   Delivery date: 12/07/24   Verified address: [address]  [place]  [address]   Medication will be filled on: 12/06/24

## 2024-12-27 ENCOUNTER — Other Ambulatory Visit: Payer: Self-pay

## 2024-12-28 ENCOUNTER — Other Ambulatory Visit: Payer: Self-pay

## 2024-12-28 ENCOUNTER — Encounter (INDEPENDENT_AMBULATORY_CARE_PROVIDER_SITE_OTHER): Payer: Self-pay

## 2024-12-28 NOTE — Progress Notes (Signed)
 Specialty Pharmacy Refill Coordination Note  Taylor Kirby is a 69 y.o. female contacted today regarding refills of specialty medication(s) Secukinumab  (Cosentyx  Sensoready (300 MG))   Patient requested Delivery   Delivery date: 12/29/24   Verified address: [address]   [place], [place]  [address]   Medication will be filled on: 12/28/24

## 2025-04-09 ENCOUNTER — Ambulatory Visit: Admitting: Rheumatology
